# Patient Record
Sex: Female | Born: 1935 | Race: White | Hispanic: No | Smoking: Never smoker
Health system: Southern US, Community
[De-identification: ages and names within clinical notes are randomized; demographics above are authoritative.]

## PROBLEM LIST (undated history)

## (undated) DIAGNOSIS — F329 Major depressive disorder, single episode, unspecified: Secondary | ICD-10-CM

## (undated) DIAGNOSIS — M545 Low back pain, unspecified: Secondary | ICD-10-CM

## (undated) DIAGNOSIS — E785 Hyperlipidemia, unspecified: Secondary | ICD-10-CM

## (undated) DIAGNOSIS — K219 Gastro-esophageal reflux disease without esophagitis: Secondary | ICD-10-CM

## (undated) DIAGNOSIS — F32A Depression, unspecified: Secondary | ICD-10-CM

## (undated) DIAGNOSIS — I10 Essential (primary) hypertension: Secondary | ICD-10-CM

## (undated) DIAGNOSIS — R112 Nausea with vomiting, unspecified: Secondary | ICD-10-CM

## (undated) DIAGNOSIS — Z9889 Other specified postprocedural states: Secondary | ICD-10-CM

## (undated) DIAGNOSIS — G8929 Other chronic pain: Secondary | ICD-10-CM

## (undated) DIAGNOSIS — M1990 Unspecified osteoarthritis, unspecified site: Secondary | ICD-10-CM

## (undated) HISTORY — DX: Low back pain, unspecified: M54.50

## (undated) HISTORY — DX: Unspecified osteoarthritis, unspecified site: M19.90

## (undated) HISTORY — DX: Other chronic pain: G89.29

## (undated) HISTORY — DX: Major depressive disorder, single episode, unspecified: F32.9

## (undated) HISTORY — PX: EYE SURGERY: SHX253

## (undated) HISTORY — DX: Essential (primary) hypertension: I10

## (undated) HISTORY — DX: Depression, unspecified: F32.A

## (undated) HISTORY — PX: BACK SURGERY: SHX140

## (undated) HISTORY — DX: Low back pain: M54.5

## (undated) HISTORY — PX: KNEE SURGERY: SHX244

## (undated) HISTORY — DX: Hyperlipidemia, unspecified: E78.5

---

## 2005-07-25 ENCOUNTER — Ambulatory Visit: Payer: Self-pay | Admitting: Internal Medicine

## 2005-07-26 ENCOUNTER — Ambulatory Visit: Payer: Self-pay | Admitting: Internal Medicine

## 2005-07-26 LAB — CONVERTED CEMR LAB: TSH: 3.17 microintl units/mL

## 2005-08-08 ENCOUNTER — Ambulatory Visit: Payer: Self-pay | Admitting: Internal Medicine

## 2005-08-29 ENCOUNTER — Ambulatory Visit: Payer: Self-pay | Admitting: Internal Medicine

## 2005-09-07 ENCOUNTER — Ambulatory Visit: Payer: Self-pay | Admitting: Internal Medicine

## 2006-01-03 ENCOUNTER — Ambulatory Visit: Payer: Self-pay | Admitting: Internal Medicine

## 2006-01-21 ENCOUNTER — Ambulatory Visit: Payer: Self-pay | Admitting: Internal Medicine

## 2006-01-21 ENCOUNTER — Inpatient Hospital Stay (HOSPITAL_COMMUNITY): Admission: RE | Admit: 2006-01-21 | Discharge: 2006-01-24 | Payer: Self-pay | Admitting: Orthopedic Surgery

## 2006-01-31 ENCOUNTER — Ambulatory Visit: Payer: Self-pay | Admitting: Internal Medicine

## 2006-02-14 ENCOUNTER — Encounter: Admission: RE | Admit: 2006-02-14 | Discharge: 2006-03-13 | Payer: Self-pay | Admitting: Orthopedic Surgery

## 2006-02-28 ENCOUNTER — Ambulatory Visit: Payer: Self-pay | Admitting: Internal Medicine

## 2006-02-28 LAB — CONVERTED CEMR LAB
ALT: 19 units/L (ref 0–40)
BUN: 20 mg/dL (ref 6–23)
Calcium: 9.8 mg/dL (ref 8.4–10.5)
Chloride: 108 meq/L (ref 96–112)
Chol/HDL Ratio, serum: 5.1
Cholesterol: 216 mg/dL (ref 0–200)
GFR calc non Af Amer: 85 mL/min
Glomerular Filtration Rate, Af Am: 103 mL/min/{1.73_m2}
Glucose, Bld: 97 mg/dL (ref 70–99)
LDL DIRECT: 132.5 mg/dL
VLDL: 24 mg/dL (ref 0–40)

## 2006-03-05 ENCOUNTER — Ambulatory Visit: Payer: Self-pay | Admitting: Internal Medicine

## 2006-05-16 ENCOUNTER — Ambulatory Visit: Payer: Self-pay | Admitting: Internal Medicine

## 2006-05-16 LAB — CONVERTED CEMR LAB
Basophils Relative: 1 % (ref 0.0–1.0)
Eosinophils Absolute: 0.3 10*3/uL (ref 0.0–0.6)
Lymphocytes Relative: 32.9 % (ref 12.0–46.0)
Monocytes Relative: 14.6 % — ABNORMAL HIGH (ref 3.0–11.0)
Neutro Abs: 2.3 10*3/uL (ref 1.4–7.7)
Platelets: 258 10*3/uL (ref 150–400)
RBC: 4.3 M/uL (ref 3.87–5.11)

## 2006-05-22 ENCOUNTER — Ambulatory Visit: Payer: Self-pay | Admitting: Internal Medicine

## 2006-08-01 ENCOUNTER — Encounter: Payer: Self-pay | Admitting: Internal Medicine

## 2006-08-01 LAB — CONVERTED CEMR LAB: aPTT: 22.2 s

## 2006-08-16 ENCOUNTER — Ambulatory Visit: Payer: Self-pay | Admitting: Internal Medicine

## 2006-08-16 LAB — CONVERTED CEMR LAB
AST: 24 units/L (ref 0–37)
Bilirubin, Direct: 0.1 mg/dL (ref 0.0–0.3)
Chloride: 107 meq/L (ref 96–112)
Cholesterol: 191 mg/dL (ref 0–200)
Creatinine, Ser: 0.8 mg/dL (ref 0.4–1.2)
Glucose, Bld: 95 mg/dL (ref 70–99)
HDL: 46.2 mg/dL (ref >39.0)
Hgb A1c MFr Bld: 5.5 % (ref 4.6–6.0)
LDL Cholesterol: 120 mg/dL — ABNORMAL HIGH (ref 0–99)
Sodium: 139 meq/L (ref 135–145)
Total Bilirubin: 0.6 mg/dL (ref 0.3–1.2)
Total Protein: 7.2 g/dL (ref 6.0–8.3)
Triglycerides: 123 mg/dL (ref 0–149)

## 2006-08-21 ENCOUNTER — Ambulatory Visit (HOSPITAL_COMMUNITY): Admission: RE | Admit: 2006-08-21 | Discharge: 2006-08-21 | Payer: Self-pay | Admitting: Internal Medicine

## 2006-08-21 ENCOUNTER — Ambulatory Visit: Payer: Self-pay | Admitting: Internal Medicine

## 2006-09-12 ENCOUNTER — Ambulatory Visit: Payer: Self-pay | Admitting: Family Medicine

## 2006-10-03 ENCOUNTER — Ambulatory Visit: Payer: Self-pay | Admitting: Internal Medicine

## 2006-11-19 ENCOUNTER — Ambulatory Visit: Payer: Self-pay | Admitting: Internal Medicine

## 2007-01-17 ENCOUNTER — Encounter: Payer: Self-pay | Admitting: Internal Medicine

## 2007-01-17 DIAGNOSIS — M1990 Unspecified osteoarthritis, unspecified site: Secondary | ICD-10-CM | POA: Insufficient documentation

## 2007-01-17 DIAGNOSIS — I10 Essential (primary) hypertension: Secondary | ICD-10-CM | POA: Insufficient documentation

## 2007-01-17 DIAGNOSIS — F329 Major depressive disorder, single episode, unspecified: Secondary | ICD-10-CM | POA: Insufficient documentation

## 2007-01-17 DIAGNOSIS — E785 Hyperlipidemia, unspecified: Secondary | ICD-10-CM

## 2007-02-04 ENCOUNTER — Ambulatory Visit: Payer: Self-pay | Admitting: Internal Medicine

## 2007-03-12 ENCOUNTER — Telehealth: Payer: Self-pay | Admitting: Internal Medicine

## 2007-05-09 ENCOUNTER — Ambulatory Visit: Payer: Self-pay | Admitting: Internal Medicine

## 2007-05-09 DIAGNOSIS — J069 Acute upper respiratory infection, unspecified: Secondary | ICD-10-CM | POA: Insufficient documentation

## 2007-06-26 ENCOUNTER — Telehealth: Payer: Self-pay | Admitting: Internal Medicine

## 2007-10-03 ENCOUNTER — Ambulatory Visit: Payer: Self-pay | Admitting: Internal Medicine

## 2007-12-01 ENCOUNTER — Ambulatory Visit: Payer: Self-pay | Admitting: Internal Medicine

## 2007-12-01 DIAGNOSIS — R5383 Other fatigue: Secondary | ICD-10-CM

## 2007-12-01 DIAGNOSIS — R3915 Urgency of urination: Secondary | ICD-10-CM

## 2007-12-01 DIAGNOSIS — R5381 Other malaise: Secondary | ICD-10-CM | POA: Insufficient documentation

## 2007-12-01 LAB — CONVERTED CEMR LAB
Glucose, Urine, Semiquant: NEGATIVE
Ketones, urine, test strip: NEGATIVE
Urobilinogen, UA: 0.2
pH: 5

## 2007-12-30 ENCOUNTER — Ambulatory Visit: Payer: Self-pay | Admitting: Internal Medicine

## 2007-12-30 LAB — CONVERTED CEMR LAB
Basophils Absolute: 0.1 10*3/uL (ref 0.0–0.1)
Basophils Relative: 1.1 % (ref 0.0–3.0)
CO2: 26 meq/L (ref 19–32)
Chloride: 109 meq/L (ref 96–112)
Creatinine, Ser: 0.8 mg/dL (ref 0.4–1.2)
Lymphocytes Relative: 38.5 % (ref 12.0–46.0)
MCHC: 33.8 g/dL (ref 30.0–36.0)
Neutrophils Relative %: 38.7 % — ABNORMAL LOW (ref 43.0–77.0)
RBC: 4.14 M/uL (ref 3.87–5.11)
TSH: 4.13 microintl units/mL (ref 0.35–5.50)
Vitamin B-12: 256 pg/mL (ref 211–911)
WBC: 5.3 10*3/uL (ref 4.5–10.5)

## 2008-01-05 ENCOUNTER — Ambulatory Visit: Payer: Self-pay | Admitting: Internal Medicine

## 2008-01-05 DIAGNOSIS — M545 Low back pain: Secondary | ICD-10-CM

## 2008-01-06 ENCOUNTER — Ambulatory Visit: Payer: Self-pay | Admitting: Internal Medicine

## 2008-01-06 LAB — CONVERTED CEMR LAB
Thyroglobulin Ab: 31.6
Thyroperoxidase Ab SerPl-aCnc: <25

## 2008-01-09 ENCOUNTER — Telehealth: Payer: Self-pay | Admitting: Internal Medicine

## 2008-07-19 ENCOUNTER — Ambulatory Visit: Payer: Self-pay | Admitting: Internal Medicine

## 2008-07-19 LAB — CONVERTED CEMR LAB
Calcium: 9.5 mg/dL (ref 8.4–10.5)
Chloride: 107 meq/L (ref 96–112)
Creatinine, Ser: 0.7 mg/dL (ref 0.4–1.2)
GFR calc non Af Amer: 84.78 mL/min (ref >60)

## 2008-07-20 ENCOUNTER — Telehealth: Payer: Self-pay | Admitting: Internal Medicine

## 2008-08-20 ENCOUNTER — Telehealth: Payer: Self-pay | Admitting: Internal Medicine

## 2008-08-20 DIAGNOSIS — H9190 Unspecified hearing loss, unspecified ear: Secondary | ICD-10-CM | POA: Insufficient documentation

## 2008-09-01 ENCOUNTER — Encounter: Payer: Self-pay | Admitting: Internal Medicine

## 2008-09-13 ENCOUNTER — Ambulatory Visit: Payer: Self-pay | Admitting: Internal Medicine

## 2008-09-13 LAB — CONVERTED CEMR LAB
ALT: 17 units/L (ref 0–35)
AST: 26 units/L (ref 0–37)
Albumin: 3.8 g/dL (ref 3.5–5.2)
Alkaline Phosphatase: 77 units/L (ref 39–117)
Cholesterol: 150 mg/dL (ref 0–200)
Total Protein: 7.6 g/dL (ref 6.0–8.3)
Triglycerides: 145 mg/dL (ref 0.0–149.0)

## 2008-09-21 ENCOUNTER — Ambulatory Visit: Payer: Self-pay | Admitting: Internal Medicine

## 2008-09-21 LAB — HM COLONOSCOPY

## 2009-03-10 ENCOUNTER — Ambulatory Visit: Payer: Self-pay | Admitting: Internal Medicine

## 2009-03-10 DIAGNOSIS — H66009 Acute suppurative otitis media without spontaneous rupture of ear drum, unspecified ear: Secondary | ICD-10-CM | POA: Insufficient documentation

## 2009-03-11 ENCOUNTER — Encounter: Payer: Self-pay | Admitting: Internal Medicine

## 2009-03-14 ENCOUNTER — Ambulatory Visit: Payer: Self-pay | Admitting: Internal Medicine

## 2009-03-14 LAB — CONVERTED CEMR LAB
BUN: 23 mg/dL (ref 6–23)
Creatinine, Ser: 0.77 mg/dL (ref 0.40–1.20)
TSH: 3.37 microintl units/mL (ref 0.350–4.500)

## 2009-03-15 ENCOUNTER — Encounter: Payer: Self-pay | Admitting: Internal Medicine

## 2009-03-21 ENCOUNTER — Telehealth: Payer: Self-pay | Admitting: Internal Medicine

## 2009-11-14 ENCOUNTER — Telehealth: Payer: Self-pay | Admitting: Internal Medicine

## 2009-11-17 ENCOUNTER — Encounter: Payer: Self-pay | Admitting: Internal Medicine

## 2009-11-17 LAB — CONVERTED CEMR LAB
Calcium: 9.4 mg/dL (ref 8.4–10.5)
Creatinine, Ser: 0.76 mg/dL (ref 0.40–1.20)
Glucose, Bld: 97 mg/dL (ref 70–99)
Sodium: 141 meq/L (ref 135–145)
TSH: 3.745 microintl units/mL (ref 0.350–4.500)

## 2009-11-22 ENCOUNTER — Ambulatory Visit: Payer: Self-pay | Admitting: Internal Medicine

## 2009-11-22 LAB — HM MAMMOGRAPHY

## 2010-03-02 ENCOUNTER — Telehealth: Payer: Self-pay | Admitting: Internal Medicine

## 2010-04-07 ENCOUNTER — Telehealth: Payer: Self-pay | Admitting: Internal Medicine

## 2010-05-23 NOTE — Assessment & Plan Note (Signed)
Summary: 6 month follow up / tf,cma   Vital Signs:  Patient profile:   77 year old female Weight:      177.25 pounds BMI:     29.60 O2 Sat:      97 % on Room air Temp:     98.1 degrees F oral Pulse rate:   61 / minute Pulse rhythm:   regular Resp:     18 per minute BP sitting:   124 / 70  (right arm) Cuff size:   large  Vitals Entered By: Glendell Docker CMA (November 22, 2009 10:35 AM)  O2 Flow:  Room air  Contraindications/Deferment of Procedures/Staging:    Test/Procedure: Mammogram    Reason for deferment: patient declined     Test/Procedure: PAP Smear    Reason for deferment: patient declined  CC: Rm 2- 6 Month Follow up  Is Patient Diabetic? No Pain Assessment Patient in pain? no       Does patient need assistance? Functional Status Self care Ambulation Normal Comments no concerns     Last PAP Result Declined   Primary Care Provider:  DThomos Lemons DO  CC:  Rm 2- 6 Month Follow up .  History of Present Illness:  Hypertension Follow-Up      This is an 77 year old woman who presents for Hypertension follow-up.  The patient denies lightheadedness and headaches.  The patient denies the following associated symptoms: chest pain.  Compliance with medications (by patient report) has been near 100%.  The patient reports that dietary compliance has been fair.    hyperlipidemia - stable  Preventive Screening-Counseling & Management  Alcohol-Tobacco     Smoking Status: never  Allergies (verified): No Known Drug Allergies  Past History:  Past Medical History: Depression Hyperlipidemia Hypertension    Osteoarthritis  Chronic low back pain.     Past Surgical History: Back surgery Left Knee replacement       Family History: Brother deceaseds  -lung cancer Mother deceased at age 39 secondary to old age.  Father deceased at age 69, and he had a stroke.  The patient's son died approximately 1 year ago secondary to pulmonary fibrosis, and the patient has  1 brother who has also osteoarthritis.       Daughter in law diagnosed with "bone cancer"  Physical Exam  General:  alert, well-developed, and well-nourished.   Head:  normocephalic and atraumatic.   Eyes:  pupils equal, pupils round, and pupils reactive to light.   Ears:  R ear normal and L ear normal.   Lungs:  normal respiratory effort, normal breath sounds, no crackles, and no wheezes.   Heart:  normal rate, regular rhythm, and no gallop.   Extremities:  No lower extremity edema  Neurologic:  cranial nerves II-XII intact and gait normal.     Impression & Recommendations:  Problem # 1:  HYPERTENSION (ICD-401.9) Assessment Unchanged  Her updated medication list for this problem includes:    Ziac 2.5-6.25 Mg Tabs (Bisoprolol-hydrochlorothiazide) ..... One by mouth qd  BP today: 124/70 Prior BP: 90/50 (03/10/2009)  Labs Reviewed: K+: 4.3 (03/14/2009) Creat: : 0.77 (03/14/2009)   Chol: 150 (09/13/2008)   HDL: 39.10 (09/13/2008)   LDL: 82 (09/13/2008)   TG: 145.0 (09/13/2008)  Problem # 2:  HYPERLIPIDEMIA (ICD-272.4)  Her updated medication list for this problem includes:    Pravastatin Sodium 20 Mg Tabs (Pravastatin sodium) ..... One by mouth at bedtime  Labs Reviewed: SGOT: 26 (09/13/2008)   SGPT:  17 (09/13/2008)   HDL:39.10 (09/13/2008), 46.2 (08/16/2006)  LDL:82 (09/13/2008), 120 (04/54/0981)  Chol:150 (09/13/2008), 191 (08/16/2006)  Trig:145.0 (09/13/2008), 123 (08/16/2006)  Complete Medication List: 1)  Zoloft 25 Mg Tabs (Sertraline hcl) .... One by mouth once daily 2)  Pravastatin Sodium 20 Mg Tabs (Pravastatin sodium) .... One by mouth at bedtime 3)  Ziac 2.5-6.25 Mg Tabs (Bisoprolol-hydrochlorothiazide) .... One by mouth qd  Patient Instructions: 1)  Please schedule a follow-up appointment in 1 year. 2)  BMP prior to visit, ICD-9:  401.9 3)  Hepatic Panel prior to visit, ICD-9: 272.4 4)  Lipid Panel prior to visit, ICD-9: 272.4 5)  TSH prior to visit, ICD-9:  272.4 6)  Please return for lab work one (1) week before your next appointment.  Prescriptions: ZIAC 2.5-6.25 MG  TABS (BISOPROLOL-HYDROCHLOROTHIAZIDE) one by mouth qd  #90 x 3   Entered and Authorized by:   D. Thomos Lemons DO   Signed by:   D. Thomos Lemons DO on 11/22/2009   Method used:   [facility]        [facility] [phone][facility] (retail)       [address]       [place], [place]  [address]       Ph: [phone]       Fax: [phone]   RxID:   [id number] PRAVASTATIN SODIUM 20 MG  TABS (PRAVASTATIN SODIUM) one by mouth at bedtime  #90 x 3   Entered and Authorized by:   D. Thomos Lemons DO   Signed by:   D. Thomos Lemons DO on 11/22/2009   Method used:   [facility]ronically to        [facility] [phone]* (retail)       [address]       [place], [place]  [address]       Ph: [phone]       Fax: [phone]   RxID:   [id number] ZOLOFT 25 MG  TABS (SERTRALINE HCL) one by mouth once daily  #90 x 3   Entered and Authorized by:   D. Thomos Lemons DO   Signed by:   D. Thomos Lemons DO on 11/22/2009   Method used:   Electronically to        [facility] [phone]* (retail)       [address]       [place], [place]  [address]       Ph: [phone]       Fax: [phone]   RxID:   [id number]   Current Allergies (reviewed today): No known allergies    Preventive Care Screening  Mammogram:    Date:  11/22/2009    Results:  Declined  Pap Smear:    Date:  11/22/2009    Results:  Declined

## 2010-05-23 NOTE — Progress Notes (Signed)
Summary: Pt is requesting meds   Phone Note Call from Patient Call back at Home Phone [phone] Call back at [phone]   Caller: Patient Reason for Call: Talk to Nurse Summary of Call: Pt states her daughter only has a few days to live & she needs meds for her nerves, can something be sent to the pharmacy for her?  Initial call taken by: Lannette Donath,  March 02, 2010 2:58 PM  Follow-up for Phone Call        Spoke to pt and she states that she would like something to help calm her nerves. She states she has cried all day. Pt is still taking Zoloft 25mg  1 once daily. Please advise. Nicki Guadalajara Fergerson CMA Duncan Dull)  March 02, 2010 3:15 PM   Additional Follow-up for Phone Call Additional follow up Details #1::        increase sertraline to 50 mg. please call in alprazolam 0.25 mg see rx  needs appt within 1 week Additional Follow-up by: D. Thomos Lemons DO,  March 05, 2010 9:05 PM    Additional Follow-up for Phone Call Additional follow up Details #2::    call returned to patient at [phone], no answer. A detailed voice message was left informing patient per Dr Artist Pais instructions. She informed rx's have been phone into pharmacy,and advised to schedule a follow up in one week.  Message was left for patient to call if any questions and to schedule follow up Follow-up by: Glendell Docker CMA,  March 06, 2010 12:06 PM  New/Updated Medications: SERTRALINE HCL 50 MG TABS (SERTRALINE HCL) one by mouth once daily ALPRAZOLAM 0.25 MG TABS (ALPRAZOLAM) 1/2 to one tab by mouth two times a day as needed Prescriptions: ALPRAZOLAM 0.25 MG TABS (ALPRAZOLAM) 1/2 to one tab by mouth two times a day as needed  #30 x 0   Entered and Authorized by:   D. Thomos Lemons DO   Signed by:   D. Thomos Lemons DO on 03/05/2010   Method used:   Telephoned to ...       [facility] [phone]* (retail)       [address]       [place], [place]  [address]       Ph: [phone]  Fax: [phone]   RxID:   [id number] SERTRALINE HCL 50 MG TABS (SERTRALINE HCL) one by mouth once daily  #90 x 1   Entered and Authorized by:   D. Thomos Lemons DO   Signed by:   D. Thomos Lemons DO on 03/05/2010   Method used:   Electronically to        [facility] [phone]* (retail)       [address]       [place], [place]  [address]       Ph: [phone]       Fax: [phone]   RxID:   [id number]

## 2010-05-23 NOTE — Progress Notes (Signed)
Summary: refill--pravastatin, ziac, sertraline; needs app  Phone Note Refill Request Message from:  [facility] November 14, 2009 11:44 AM  Refills Requested: Medication #1:  PRAVASTATIN SODIUM 20 MG  TABS one by mouth at bedtime   Dosage confirmed as above?Dosage Confirmed   Supply Requested: 1 month   Last Refilled: 06/24/2009  Medication #2:  ZIAC 2.5-6.25 MG  TABS one by mouth qd   Dosage confirmed as above?Dosage Confirmed   Supply Requested: 1 month   Last Refilled: 06/24/2009  Medication #3:  ZOLOFT 25 MG  TABS one by mouth once daily   Dosage confirmed as above?Dosage Confirmed   Supply Requested: 1 month   Last Refilled: 06/24/2009 Pt last seen by Dr. Artist Pais in June 2010. Was supposed to have been seen in December for 6 month f/u.   Pt needs appt. now. Will give 30 day supply only until pt can be seen.  Left message on machine for pt to return my call.  Next Appointment Scheduled: none Initial call taken by: Mervin Kung CMA Duncan Dull),  November 14, 2009 11:45 AM  Follow-up for Phone Call        Pt advised of need for appt.  Follow up scheduled for 11/22/09 @ 10:15 with Dr. Artist Pais.  Pt will come fasting for lab work this week.  Order faxed to lab.  Nicki Guadalajara Fergerson CMA Duncan Dull)  November 15, 2009 8:53 AM     Prescriptions: PRAVASTATIN SODIUM 20 MG  TABS (PRAVASTATIN SODIUM) one by mouth at bedtime  #30 Tablet x 0   Entered by:   Mervin Kung CMA (AAMA)   Authorized by:   D. Thomos Lemons DO   Signed by:   Mervin Kung CMA (AAMA) on 11/14/2009   Method used:   Electronically to        [facility] [phone]* (retail)       [address]       [place], [place]  [address]       Ph: [phone]       Fax: [phone]   RxID:   [id number] ZIAC 2.5-6.25 MG  TABS (BISOPROLOL-HYDROCHLOROTHIAZIDE) one by mouth qd  #30 Tablet x 0   Entered by:   Mervin Kung CMA (AAMA)   Authorized by:   D. Thomos Lemons DO   Signed by:   Mervin Kung CMA  (AAMA) on 11/14/2009   Method used:   Electronically to        [facility] [phone]* (retail)       [address]       [place], [place]  [address]       Ph: [phone]       Fax: [phone]   RxID:   [id number] ZOLOFT 25 MG  TABS (SERTRALINE HCL) one by mouth once daily  #30 Tablet x 0   Entered by:   Mervin Kung CMA (AAMA)   Authorized by:   D. Thomos Lemons DO   Signed by:   Mervin Kung CMA (AAMA) on 11/14/2009   Method used:   Electronically to        [facility] [phone]* (retail)       [address]       [place], [place]  [address]       Ph: [phone]       Fax:  [phone]   RxID:   [id number]

## 2010-05-25 NOTE — Progress Notes (Signed)
Summary: Ziac Refill  Phone Note Refill Request Message from:  Fax from Pharmacy on April 07, 2010 10:27 AM  Refills Requested: Medication #1:  bisoprolol-hctz 2.5 6.25 mg tab   Dosage confirmed as above?Dosage Confirmed   Brand Name Necessary? No   Supply Requested: 1 month   Last Refilled: 06/24/2009 [facility] [address] [place] [address] fax [phone]   Method Requested: Electronic Next Appointment Scheduled: none Initial call taken by: Roselle Locus,  April 07, 2010 10:29 AM  Follow-up for Phone Call        Rx completed in Dr. Tiajuana Amass Follow-up by: Glendell Docker CMA,  April 07, 2010 12:03 PM    Prescriptions: ZIAC 2.5-6.25 MG  TABS (BISOPROLOL-HYDROCHLOROTHIAZIDE) one by mouth qd  #30 x 1   Entered by:   Glendell Docker CMA   Authorized by:   D. Thomos Lemons DO   Signed by:   Glendell Docker CMA on 04/07/2010   Method used:   Electronically to        [facility] [phone]* (retail)       [address]       [place], [place]  [address]       Ph: [phone]       Fax: [phone]   RxID:   [id number]

## 2010-05-29 ENCOUNTER — Telehealth: Payer: Self-pay | Admitting: Internal Medicine

## 2010-06-01 ENCOUNTER — Encounter: Payer: Self-pay | Admitting: Internal Medicine

## 2010-06-08 NOTE — Progress Notes (Signed)
Summary: Jury Duty Letter  Phone Note Call from Patient   Caller: pt nephew Call For: darlene Summary of Call: pt was called for jury duty and wants to get out of it due to hard of hearing. nephew,steve brown, called and asked it there was a letter that dr.Nikiesha Milford could fill out for her. steve brown cell# [phone]. please assist. Initial call taken by: Elba Barman,  May 29, 2010 10:30 AM  Follow-up for Phone Call        call placed to [phone], no answer. A detailed voice message was left for patient to return call regarding jury duty letter. He was asked to provide a copy of jury duty letter, or call back with Juror number, county of residence and date she is scheduled to appear. Follow-up by: Glendell Docker CMA,  May 31, 2010 2:45 PM  Additional Follow-up for Phone Call Additional follow up Details #1::        patients Nephew returned phone call and stated patients Juror number is [phone], it is for [facility],  and she is scheduled for March 7th at 8:15 a.m. Additional Follow-up by: Glendell Docker CMA,  May 31, 2010 4:21 PM    Additional Follow-up for Phone Call Additional follow up Details #2::    ok to generate letter excusing pt from jury duty due hearing loss Follow-up by: D. Thomos Lemons DO,  June 01, 2010 12:14 PM  Additional Follow-up for Phone Call Additional follow up Details #3:: Details for Additional Follow-up Action Taken: Letter printed and forwarded to Provider for signature. Nicki Guadalajara Fergerson CMA Duncan Dull)  June 01, 2010 1:42 PM   call placed to patients Serita Kyle [phone] , no answer, a voice message was left informing him letter mailed to address on file. Additional Follow-up by: Glendell Docker CMA,  June 02, 2010 10:08 AM

## 2010-06-08 NOTE — Letter (Signed)
Summary: Tourist information centre manager at [facility]  [address]   [place], [place] [address]   Phone: [phone]  Fax: [phone]     June 01, 2010  Chief [address] [place]  [address]  [place], [place] [address]  VH:QIONGEX TAKESHITA  Juror: #_[phone]_____   Dear Milford Cage:   Truett Main[place] of ____[place]______[place],__[place]_____, [facility] has just informed me that she has been chosen to serve on the jury beginning the __7th__day of __March__, __2012_.  Mrs. Laitinen is a patient under my care with the diagnosos of _hearing loss_. I do not feel that she should serve on the jury and am requesting that she be excused from jury duty permanently.   Your consideration of this matter is greatly appreciated.  Respectfully,     Thomos Lemons, DO

## 2010-08-12 ENCOUNTER — Encounter: Payer: Self-pay | Admitting: Internal Medicine

## 2010-08-14 ENCOUNTER — Ambulatory Visit (HOSPITAL_BASED_OUTPATIENT_CLINIC_OR_DEPARTMENT_OTHER)
Admission: RE | Admit: 2010-08-14 | Discharge: 2010-08-14 | Disposition: A | Discharge status: Home / Self Care | Payer: Medicare Other | Source: Ambulatory Visit | Attending: Internal Medicine | Admitting: Internal Medicine

## 2010-08-14 ENCOUNTER — Ambulatory Visit (INDEPENDENT_AMBULATORY_CARE_PROVIDER_SITE_OTHER): Payer: Medicare Other | Admitting: Internal Medicine

## 2010-08-14 ENCOUNTER — Encounter: Payer: Self-pay | Admitting: Internal Medicine

## 2010-08-14 ENCOUNTER — Inpatient Hospital Stay (HOSPITAL_BASED_OUTPATIENT_CLINIC_OR_DEPARTMENT_OTHER)
Admission: RE | Admit: 2010-08-14 | Discharge: 2010-08-14 | Disposition: A | Discharge status: Home / Self Care | Payer: Medicare Other | Source: Ambulatory Visit | Attending: Internal Medicine | Admitting: Internal Medicine

## 2010-08-14 DIAGNOSIS — M25512 Pain in left shoulder: Secondary | ICD-10-CM

## 2010-08-14 DIAGNOSIS — M2550 Pain in unspecified joint: Secondary | ICD-10-CM | POA: Insufficient documentation

## 2010-08-14 DIAGNOSIS — M25519 Pain in unspecified shoulder: Secondary | ICD-10-CM | POA: Insufficient documentation

## 2010-08-14 DIAGNOSIS — M19019 Primary osteoarthritis, unspecified shoulder: Secondary | ICD-10-CM | POA: Insufficient documentation

## 2010-08-14 DIAGNOSIS — M25549 Pain in joints of unspecified hand: Secondary | ICD-10-CM

## 2010-08-14 LAB — CBC WITH DIFFERENTIAL/PLATELET
Basophils Relative: 0 % (ref 0–1)
HCT: 40.7 % (ref 36.0–46.0)
Hemoglobin: 13.7 g/dL (ref 12.0–15.0)
Lymphs Abs: 2.5 10*3/uL (ref 0.7–4.0)
MCHC: 33.7 g/dL (ref 30.0–36.0)
Monocytes Absolute: 0.8 10*3/uL (ref 0.1–1.0)
Monocytes Relative: 11 % (ref 3–12)
Neutro Abs: 3.7 10*3/uL (ref 1.7–7.7)
RBC: 4.44 MIL/uL (ref 3.87–5.11)

## 2010-08-14 LAB — CK: Total CK: 72 U/L (ref 7–177)

## 2010-08-14 MED ORDER — MELOXICAM 7.5 MG PO TABS: 7.5000 mg | ORAL_TABLET | Freq: Every day | ORAL | Status: DC | PRN

## 2010-08-14 NOTE — Progress Notes (Signed)
  Subjective:    Patient ID: Katherine Rubio, female    DOB: 1922/10/10, 77 y.o.   MRN: [id number]  HPI  77 year old female complains of chronic shoulder and hand pain. Her symptoms are worse in her left shoulder. Patient notes decreased range of motion. She has difficulty raising left arm above 90. Pain also seems to be worse at night laying on her left side.  Both shoulders can be symptomatic.    Patient also complains of bilateral hand pain left greater than right. No report of swollen or red joints. She has a.m. stiffness that gradually improves. She admits to occasional numb sensation in her right hand.   Review of Systems No fever or chills    Past Medical History  Diagnosis Date  . Depression   . Hyperlipidemia   . Hypertension   . Osteoarthritis   . Chronic low back pain     History   Social History  . Marital Status: Widowed    Spouse Name: N/A    Number of Children: N/A  . Years of Education: N/A   Occupational History  . Not on file.   Social History Main Topics  . Smoking status: Never Smoker   . Smokeless tobacco: Not on file  . Alcohol Use: No  . Drug Use: Not on file  . Sexually Active: Not on file   Other Topics Concern  . Not on file   Social History Narrative   WidowedRetired as a Scientist, physiological.   Supportive daughterNever SmokedAlcohol use-no     Past Surgical History  Procedure Date  . Back surgery   . Knee surgery     left knee replacement    Family History  Problem Relation Age of Onset  . Lung cancer Brother     deceased  . Stroke Father     deceased age 73  . Pulmonary fibrosis Son     deceased  . Osteoarthritis Brother     No Known Allergies  Current Outpatient Prescriptions on File Prior to Visit  Medication Sig Dispense Refill  . ALPRAZolam (XANAX) 0.25 MG tablet Take 0.25 mg by mouth. 1/2 to 1 tablet by mouth two times a day as needed       . bisoprolol-hydrochlorothiazide (ZIAC) 2.5-6.25 MG per tablet Take 1 tablet  by mouth daily.        . pravastatin (PRAVACHOL) 20 MG tablet Take 20 mg by mouth at bedtime.        . sertraline (ZOLOFT) 50 MG tablet Take 50 mg by mouth daily.          BP 130/80  Pulse 63  Temp 97.6 F (36.4 C)  Resp 20  Ht 5\' 5"  (1.651 m)  Wt 183 lb (83.008 kg)  BMI 30.45 kg/m2  SpO2 97%    Objective:   Physical Exam  Constitutional: She appears well-developed and well-nourished.  Cardiovascular: Normal rate, regular rhythm and normal heart sounds.   Pulmonary/Chest: Breath sounds normal.  Musculoskeletal:       Decreased range of motion of left shoulder, no a.c. joint tenderness, negative impingement  Hand exam-no obvious deformity.  No Heberden's or Bouchard's nodes, atrophy of thenar eminence right greater than left          Assessment & Plan:

## 2010-08-14 NOTE — Assessment & Plan Note (Signed)
Probable DJD Question left shoulder bursitis X ray left shoulder Use meloxicam as directed Pt notes both shoulder can be symptomatic.  Rule out PMR.  Check sed rate. F/U with ortho to see if she is candidate for cortisone injection

## 2010-08-14 NOTE — Assessment & Plan Note (Signed)
77 year old female with bilateral hand pain. I doubt inflammatory arthritis.  . Question possible carpal tunnel syndrome. Obtain x-ray of left hand since it is more symptomatic. Check uric acid, and sed rate Refer to Dr. Teressa Senter for further evaluation and treatment Use meloxicam as directed

## 2010-08-15 ENCOUNTER — Encounter: Payer: Self-pay | Admitting: Internal Medicine

## 2010-09-08 NOTE — Op Note (Signed)
NAMEAASHNA, MATSON           ACCOUNT NO.:  [id number]   MEDICAL RECORD NO.:  [id number]          PATIENT TYPE:  INP   LOCATION:  5008                         FACILITY:  [facility]   PHYSICIAN:  [name]. Sherlean Foot, M.D. DATE OF BIRTH:  02/14/1923   DATE OF PROCEDURE:  01/21/2006  DATE OF DISCHARGE:                                 OPERATIVE REPORT   SURGEON:  [name]. Sherlean Foot, M.D.   ASSISTANT:  Rexene Edison, P.A.   ANESTHESIA:  General.   PREOPERATIVE DIAGNOSIS:  Left  knee osteoarthritis.   POSTOPERATIVE DIAGNOSIS:  Left knee osteoarthritis.   PROCEDURE:  Left medial compartmental arthroplasty of the knee.   INDICATIONS FOR PROCEDURE:  Patient is an 77 year old white female who  failed conservative measures for osteoarthritis of the knee and with pure  medial compartment osteoarthritis.  Informed consent was obtained.   DESCRIPTION OF PROCEDURE:  The patient was laid supine.  Administered spinal  anesthesia, then placed in supine position with the left leg tourniquet pre-  applied.  The left leg was prepped and draped in the usual sterile fashion.  A midline incision was made from the inferior pole of patella to the tibial  tubercle.  A fresh blade was used make an arthrotomy and remove the  retropatellar fat pad in the medial compartment.  I then elevated the deep  MCL off the medial crest of the tibia and removed the medial meniscus.  I  then used the extramedullary device on the tibia and extended open the  medial compartment with thumb screw and then pinned it into place.  I then  used the extramedullary rods to ensure that we were through the mechanical  axis.  I then pinned it and cut the distal femoral cut and the proximal  tibial cut through the slot.  I then placed the 10-mm spacer block in place,  took one further C-arm shot to ensure that we had good extension gap.  Then  went into flexion and used the size E femoral trial, drilling for the lugs,  cutting for the  chamfers and posterior condylar cut.  Then used the size 3  tibia, drilled and keeled flat.  Then trialed with different polyethylene  trials and chose a size 12.  This afforded excellent flexion-extension gap  balance.  I then removed the trial components, copiously irrigated, and then  cemented in the components, removing excess cement, cementing in the real  polyethylene and leaving it in extension for the cement to harden.  I then  copiously irrigated again.  I then let the tourniquet down.  This was at 48  minutes.  I then closed the arthrotomy with interrupted #1 Vicryl sutures.  Infiltrated the arthrotomy with 30 cc of 0.5 Marcaine/morphine mixture.  I  then closed the deep soft tissues with  0 Vicryl, subcuticular with running  2-0 Vicryl, and skin with staples.  Dressed with Xeroform dressing, sponges,  sterile Webril, and TED stocking.   COMPLICATIONS:  None.   DRAINS:  One Hemovac.           ______________________________  [name]. Sherlean Foot, M.D.  SDL/MEDQ  D:  01/21/2006  T:  01/22/2006  Job:  [id number]

## 2010-09-08 NOTE — Discharge Summary (Signed)
Katherine Rubio, Katherine Rubio           ACCOUNT NO.:  [id number]   MEDICAL RECORD NO.:  [id number]          PATIENT TYPE:  INP   LOCATION:  5008                         FACILITY:  [facility]   PHYSICIAN:  [name]. Sherlean Foot, M.D. DATE OF BIRTH:  11/18/1922   DATE OF ADMISSION:  01/21/2006  DATE OF DISCHARGE:  01/24/2006                                 DISCHARGE SUMMARY   ADMISSION DIAGNOSIS:  Osteoarthritis of the right knee.   DISCHARGE DIAGNOSES:  1. Osteoarthritis of the right knee.  2. Hypertension.  3. Hyperlipidemia.  4. Hypokalemia.  5. Hypotension.   PROCEDURE:  Left intracompartmental knee replacement.   HISTORY:  Ms. Stallman is an 77 year old white female with left knee pain  for two years.  She has no history of injury at all.  She has intermittent  pain in the left knee with ambulation and has described as a dull aching  pain of the medial compartment.  No mechanical symptoms.  No assistive  device is used.  She has failed with conservative treatment including  physical supplementation.  X-ray showed severe intercompartment  osteoarthritis of the left knee.  She is indicated at this time for left  intracompartmental knee replacement.   HOSPITAL COURSE:  An 77 year old female admitted 01/21/2006 after  appropriate laboratory studies were obtained as well as 1 gram Ancef IV on  call to the operating room.  She was taken to the operating room where she  underwent a left intracompartmental knee replacement.  She tolerated the  procedure well.  She was continued on Ancef 1 gram IV q8 h. x3 doses.  Morphine 2 mg IV q.3-4 h. was used postoperatively.  She was started on  Lovenox 30 mg subcutaneous q.12 h. starting on January 22, 2006 at 8 a.m. CMP  0 to 90 degrees was started.  Consultation  with PT, OT and care management  was made.  Weightbearing as tolerated.  Foley was placed intraoperatively.   She was placed on 400 mg of Celebrex postoperatively and 200 mg p.o. on a  daily  basis.  Robaxin 500 mg p.o. q.6-8 h. p.r.n. spasms was ordered.  She  did have some problems with hypotension, and all blood pressure medicines  were held for systolic less than 120 mmHg.  IV fluids were increased to 100  per hour for 1 liter and then back to 75 per hour.  Robaxin and Percocet  were stopped.  Vicodin 5/500 one p.o. q.4 h. p.r.n. for pain was started.  K-  Dur 20 mEq p.o. b.i.d. was started for hypokalemia.  Foley was left in.   There was a workup by Medicine which included a 12-lead EKG.  H&H, cardiac  enzymes,  was also ordered.  Her Ultram 50 mg 1 to 2 tablets q.6 h. was  used.  K-Dur was discontinued on October 3.  A pulmonary toilet was  requested.  The Foley was discontinued on October 3 also.  PT b.i.d. was  ordered on October 4, and she was discharged to home to follow back up with  [place] in two weeks for recheck evaluation.  EKG was read as  sinus bradycardia,  otherwise normal EKG on October 1.  October 2 revealed normal sinus rhythm,  normal EKG.   RADIOGRAPHIC STUDIES:  Left knee revealed a single intraoperative frontal of  the knee is submitted.  There is a radiopaque spacer at the medial tibial  plateau.   LABORATORY STUDIES:  Admitted with a hemoglobin of 13.7, hematocrit 39.9%,  white count 6800, platelets 273,000.  Discharge hemoglobin 10.5, hematocrit  30.9%, white count 8000, platelets 469,000.  Preoperative sodium 138,  potassium 4.2, chloride 103, CO2 25, glucose 99, BUN 19, creatinine 0.8,  calcium 9.9, total protein 4.3, albumin 3.7, AST 26, ALT 15, ALP 69, total  bilirubin 0.7.  Potassium was 3.7 on October 2.  Discharge sodium 137,  potassium 4.3, chloride 110, CO2 24, glucose 105, BUN 13, creatinine 0.8,  calcium was 8.4.  CPK 84, MB 1.1, troponin 0.01.  Urinalysis was benign for  blood in urine.  Blood type is A positive, antibody screen negative.   DISCHARGE INSTRUCTIONS:  There are no restrictions in her diet.  She will do  no driving or lifting  for six weeks.  Use a walker, ambulating weightbearing  as tolerated.  Keep her incision clean and dry.  Change of dressing daily.  Prescription for Norco 5/325 one to two tablets every 4 hours as needed for  pain.  Lovenox 40 mg to take daily to start at 8 a.m., Lovastatin 10 mg  daily at night, atenolol 50 mg as necessary, atenolol if BP top number is  greater than 120 and pulse at least 70 beats per minute.  She will follow  back up with Dr. Sherlean Foot on February 04, 2006, primary care in seven to ten  days.  Genevieve Norlander for her home healthcare.  She will follow the instruction  sheet.  Discharged in improved condition.      Oris Drone Petrarca, P.A.-C.    ______________________________  [name]. Sherlean Foot, M.D.    BDP/MEDQ  D:  02/22/2006  T:  02/23/2006  Job:  [id number]

## 2010-09-08 NOTE — H&P (Signed)
NAMEALYN, RIEDINGER           ACCOUNT NO.:  [id number]   MEDICAL RECORD NO.:  [id number]          PATIENT TYPE:  INP   LOCATION:  NA                           FACILITY:  [facility]   PHYSICIAN:  [name]. Sherlean Foot, M.D. DATE OF BIRTH:  [birth year]   DATE OF ADMISSION:  01/21/2006  DATE OF DISCHARGE:                                HISTORY & PHYSICAL   HISTORY OF PRESENT ILLNESS:  Katherine Rubio is an 77 year old white female  with left knee pain for two years.  No known injury.  She has intermittent  pain in the left knee with ambulation which she has described as a dull  aching pain, medial compartment.  No mechanical symptoms.  No assistive  devices.  She has failed conservative treatment which had included viscous  supplementation.  X-rays show severe medial compartment osteoarthritis of  the left knee.   ALLERGIES:  No known drug allergies. No food allergies. No metal allergies.   MEDICATIONS:  1. Zoloft 50 mg one daily.  2. Atenolol 50 mg one-half tablet daily in the A.M.  3. Pravastatin one 10 mg tablet daily to be held two to three days prior      to surgery and then resume three to five days after the surgery.  4. Tylenol two tablets twice daily.   PAST MEDICAL HISTORY:  Positive for hypertension, dyslipidemia and  osteoarthritis of the left knee.   PAST SURGICAL HISTORY:  In 1967 the patient had lumbar surgery for a  ruptured disc.  The patient denies any complications except for some  postoperative nausea with the above procedure.  No blood transfusions.   SOCIAL HISTORY:  The patient denies any tobacco or alcohol use.  She lives  alone in a one story home with one step to usual entrance.   FAMILY HISTORY:  The patient's mother deceased age 49, had a history of  hypertension.  Father deceased at age 72 due to stroke.  She has one brother  who is age 28, has hypertension and osteoarthritis of both knees, otherwise  no health issues.   REVIEW OF SYSTEMS:  She wears  glasses for reading. She has decreased  hearing in the left ear.  Hypertension.  Frequent constipation.  Pain in the  left knee.  Otherwise, the review of systems is negative or noncontributory.   PHYSICAL EXAMINATION:  GENERAL APPEARANCE:  The patient is a well-developed,  well-nourished 77 year old female who walks with a slow antalgic gait.  She  has difficulty getting on and off the examination table due to her left knee  pain.  The patient's mood and affect are appropriate.  She talks easily with  examiner.  VITAL SIGNS:  Height 5 feet 7 inches.  Weight 175 pounds.  CARDIAC:  Regular rate and rhythm, no murmurs, rubs, gallops noted.  CHEST:  Lungs are clear to auscultation bilaterally.  No wheezing, rhonchi,  rales noted.  ABDOMEN:  Soft, nontender.  Bowel sounds x4 quadrants.  HEENT:  Head is normocephalic, atraumatic without frontal or maxillary sinus  tenderness to palpation.  Pupils equal, round, reactive to light and  accommodation..  Extraocular  movements intact. Conjunctivae pink.  No  visible external ear deformities. Tympanic membranes pearly gray  bilaterally.  Nose, nasal septum midline.  Nasal mucosa is pink, moist  without polyps.  Buccal mucosa was pink and moist.  Patient has good  dentition. Pharynx without erythema or exudate.  Tongue, uvula midline.  NECK:  No lymphadenopathy.  Trachea is midline.  Carotids are 2+ bilaterally  without bruits.  No tenderness to palpation along the cervical spine.  Patient has full range of motion of the cervical spine without pain.  BACK:  No tenderness with palpation over the thoracic, lumbar vertebral  column.  BREAST, GENITOURINARY, URINARY, RECTAL:  Examination's all deferred at this  time.  NEUROLOGICAL:  Cranial nerves II-XII grossly intact.  Deep tendon reflexes  at the patella's and ankles bilaterally are equal and symmetric and are 2+.  Lower extremity strength testing reveals 5/5 strength throughout the lower   extremities.  The patient is alert and oriented x3.  MUSCULOSKELETAL:  Upper extremities are equal and symmetric in size and  shape.  She has full range of motion of the shoulders, elbows, hands,  fingers.  She has good sensation to light touch in the fingertips  throughout.  Radial pulses are 2+ bilaterally and are equal and symmetric.  Lower extremities have full range of motion of both hips without pain.  Right knee has 0-130 degrees of flexion.  Left knee negative to 130 degrees  of flexion.  Right knee is nontender to palpation.  Left knee is tender at  the medial joint line with palpation.  Valgus and varus stressing reveals no  laxity.  No effusion, no edema.  No effusions are noted in either knee.   IMPRESSION:  1. Left knee with medial compartment osteoarthritis.  2. Hypertension.  3. Dyslipidemia.  4. Decreased hearing left ear.   PLAN:  The patient is to undergo a left knee uni-arthroplasty by Dr. Sherlean Foot  on January 21, 2006.  Prior to surgery the patient is to undergo preoperative  labs and testing.  She did receive preoperative clearance from her primary  care physician, Dr. [name]. Artist Pais of [facility].      Richardean Canal, P.A.    ______________________________  [name]. Sherlean Foot, M.D.    GC/MEDQ  D:  01/18/2006  T:  01/18/2006  Job:  [id number]   cc:   [name]. Artist Pais, DO

## 2010-09-08 NOTE — Letter (Signed)
January 03, 2006     [name]. Sherlean Foot, M.D.  [address] [place], [place] [address]   RE:  BRENLYNN, FAKE  MRN:  [id number]  /  DOB:  06/24/1922   Dear Dr. Sherlean Foot:   This letter is in reference to surgical clearance for upcoming left knee  surgery for patient Katherine Rubio.  She is an 77 year old female who is  followed in our office with hypertension and dyslipidemia.  The patient  recently established with our office for primary care.  She has no history  of heart disease or any chronic lung disease.  She was recently started on  antihypertensives and has been well-controlled on Tenoretic.  In addition,  recent blood tests showed elevated cholesterol and she was started on low-  dose pravastatin, which she has tolerated without any difficulty.   She denies any history of recent chest pain, has no history of aortic  stenosis.  There is some concern of her using chronic nonsteroidal anti-  inflammatories, which she has tried to decrease and takes Extra Strength  Tylenol in its place.   Her last EKG in her chart was performed on July 25, 2005.  It showed normal  sinus rhythm at 87 beats per minute, no ST changes, no Q waves were noted.   CURRENT MEDICATIONS:  1. Zoloft 25 mg once a day.  2. Tenoretic 50/25 mg one-half tablet a day.  3. Pravastatin 10 mg q.h.s.  4. She takes Darvocet, Extra Strength Tylenol and Aleve p.r.n.   HABITS:  She does not drink or smoke.  She has a remote history of tobacco  use in the past.   In summary, the patient is an 77 year old white female treated for  hypertension and dyslipidemia.  The patient has a relatively low  cardiovascular risk for upcoming surgery.  I would not recommend any further  cardiovascular testing.  She was instructed to continue her Tenoretic on the  day of surgery.  She is to hold her pravastatin 1-2 days prior to surgery  and resume 3-5 days after her surgery.   Please contact me if you have any further  concerns.    Sincerely,      [name]. Artist Pais, DO   RDY/MedQ  DD:  01/03/2006  DT:  01/04/2006  Job #:  [id number]

## 2010-09-13 ENCOUNTER — Encounter: Payer: Self-pay | Admitting: Internal Medicine

## 2010-09-13 ENCOUNTER — Telehealth: Payer: Self-pay | Admitting: Internal Medicine

## 2010-09-13 ENCOUNTER — Ambulatory Visit (INDEPENDENT_AMBULATORY_CARE_PROVIDER_SITE_OTHER): Payer: Medicare Other | Admitting: Internal Medicine

## 2010-09-13 DIAGNOSIS — E785 Hyperlipidemia, unspecified: Secondary | ICD-10-CM

## 2010-09-13 DIAGNOSIS — M899 Disorder of bone, unspecified: Secondary | ICD-10-CM

## 2010-09-13 DIAGNOSIS — I10 Essential (primary) hypertension: Secondary | ICD-10-CM

## 2010-09-13 DIAGNOSIS — M8580 Other specified disorders of bone density and structure, unspecified site: Secondary | ICD-10-CM | POA: Insufficient documentation

## 2010-09-13 DIAGNOSIS — M1990 Unspecified osteoarthritis, unspecified site: Secondary | ICD-10-CM

## 2010-09-13 DIAGNOSIS — M2550 Pain in unspecified joint: Secondary | ICD-10-CM

## 2010-09-13 DIAGNOSIS — M25512 Pain in left shoulder: Secondary | ICD-10-CM

## 2010-09-13 DIAGNOSIS — M25519 Pain in unspecified shoulder: Secondary | ICD-10-CM

## 2010-09-13 MED ORDER — MELOXICAM 7.5 MG PO TABS: 7.5000 mg | ORAL_TABLET | Freq: Every day | ORAL | Status: DC | PRN

## 2010-09-13 NOTE — Assessment & Plan Note (Addendum)
Hand x ray showed: Chondrocalcinosis consistent with CPPD or osteoarthritis.  Degenerative changes are noted particularly involving the DIP  joints. Good response to meloxicam.  Use sparingly.  We discussed risk of NSAID gastritis and renal insuff.   Monitor Cr

## 2010-09-13 NOTE — Telephone Encounter (Signed)
Lab orders entered for November 2012

## 2010-09-13 NOTE — Assessment & Plan Note (Signed)
Uric acid level and sed rate was normal.

## 2010-09-13 NOTE — Assessment & Plan Note (Signed)
Hand x ray incidentally showed osteopenia.  Arrange DEXA scan

## 2010-09-13 NOTE — Telephone Encounter (Signed)
PLEASE FAX A LAB ORDER TO [facility] FOR THE WEEK OF 11 12 FOR BMET 401.9 FLP LFT 272.4

## 2010-09-13 NOTE — Patient Instructions (Signed)
Please complete the following lab tests before your next follow up appointment: BMET - 401.9 FLP, LFTs - 272.4 

## 2010-09-13 NOTE — Progress Notes (Signed)
  Subjective:    Patient ID: Katherine Rubio, female    DOB: 25-Dec-1922, 77 y.o.   MRN: [id number]  HPI  77 y/o female follow  Up.  Pt feeling much better since starting meloxicam.  She denies any GI side effects - no abd pain, no melena or hematochezia.  She is using meloxicam sparingly.    We reviewed hand and shoulder x rays.  Her uric acid level and sed rate were normal  Review of Systems     Past Medical History  Diagnosis Date  . Depression   . Hyperlipidemia   . Hypertension   . Osteoarthritis   . Chronic low back pain     History   Social History  . Marital Status: Widowed    Spouse Name: N/A    Number of Children: N/A  . Years of Education: N/A   Occupational History  . Not on file.   Social History Main Topics  . Smoking status: Never Smoker   . Smokeless tobacco: Not on file  . Alcohol Use: No  . Drug Use: Not on file  . Sexually Active: Not on file   Other Topics Concern  . Not on file   Social History Narrative   WidowedRetired as a Scientist, physiological.   Supportive daughterNever SmokedAlcohol use-no     Past Surgical History  Procedure Date  . Back surgery   . Knee surgery     left knee replacement    Family History  Problem Relation Age of Onset  . Lung cancer Brother     deceased  . Stroke Father     deceased age 75  . Pulmonary fibrosis Son     deceased  . Osteoarthritis Brother     No Known Allergies  Current Outpatient Prescriptions on File Prior to Visit  Medication Sig Dispense Refill  . ALPRAZolam (XANAX) 0.25 MG tablet Take 0.25 mg by mouth. 1/2 to 1 tablet by mouth two times a day as needed       . bisoprolol-hydrochlorothiazide (ZIAC) 2.5-6.25 MG per tablet Take 1 tablet by mouth daily.        . meloxicam (MOBIC) 7.5 MG tablet Take 1 tablet (7.5 mg total) by mouth daily as needed for pain.  30 tablet  0  . pravastatin (PRAVACHOL) 20 MG tablet Take 20 mg by mouth at bedtime.        . sertraline (ZOLOFT) 50 MG tablet Take 50  mg by mouth daily.          BP 124/74  Pulse 61  Temp(Src) 98 F (36.7 C) (Oral)  Resp 18  Wt 181 lb (82.101 kg)  SpO2 95%    Objective:   Physical Exam    Constitutional: Appears well-developed and well-nourished. No distress.  Cardiovascular: Normal rate, regular rhythm and normal heart sounds.  Exam reveals no gallop and no friction rub.   No murmur heard. Pulmonary/Chest: Effort normal and breath sounds normal.  No wheezes. No rales.  Neurological: Alert. No cranial nerve deficit.  Skin: Skin is warm and dry.  Psychiatric: Normal mood and affect. Behavior is normal.   Assessment & Plan:

## 2010-09-21 ENCOUNTER — Other Ambulatory Visit: Payer: Medicare Other

## 2010-09-25 ENCOUNTER — Ambulatory Visit (INDEPENDENT_AMBULATORY_CARE_PROVIDER_SITE_OTHER)
Admission: RE | Admit: 2010-09-25 | Discharge: 2010-09-25 | Disposition: A | Discharge status: Home / Self Care | Payer: Medicare Other | Source: Ambulatory Visit

## 2010-09-25 DIAGNOSIS — M8580 Other specified disorders of bone density and structure, unspecified site: Secondary | ICD-10-CM

## 2010-09-25 DIAGNOSIS — M899 Disorder of bone, unspecified: Secondary | ICD-10-CM

## 2010-09-25 LAB — HM DEXA SCAN

## 2010-10-04 ENCOUNTER — Encounter: Payer: Self-pay | Admitting: Internal Medicine

## 2010-11-08 ENCOUNTER — Telehealth: Payer: Self-pay | Admitting: Internal Medicine

## 2010-11-08 NOTE — Telephone Encounter (Signed)
Refill- sertraline hcl 50mg  tablet. Take one tablet by mouth every day. Qty 90. Last fill 4.7.12

## 2010-11-09 MED ORDER — SERTRALINE HCL 50 MG PO TABS: 50.0000 mg | ORAL_TABLET | Freq: Every day | ORAL | Status: DC

## 2010-11-09 NOTE — Telephone Encounter (Signed)
Last seen 09/13/10, follow up on 11/19.  RX sent.

## 2010-12-12 ENCOUNTER — Telehealth: Payer: Self-pay | Admitting: Internal Medicine

## 2010-12-12 MED ORDER — PRAVASTATIN SODIUM 20 MG PO TABS: 20.0000 mg | ORAL_TABLET | Freq: Every day | ORAL | Status: DC

## 2010-12-12 NOTE — Telephone Encounter (Signed)
Rx refill sent to pharmacy. 

## 2010-12-12 NOTE — Telephone Encounter (Signed)
Refill- pravastatin sodium 20mg  tab. Take one tablet by mouth at bedtime. Qty 90. Last fill 5.21.12

## 2010-12-18 ENCOUNTER — Encounter: Payer: Self-pay | Admitting: Internal Medicine

## 2010-12-18 ENCOUNTER — Ambulatory Visit (INDEPENDENT_AMBULATORY_CARE_PROVIDER_SITE_OTHER): Payer: Medicare Other | Admitting: Internal Medicine

## 2010-12-18 ENCOUNTER — Ambulatory Visit: Payer: Medicare Other | Admitting: Internal Medicine

## 2010-12-18 VITALS — BP 135/75 | HR 98 | Temp 97.8°F | Resp 16 | Wt 182.0 lb

## 2010-12-18 DIAGNOSIS — Z01818 Encounter for other preprocedural examination: Secondary | ICD-10-CM

## 2010-12-18 DIAGNOSIS — M25569 Pain in unspecified knee: Secondary | ICD-10-CM

## 2010-12-18 LAB — CBC
Hemoglobin: 13.5 g/dL (ref 12.0–15.0)
MCH: 30.6 pg (ref 26.0–34.0)
RBC: 4.41 MIL/uL (ref 3.87–5.11)

## 2010-12-18 LAB — BASIC METABOLIC PANEL
BUN: 25 mg/dL — ABNORMAL HIGH (ref 6–23)
CO2: 24 mEq/L (ref 19–32)
Chloride: 105 mEq/L (ref 96–112)
Creat: 1 mg/dL (ref 0.50–1.10)
Potassium: 4.4 mEq/L (ref 3.5–5.3)

## 2010-12-21 ENCOUNTER — Ambulatory Visit: Payer: Medicare Other | Admitting: Internal Medicine

## 2010-12-24 ENCOUNTER — Encounter: Payer: Self-pay | Admitting: Internal Medicine

## 2010-12-24 DIAGNOSIS — M25569 Pain in unspecified knee: Secondary | ICD-10-CM | POA: Insufficient documentation

## 2010-12-24 NOTE — Progress Notes (Signed)
  Subjective:    Patient ID: Katherine Rubio, female    DOB: Jul 21, 1922, 77 y.o.   MRN: [id number]  HPI Pt presents to clinic for evaluation of knee pain and preoperative evaluation.  H/o chronic knee pain that impedes daily activities due to pain. No exacerbating or alleviating factors. Seeing orthopedics who recommend surgical tx. No known h/o CAD, arrythmia or chronic lung disease. Denies chest pain, palpitations, shortness of breath. No known difficulty with anesthesia or coagulopathy. No other complaints.  Past Medical History  Diagnosis Date  . Depression   . Hyperlipidemia   . Hypertension   . Osteoarthritis   . Chronic low back pain    Past Surgical History  Procedure Date  . Back surgery   . Knee surgery     left knee replacement    reports that she has never smoked. She has never used smokeless tobacco. She reports that she does not drink alcohol. Her drug history not on file. family history includes Lung cancer in her brother; Osteoarthritis in her brother; Pulmonary fibrosis in her son; and Stroke in her father. No Known Allergies   Review of Systems see hpi    Objective:   Physical Exam  Physical Exam  Nursing note and vitals reviewed. Constitutional: Appears well-developed and well-nourished. No distress.  HENT:  Head: Normocephalic and atraumatic.  Right Ear: External ear normal.  Left Ear: External ear normal.  Eyes: Conjunctivae are normal. No scleral icterus.  Neck: Neck supple. Carotid bruit is not present.  Cardiovascular: Normal rate, regular rhythm and normal heart sounds.  Exam reveals no gallop and no friction rub.   No murmur heard. Pulmonary/Chest: Effort normal and breath sounds normal. No respiratory distress. He has no wheezes. no rales.  Lymphadenopathy:    He has no cervical adenopathy.  Neurological:Alert.  Skin: Skin is warm and dry. Not diaphoretic.  Psychiatric: Has a normal mood and affect.        Assessment & Plan:

## 2010-12-24 NOTE — Assessment & Plan Note (Signed)
EKG obtained demonstrates nsr 66 with nl intervals and axis. Obtain CBC, chem7. Pending lab results there is no obvious contraindication for proceeding with surgery.

## 2010-12-26 ENCOUNTER — Telehealth: Payer: Self-pay | Admitting: Internal Medicine

## 2010-12-26 MED ORDER — BISOPROLOL-HYDROCHLOROTHIAZIDE 2.5-6.25 MG PO TABS: 1.0000 | ORAL_TABLET | Freq: Every day | ORAL | Status: DC

## 2010-12-26 NOTE — Telephone Encounter (Signed)
Rx refill sent to pharmacy. 

## 2010-12-26 NOTE — Telephone Encounter (Signed)
Refill- bisoprolol-hctz 2.5-6.25mg  tab. Take one tablet by mouth every day. Qty 90. Last fill 5.26.12

## 2011-01-02 ENCOUNTER — Other Ambulatory Visit (HOSPITAL_COMMUNITY): Payer: Self-pay | Admitting: Orthopedic Surgery

## 2011-01-02 ENCOUNTER — Ambulatory Visit (HOSPITAL_COMMUNITY)
Admission: RE | Admit: 2011-01-02 | Discharge: 2011-01-02 | Disposition: A | Discharge status: Home / Self Care | Payer: Medicare Other | Source: Ambulatory Visit | Attending: Orthopedic Surgery | Admitting: Orthopedic Surgery

## 2011-01-02 ENCOUNTER — Other Ambulatory Visit (HOSPITAL_COMMUNITY): Payer: Medicare Other

## 2011-01-02 ENCOUNTER — Encounter (HOSPITAL_COMMUNITY)
Admission: RE | Admit: 2011-01-02 | Discharge: 2011-01-02 | Disposition: A | Discharge status: Home / Self Care | Payer: Medicare Other | Source: Ambulatory Visit | Attending: Orthopedic Surgery | Admitting: Orthopedic Surgery

## 2011-01-02 DIAGNOSIS — Z01818 Encounter for other preprocedural examination: Secondary | ICD-10-CM | POA: Insufficient documentation

## 2011-01-02 DIAGNOSIS — Z01811 Encounter for preprocedural respiratory examination: Secondary | ICD-10-CM

## 2011-01-02 DIAGNOSIS — M169 Osteoarthritis of hip, unspecified: Secondary | ICD-10-CM | POA: Insufficient documentation

## 2011-01-02 DIAGNOSIS — Z01812 Encounter for preprocedural laboratory examination: Secondary | ICD-10-CM | POA: Insufficient documentation

## 2011-01-02 DIAGNOSIS — M1990 Unspecified osteoarthritis, unspecified site: Secondary | ICD-10-CM

## 2011-01-02 DIAGNOSIS — M1610 Unilateral primary osteoarthritis, unspecified hip: Secondary | ICD-10-CM | POA: Insufficient documentation

## 2011-01-02 DIAGNOSIS — Z79899 Other long term (current) drug therapy: Secondary | ICD-10-CM | POA: Insufficient documentation

## 2011-01-02 LAB — DIFFERENTIAL
Basophils Absolute: 0 10*3/uL (ref 0.0–0.1)
Eosinophils Relative: 3 % (ref 0–5)
Lymphocytes Relative: 33 % (ref 12–46)
Monocytes Absolute: 0.9 10*3/uL (ref 0.1–1.0)
Monocytes Relative: 11 % (ref 3–12)
Neutro Abs: 4.2 10*3/uL (ref 1.7–7.7)

## 2011-01-02 LAB — APTT: aPTT: 26 seconds (ref 24–37)

## 2011-01-02 LAB — SURGICAL PCR SCREEN: Staphylococcus aureus: POSITIVE — AB

## 2011-01-02 LAB — COMPREHENSIVE METABOLIC PANEL
ALT: 18 U/L (ref 0–35)
AST: 25 U/L (ref 0–37)
Albumin: 3.7 g/dL (ref 3.5–5.2)
Alkaline Phosphatase: 75 U/L (ref 39–117)
Chloride: 107 mEq/L (ref 96–112)
Potassium: 4 mEq/L (ref 3.5–5.1)
Sodium: 144 mEq/L (ref 135–145)
Total Bilirubin: 0.3 mg/dL (ref 0.3–1.2)

## 2011-01-02 LAB — CBC
HCT: 41.2 % (ref 36.0–46.0)
Hemoglobin: 13.9 g/dL (ref 12.0–15.0)
MCH: 31.3 pg (ref 26.0–34.0)
MCHC: 33.7 g/dL (ref 30.0–36.0)
RDW: 13.5 % (ref 11.5–15.5)

## 2011-01-02 LAB — URINALYSIS, ROUTINE W REFLEX MICROSCOPIC
Glucose, UA: NEGATIVE mg/dL
Leukocytes, UA: NEGATIVE
Nitrite: NEGATIVE
Specific Gravity, Urine: 1.021 (ref 1.005–1.030)
pH: 6 (ref 5.0–8.0)

## 2011-01-03 LAB — URINE CULTURE
Colony Count: 30000
Culture  Setup Time: 201209112312

## 2011-01-08 ENCOUNTER — Inpatient Hospital Stay (HOSPITAL_COMMUNITY)
Admission: RE | Admit: 2011-01-08 | Discharge: 2011-01-11 | DRG: 470 | Disposition: A | Discharge status: Home Health | Payer: Medicare Other | Source: Ambulatory Visit | Attending: Orthopedic Surgery | Admitting: Orthopedic Surgery

## 2011-01-08 ENCOUNTER — Inpatient Hospital Stay (HOSPITAL_COMMUNITY): Payer: Medicare Other

## 2011-01-08 DIAGNOSIS — D62 Acute posthemorrhagic anemia: Secondary | ICD-10-CM | POA: Diagnosis not present

## 2011-01-08 DIAGNOSIS — M1710 Unilateral primary osteoarthritis, unspecified knee: Principal | ICD-10-CM | POA: Diagnosis present

## 2011-01-08 DIAGNOSIS — Z96659 Presence of unspecified artificial knee joint: Secondary | ICD-10-CM

## 2011-01-08 DIAGNOSIS — Z01812 Encounter for preprocedural laboratory examination: Secondary | ICD-10-CM

## 2011-01-08 DIAGNOSIS — K219 Gastro-esophageal reflux disease without esophagitis: Secondary | ICD-10-CM | POA: Diagnosis present

## 2011-01-08 DIAGNOSIS — Z79899 Other long term (current) drug therapy: Secondary | ICD-10-CM

## 2011-01-08 DIAGNOSIS — I10 Essential (primary) hypertension: Secondary | ICD-10-CM | POA: Diagnosis present

## 2011-01-08 DIAGNOSIS — E669 Obesity, unspecified: Secondary | ICD-10-CM | POA: Diagnosis present

## 2011-01-08 DIAGNOSIS — Z7982 Long term (current) use of aspirin: Secondary | ICD-10-CM

## 2011-01-09 LAB — CBC
MCH: 31.3 pg (ref 26.0–34.0)
MCHC: 33.7 g/dL (ref 30.0–36.0)
Platelets: 163 10*3/uL (ref 150–400)
RBC: 3.58 MIL/uL — ABNORMAL LOW (ref 3.87–5.11)

## 2011-01-09 LAB — BASIC METABOLIC PANEL
Calcium: 8.4 mg/dL (ref 8.4–10.5)
GFR calc Af Amer: >60 mL/min (ref >60)
GFR calc non Af Amer: >60 mL/min (ref >60)
Sodium: 135 mEq/L (ref 135–145)

## 2011-01-10 LAB — BASIC METABOLIC PANEL
CO2: 27 mEq/L (ref 19–32)
Chloride: 103 mEq/L (ref 96–112)
Sodium: 136 mEq/L (ref 135–145)

## 2011-01-10 LAB — URINALYSIS, ROUTINE W REFLEX MICROSCOPIC
Glucose, UA: NEGATIVE mg/dL
Leukocytes, UA: NEGATIVE
Nitrite: NEGATIVE
Protein, ur: NEGATIVE mg/dL
pH: 5.5 (ref 5.0–8.0)

## 2011-01-10 LAB — CBC
Platelets: 152 10*3/uL (ref 150–400)
RBC: 3.31 MIL/uL — ABNORMAL LOW (ref 3.87–5.11)
WBC: 8 10*3/uL (ref 4.0–10.5)

## 2011-01-11 LAB — CBC
HCT: 33.9 % — ABNORMAL LOW (ref 36.0–46.0)
Hemoglobin: 11.5 g/dL — ABNORMAL LOW (ref 12.0–15.0)
RDW: 13.2 % (ref 11.5–15.5)
WBC: 7 10*3/uL (ref 4.0–10.5)

## 2011-01-11 LAB — BASIC METABOLIC PANEL
BUN: 20 mg/dL (ref 6–23)
Chloride: 107 mEq/L (ref 96–112)
GFR calc Af Amer: >60 mL/min (ref >60)
Potassium: 3.7 mEq/L (ref 3.5–5.1)
Sodium: 139 mEq/L (ref 135–145)

## 2011-01-12 LAB — CROSSMATCH
ABO/RH(D): A POS
Unit division: 0

## 2011-01-26 ENCOUNTER — Ambulatory Visit (INDEPENDENT_AMBULATORY_CARE_PROVIDER_SITE_OTHER): Payer: Medicare Other | Admitting: Internal Medicine

## 2011-01-26 ENCOUNTER — Encounter: Payer: Self-pay | Admitting: Internal Medicine

## 2011-01-26 DIAGNOSIS — D539 Nutritional anemia, unspecified: Secondary | ICD-10-CM

## 2011-01-26 DIAGNOSIS — M8580 Other specified disorders of bone density and structure, unspecified site: Secondary | ICD-10-CM

## 2011-01-26 DIAGNOSIS — R5383 Other fatigue: Secondary | ICD-10-CM | POA: Insufficient documentation

## 2011-01-26 DIAGNOSIS — M899 Disorder of bone, unspecified: Secondary | ICD-10-CM

## 2011-01-26 DIAGNOSIS — R5381 Other malaise: Secondary | ICD-10-CM

## 2011-01-26 LAB — BASIC METABOLIC PANEL
BUN: 24 mg/dL — ABNORMAL HIGH (ref 6–23)
CO2: 22 mEq/L (ref 19–32)
Chloride: 102 mEq/L (ref 96–112)
Glucose, Bld: 111 mg/dL — ABNORMAL HIGH (ref 70–99)
Potassium: 4.1 mEq/L (ref 3.5–5.3)
Sodium: 139 mEq/L (ref 135–145)

## 2011-01-26 LAB — HEPATIC FUNCTION PANEL
ALT: 19 U/L (ref 0–35)
AST: 35 U/L (ref 0–37)
Albumin: 4.4 g/dL (ref 3.5–5.2)
Alkaline Phosphatase: 71 U/L (ref 39–117)

## 2011-01-26 LAB — CBC WITH DIFFERENTIAL/PLATELET
HCT: 42.1 % (ref 36.0–46.0)
Hemoglobin: 14.1 g/dL (ref 12.0–15.0)
Lymphocytes Relative: 26 % (ref 12–46)
Lymphs Abs: 1.9 10*3/uL (ref 0.7–4.0)
MCHC: 33.5 g/dL (ref 30.0–36.0)
Monocytes Absolute: 0.6 10*3/uL (ref 0.1–1.0)
Monocytes Relative: 9 % (ref 3–12)
Neutro Abs: 4.7 10*3/uL (ref 1.7–7.7)
RBC: 4.47 MIL/uL (ref 3.87–5.11)

## 2011-01-26 LAB — TSH: TSH: 2.486 u[IU]/mL (ref 0.350–4.500)

## 2011-01-26 NOTE — Assessment & Plan Note (Signed)
Obtain vit d level 

## 2011-01-26 NOTE — Assessment & Plan Note (Signed)
No associated sx's. Begin laboratory evaluation. If evaluation unrevealing also consider possible contribution from mild depression.

## 2011-01-26 NOTE — Assessment & Plan Note (Signed)
Obtain cbc, ferritin and b12. No gross active bleeding

## 2011-01-26 NOTE — Progress Notes (Signed)
  Subjective:    Patient ID: Katherine Rubio, female    DOB: [birth year], 77 y.o.   MRN: [id number]  HPI Pt presents to clinic for evaluation of fatigue. Notes ongoing chronic fatigue that predates her recent right TKR. Is recovering well and ambulates with a walker. During hospitalization was noted to be anemic and has no gross active bleeding. No more specific sx's such as cp, dyspnea, or abdominal pain. Does have intermittent mild reduction of mood. Denies anhedonia. No other alleviating or exacerbating factors. No other complaints.  Past Medical History  Diagnosis Date  . Depression   . Hyperlipidemia   . Hypertension   . Osteoarthritis   . Chronic low back pain    Past Surgical History  Procedure Date  . Back surgery   . Knee surgery     left knee replacement    reports that she has never smoked. She has never used smokeless tobacco. She reports that she does not drink alcohol. Her drug history not on file. family history includes Lung cancer in her brother; Osteoarthritis in her brother; Pulmonary fibrosis in her son; and Stroke in her father. No Known Allergies    Review of Systems see hpi     Objective:   Physical Exam  Nursing note and vitals reviewed. Constitutional: She appears well-developed and well-nourished. No distress.  HENT:  Head: Normocephalic and atraumatic.  Right Ear: Tympanic membrane, external ear and ear canal normal.  Left Ear: Tympanic membrane, external ear and ear canal normal.  Mouth/Throat: No oropharyngeal exudate.  Eyes: Conjunctivae are normal. Pupils are equal, round, and reactive to light. Right eye exhibits no discharge. Left eye exhibits no discharge. No scleral icterus.  Neck: Neck supple. No JVD present. Carotid bruit is not present.  Cardiovascular: Normal rate, regular rhythm and normal heart sounds.   Pulmonary/Chest: Effort normal and breath sounds normal. No respiratory distress. She has no wheezes. She has no rales.    Lymphadenopathy:    She has no cervical adenopathy.  Neurological: She is alert.  Skin: Skin is warm. She is not diaphoretic.  Psychiatric: She has a normal mood and affect. Her speech is normal and behavior is normal.          Assessment & Plan:

## 2011-01-28 NOTE — Op Note (Signed)
  Katherine Rubio, Katherine Rubio           ACCOUNT NO.:  [id number]  MEDICAL RECORD NO.:  [id number]  LOCATION:  2550                         FACILITY:  [facility]  PHYSICIAN:  [name]. Sherlean Foot, M.D. DATE OF BIRTH:  [birth year]  DATE OF PROCEDURE:  01/08/2011 DATE OF DISCHARGE:                              OPERATIVE REPORT   SURGEON:  [name]. Sherlean Foot, MD  ASSISTANT:  Altamese Cabal, PA-C  ANESTHESIA:  General.  PREOPERATIVE DIAGNOSIS:  Right knee osteoarthritis, medial compartment.  POSTOPERATIVE DIAGNOSIS:  Right knee osteoarthritis, medial compartment.  PROCEDURE:  Right unicompartmental arthroplasty.  INDICATIONS FOR PROCEDURE:  The patient is an 77 year old white female with failure of conservative measures for medial compartment osteoarthritis.  Informed consent was obtained.  DESCRIPTION OF PROCEDURE:  The patient was laid supine and administered general anesthesia.  The right leg was prepped and draped in usual sterile fashion.  The extremity was exsanguinated with Esmarch. Tourniquet was inflated to 350 mmHg.  I then made a midline incision from the inferior pole of the patella to the tibial keel.  I then used fresh blade to incise the medial to the patellar tendon, held to the mid of patella.  I then went into flexion, removed the anterior fat pad in medial meniscus.  I used the extramedullary system with the C-arm device to ensure I pinned the tibial guide through the mechanical axis from the hip to the ankle.  I then tensioned the distal intra-articular arm on the femur, put the MCL under tension, pinned the distal cutting block and placement of the distal femoral cut and proximal tibial cut.  I then removed the cutting block, placed the lollipop in place and checked under AP imaging to ensure I had cut in extension gap in mechanical axis alignment.  After that I went to flexion to size a size D, pinned it into place, cut the chamfer and posterior condylar cut in the 2  lug holes.  I then placed a D in place, it fitted very well.  I then sized the tibia to a 3, drilled through lug holes and then trialed for a 11 tibial poly.  I had good fit and good balance.  At this point, I then removed trial components, copiously irrigated and cemented the femur 3 tibia, snapped in the 11 polyethylene, allowed the cement to harden in extension.  I removed all excess cement, then let tourniquet down for 51 minutes, obtained hemostasis, copiously irrigating and closed the arthrotomy with figure-of-eight #1 Vicryl sutures, deep soft tissue, buried 0 Vicryl sutures, subcuticular stitches, and skin staples. Dressed with Xeroform, dressing sponges, sterile Webril, TED stocking.  COMPLICATIONS:  None.  DRAINS:  None.  ESTIMATED BLOOD LOSS:  Minimal.  TOURNIQUET TIME:  51 minutes.          ______________________________ [name] Sherlean Foot, M.D.     SDL/MEDQ  D:  01/08/2011  T:  01/08/2011  Job:  [id number]  Electronically Signed by Georgena Spurling M.D. on 01/28/2011 11:21:49 AM

## 2011-01-29 ENCOUNTER — Telehealth: Payer: Self-pay | Admitting: Internal Medicine

## 2011-01-29 MED ORDER — VITAMIN D 50 MCG (2000 UT) PO TABS: 2000.0000 [IU] | ORAL_TABLET | Freq: Every day | ORAL | Status: AC

## 2011-01-29 NOTE — Telephone Encounter (Signed)
Call returned to Alexis Frock at  [phone], he was advised of patients lab results per Dr Rodena Medin instructions, and stated that he will advise patient of test results.

## 2011-01-29 NOTE — Telephone Encounter (Signed)
Patient is calling agin to get lab results please call [phone]

## 2011-01-29 NOTE — Telephone Encounter (Signed)
Patient returned your call she is available all morning

## 2011-01-29 NOTE — Telephone Encounter (Signed)
Alexis Frock called and left a voice message stating he is healthcare power of attorney for Katherine Rubio, and he is requesting a return call at [phone] regarding patient lab results.

## 2011-01-31 ENCOUNTER — Ambulatory Visit: Payer: Medicare Other | Admitting: Physical Therapy

## 2011-01-31 ENCOUNTER — Ambulatory Visit: Payer: Medicare Other | Attending: Orthopedic Surgery | Admitting: Physical Therapy

## 2011-01-31 DIAGNOSIS — R262 Difficulty in walking, not elsewhere classified: Secondary | ICD-10-CM | POA: Insufficient documentation

## 2011-01-31 DIAGNOSIS — IMO0001 Reserved for inherently not codable concepts without codable children: Secondary | ICD-10-CM | POA: Insufficient documentation

## 2011-01-31 DIAGNOSIS — M25569 Pain in unspecified knee: Secondary | ICD-10-CM | POA: Insufficient documentation

## 2011-01-31 DIAGNOSIS — M25669 Stiffness of unspecified knee, not elsewhere classified: Secondary | ICD-10-CM | POA: Insufficient documentation

## 2011-02-02 ENCOUNTER — Ambulatory Visit: Payer: Medicare Other | Admitting: Physical Therapy

## 2011-02-06 ENCOUNTER — Ambulatory Visit: Payer: Medicare Other | Admitting: Physical Therapy

## 2011-02-08 ENCOUNTER — Ambulatory Visit: Payer: Medicare Other | Admitting: Physical Therapy

## 2011-02-13 ENCOUNTER — Ambulatory Visit: Payer: Medicare Other | Admitting: Physical Therapy

## 2011-02-16 ENCOUNTER — Ambulatory Visit: Payer: Medicare Other | Admitting: Physical Therapy

## 2011-02-19 ENCOUNTER — Encounter: Payer: Self-pay | Admitting: Internal Medicine

## 2011-02-19 ENCOUNTER — Ambulatory Visit (INDEPENDENT_AMBULATORY_CARE_PROVIDER_SITE_OTHER): Payer: Medicare Other | Admitting: Internal Medicine

## 2011-02-19 VITALS — BP 110/70 | HR 72 | Temp 97.7°F | Resp 18 | Wt 175.0 lb

## 2011-02-19 DIAGNOSIS — J40 Bronchitis, not specified as acute or chronic: Secondary | ICD-10-CM

## 2011-02-19 MED ORDER — DOXYCYCLINE HYCLATE 100 MG PO TABS: 100.0000 mg | ORAL_TABLET | Freq: Two times a day (BID) | ORAL | Status: AC

## 2011-02-25 DIAGNOSIS — J40 Bronchitis, not specified as acute or chronic: Secondary | ICD-10-CM | POA: Insufficient documentation

## 2011-02-25 NOTE — Progress Notes (Signed)
  Subjective:    Patient ID: Katherine Rubio, female    DOB: Dec 25, 1922, 77 y.o.   MRN: [id number]  HPI Pt presents to clinic for evaluation of cough. Notes one week h/o head congestion, green nasal drainage and cough productive for yellow sputum. No hemoptysis, f/c, wheezing or dyspnea. No alleviating or exacerbating factors. Taking no medication for the problem. No other complaints.  Past Medical History  Diagnosis Date  . Depression   . Hyperlipidemia   . Hypertension   . Osteoarthritis   . Chronic low back pain    Past Surgical History  Procedure Date  . Back surgery   . Knee surgery     left knee replacement    reports that she has never smoked. She has never used smokeless tobacco. She reports that she does not drink alcohol. Her drug history not on file. family history includes Lung cancer in her brother; Osteoarthritis in her brother; Pulmonary fibrosis in her son; and Stroke in her father. No Known Allergies     Review of Systems see hpi     Objective:   Physical Exam  Nursing note and vitals reviewed. Constitutional: She appears well-developed and well-nourished. No distress.  HENT:  Head: Normocephalic and atraumatic.  Right Ear: Tympanic membrane, external ear and ear canal normal.  Left Ear: Tympanic membrane, external ear and ear canal normal.  Nose: Nose normal.  Mouth/Throat: Oropharynx is clear and moist. No oropharyngeal exudate.  Eyes: Conjunctivae are normal. Right eye exhibits no discharge. Left eye exhibits no discharge.  Neck: Neck supple.  Cardiovascular: Normal rate, regular rhythm and normal heart sounds.  Exam reveals no gallop and no friction rub.   No murmur heard. Pulmonary/Chest: Effort normal and breath sounds normal. No respiratory distress. She has no wheezes. She has no rales.  Lymphadenopathy:    She has no cervical adenopathy.  Neurological: She is alert.  Skin: Skin is warm and dry. She is not diaphoretic.  Psychiatric: She has a  normal mood and affect.          Assessment & Plan:

## 2011-02-25 NOTE — Assessment & Plan Note (Signed)
Begin po abx course. Followup if no improvement or worsening.

## 2011-02-27 ENCOUNTER — Ambulatory Visit: Payer: Medicare Other | Attending: Orthopedic Surgery | Admitting: Physical Therapy

## 2011-02-27 DIAGNOSIS — M25569 Pain in unspecified knee: Secondary | ICD-10-CM | POA: Insufficient documentation

## 2011-02-27 DIAGNOSIS — IMO0001 Reserved for inherently not codable concepts without codable children: Secondary | ICD-10-CM | POA: Insufficient documentation

## 2011-02-27 DIAGNOSIS — R262 Difficulty in walking, not elsewhere classified: Secondary | ICD-10-CM | POA: Insufficient documentation

## 2011-02-27 DIAGNOSIS — M25669 Stiffness of unspecified knee, not elsewhere classified: Secondary | ICD-10-CM | POA: Insufficient documentation

## 2011-03-12 ENCOUNTER — Ambulatory Visit: Payer: Medicare Other | Admitting: Internal Medicine

## 2011-05-28 ENCOUNTER — Telehealth: Payer: Self-pay | Admitting: Internal Medicine

## 2011-05-28 NOTE — Telephone Encounter (Signed)
Ok [id number] 

## 2011-05-28 NOTE — Telephone Encounter (Signed)
Sertraline is not on pt's current medication list. Last refill on historical entry was in July 2012. Please advise re: refills.

## 2011-05-29 MED ORDER — SERTRALINE HCL 50 MG PO TABS: 50.0000 mg | ORAL_TABLET | Freq: Every day | ORAL | Status: DC

## 2011-05-29 NOTE — Telephone Encounter (Signed)
Refill sent to pharmacy.   

## 2011-06-26 ENCOUNTER — Encounter: Payer: Self-pay | Admitting: Internal Medicine

## 2011-06-26 ENCOUNTER — Ambulatory Visit (INDEPENDENT_AMBULATORY_CARE_PROVIDER_SITE_OTHER): Payer: Medicare Other | Admitting: Internal Medicine

## 2011-06-26 VITALS — BP 112/70 | HR 70 | Temp 97.7°F | Resp 16 | Wt 182.0 lb

## 2011-06-26 DIAGNOSIS — G5600 Carpal tunnel syndrome, unspecified upper limb: Secondary | ICD-10-CM

## 2011-06-26 DIAGNOSIS — M1990 Unspecified osteoarthritis, unspecified site: Secondary | ICD-10-CM

## 2011-06-26 MED ORDER — DICLOFENAC SODIUM 1 % TD GEL: TRANSDERMAL | Status: AC

## 2011-06-26 NOTE — Assessment & Plan Note (Signed)
Recommend begin bilateral wrist splints at night. Prescription provided. Followup if no improvement or worsening.

## 2011-06-26 NOTE — Progress Notes (Signed)
  Subjective:    Patient ID: Katherine Rubio, female    DOB: 04-Nov-1922, 78 y.o.   MRN: [id number]  HPI Pt presents to clinic for evaluation of hand pain. Notes chronic intermittent finger pain believed to be oa related. No associated inflammatory changes. Does however note pain and numbness of hands/finger at night. Right hand worse than left. No injury or trauma. Taking no medication for the problem. No other alleviating or exacerbating factors. No other complaints.  Past Medical History  Diagnosis Date  . Depression   . Hyperlipidemia   . Hypertension   . Osteoarthritis   . Chronic low back pain    Past Surgical History  Procedure Date  . Back surgery   . Knee surgery     left knee replacement    reports that she has never smoked. She has never used smokeless tobacco. She reports that she does not drink alcohol. Her drug history not on file. family history includes Lung cancer in her brother; Osteoarthritis in her brother; Pulmonary fibrosis in her son; and Stroke in her father. No Known Allergies   Review of Systems see hpi     Objective:   Physical Exam  Nursing note and vitals reviewed. Constitutional: She appears well-developed and well-nourished. No distress.  HENT:  Head: Normocephalic and atraumatic.  Musculoskeletal:       FROM bilateral hands/fingers. No inflammatory changes. +phalen's on the right. No thenar muscle wasting.  Neurological: She is alert.  Skin: Skin is warm and dry. She is not diaphoretic.  Psychiatric: She has a normal mood and affect.          Assessment & Plan:

## 2011-06-26 NOTE — Assessment & Plan Note (Signed)
Attempt voltaren gel prn for hands/fingers.

## 2011-08-10 ENCOUNTER — Telehealth: Payer: Self-pay | Admitting: Internal Medicine

## 2011-08-10 MED ORDER — BISOPROLOL-HYDROCHLOROTHIAZIDE 2.5-6.25 MG PO TABS: 1.0000 | ORAL_TABLET | Freq: Every day | ORAL | Status: DC

## 2011-08-10 NOTE — Telephone Encounter (Signed)
Patient states that she called pharmacy on Wednesday to get a refill on her bp med, bisoprolol. Patient states that she is out of her med. [facility] on [address].

## 2011-08-10 NOTE — Telephone Encounter (Signed)
Rx refill sent to pharmacy. 

## 2011-09-03 ENCOUNTER — Ambulatory Visit (INDEPENDENT_AMBULATORY_CARE_PROVIDER_SITE_OTHER): Payer: Medicare Other | Admitting: Internal Medicine

## 2011-09-03 ENCOUNTER — Telehealth: Payer: Self-pay | Admitting: Internal Medicine

## 2011-09-03 ENCOUNTER — Encounter: Payer: Self-pay | Admitting: Internal Medicine

## 2011-09-03 VITALS — BP 150/72 | HR 69 | Temp 97.9°F | Resp 20 | Ht 65.5 in | Wt 188.0 lb

## 2011-09-03 DIAGNOSIS — E785 Hyperlipidemia, unspecified: Secondary | ICD-10-CM

## 2011-09-03 DIAGNOSIS — R635 Abnormal weight gain: Secondary | ICD-10-CM

## 2011-09-03 DIAGNOSIS — Z79899 Other long term (current) drug therapy: Secondary | ICD-10-CM

## 2011-09-03 DIAGNOSIS — I10 Essential (primary) hypertension: Secondary | ICD-10-CM

## 2011-09-03 MED ORDER — LOSARTAN POTASSIUM 25 MG PO TABS: 25.0000 mg | ORAL_TABLET | Freq: Every day | ORAL | Status: DC

## 2011-09-03 MED ORDER — PRAVASTATIN SODIUM 20 MG PO TABS: 20.0000 mg | ORAL_TABLET | Freq: Every day | ORAL | Status: DC

## 2011-09-03 NOTE — Patient Instructions (Signed)
If you are looking for a weight medication that doesn't affect blood pressure there is Alli available without a prescription Please schedule fasting labs prior to your next appointment Chem7-v58.69, lipid/lft-272.4, vitamin d-vitamin d deficiency

## 2011-09-03 NOTE — Telephone Encounter (Signed)
90 day prescription request  Pravastatin sodium 20mg  tab.

## 2011-09-03 NOTE — Assessment & Plan Note (Signed)
Pt very concerned about wt gain. Do not advise prescription phentermine with elevated bp. States she will buy all the otc medications. Advised otc alli will not affect bp. Recommend exercise.

## 2011-09-03 NOTE — Telephone Encounter (Signed)
Rx refill sent to pharmacy. 

## 2011-09-03 NOTE — Assessment & Plan Note (Signed)
suboptimal control. Add losartan 25mg  po qd. Monitor bp and f/u 3wks or sooner if needed. Obtain cbc and chem7 prior to next visit

## 2011-09-03 NOTE — Telephone Encounter (Signed)
Lab orders entered for June 2013. 

## 2011-09-03 NOTE — Progress Notes (Signed)
  Subjective:    Patient ID: Katherine Rubio, female    DOB: [birth year], 78 y.o.   MRN: [id number]  HPI Pt presents to clinic for evaluation of elevated blood pressure. Notes two week h/o elevated bp without trigger. Had posterior ha's last week now resolved. Compliant with bp medication. Very worried about weight gain and requests prescription weight loss medication.   Past Medical History  Diagnosis Date  . Depression   . Hyperlipidemia   . Hypertension   . Osteoarthritis   . Chronic low back pain    Past Surgical History  Procedure Date  . Back surgery   . Knee surgery     left knee replacement    reports that she has never smoked. She has never used smokeless tobacco. She reports that she does not drink alcohol. Her drug history not on file. family history includes Lung cancer in her brother; Osteoarthritis in her brother; Pulmonary fibrosis in her son; and Stroke in her father. No Known Allergies   Review of Systems see hpi     Objective:   Physical Exam  Physical Exam  Nursing note and vitals reviewed. Constitutional: Appears well-developed and well-nourished. No distress.  HENT:  Head: Normocephalic and atraumatic.  Right Ear: External ear normal.  Left Ear: External ear normal.  Eyes: Conjunctivae are normal. No scleral icterus.  Neck: Neck supple. Carotid bruit is not present.  Cardiovascular: Normal rate, regular rhythm and normal heart sounds.  Exam reveals no gallop and no friction rub.   No murmur heard. Pulmonary/Chest: Effort normal and breath sounds normal. No respiratory distress. He has no wheezes. no rales.  Lymphadenopathy:    He has no cervical adenopathy.  Neurological:Alert.  Skin: Skin is warm and dry. Not diaphoretic.  Psychiatric: Has a normal mood and affect.        Assessment & Plan:

## 2011-09-19 LAB — LIPID PANEL
LDL Cholesterol: 84 mg/dL (ref 0–99)
VLDL: 28 mg/dL (ref 0–40)

## 2011-09-19 NOTE — Telephone Encounter (Signed)
Pt presented to the lab, future order released. 

## 2011-09-19 NOTE — Telephone Encounter (Signed)
Addended by: Mervin Kung A on: 09/19/2011 08:59 AM   Modules accepted: Orders

## 2011-09-20 LAB — HEPATIC FUNCTION PANEL
Alkaline Phosphatase: 70 U/L (ref 39–117)
Bilirubin, Direct: 0.2 mg/dL (ref 0.0–0.3)
Indirect Bilirubin: 0.5 mg/dL (ref 0.0–0.9)

## 2011-09-20 LAB — BASIC METABOLIC PANEL
BUN: 28 mg/dL — ABNORMAL HIGH (ref 6–23)
Chloride: 108 mEq/L (ref 96–112)
Creat: 0.72 mg/dL (ref 0.50–1.10)
Glucose, Bld: 86 mg/dL (ref 70–99)
Potassium: 4.3 mEq/L (ref 3.5–5.3)

## 2011-09-24 ENCOUNTER — Encounter: Payer: Self-pay | Admitting: Internal Medicine

## 2011-09-24 ENCOUNTER — Ambulatory Visit (INDEPENDENT_AMBULATORY_CARE_PROVIDER_SITE_OTHER): Payer: Medicare Other | Admitting: Internal Medicine

## 2011-09-24 VITALS — BP 120/68 | HR 70 | Resp 14 | Wt 188.2 lb

## 2011-09-24 DIAGNOSIS — I10 Essential (primary) hypertension: Secondary | ICD-10-CM

## 2011-09-29 NOTE — Progress Notes (Signed)
  Subjective:    Patient ID: Katherine Rubio, female    DOB: 11-25-1922, 78 y.o.   MRN: [id number]  HPI Pt presents to clinic for followup of multiple medical problems. Last visit bp suboptimal. Began losartan-tolerates without side effect. BP reviewed normotensive. No active complaint.  Past Medical History  Diagnosis Date  . Depression   . Hyperlipidemia   . Hypertension   . Osteoarthritis   . Chronic low back pain    Past Surgical History  Procedure Date  . Back surgery   . Knee surgery     left knee replacement    reports that she has never smoked. She has never used smokeless tobacco. She reports that she does not drink alcohol. Her drug history not on file. family history includes Lung cancer in her brother; Osteoarthritis in her brother; Pulmonary fibrosis in her son; and Stroke in her father. No Known Allergies    Review of Systems see hpi     Objective:   Physical Exam  Nursing note and vitals reviewed. Constitutional: She appears well-developed and well-nourished. No distress.  HENT:  Head: Normocephalic and atraumatic.  Neurological: She is alert.  Skin: She is not diaphoretic.  Psychiatric: She has a normal mood and affect.          Assessment & Plan:

## 2011-09-29 NOTE — Assessment & Plan Note (Signed)
Normotensive and stable. Continue current regimen. Monitor bp as outpt and followup in clinic as scheduled.  

## 2011-11-28 ENCOUNTER — Other Ambulatory Visit: Payer: Self-pay | Admitting: Internal Medicine

## 2011-11-29 NOTE — Telephone Encounter (Signed)
Zoloft request [last refill 02.05.13 #[id number]]/[facility] Please advise.

## 2011-12-31 ENCOUNTER — Ambulatory Visit (INDEPENDENT_AMBULATORY_CARE_PROVIDER_SITE_OTHER): Payer: Medicare Other | Admitting: Internal Medicine

## 2011-12-31 ENCOUNTER — Encounter: Payer: Self-pay | Admitting: Internal Medicine

## 2011-12-31 VITALS — BP 110/78 | HR 62 | Temp 97.9°F | Resp 14 | Wt 188.8 lb

## 2011-12-31 DIAGNOSIS — M899 Disorder of bone, unspecified: Secondary | ICD-10-CM

## 2011-12-31 DIAGNOSIS — M8580 Other specified disorders of bone density and structure, unspecified site: Secondary | ICD-10-CM

## 2011-12-31 DIAGNOSIS — E785 Hyperlipidemia, unspecified: Secondary | ICD-10-CM

## 2011-12-31 DIAGNOSIS — I10 Essential (primary) hypertension: Secondary | ICD-10-CM

## 2011-12-31 MED ORDER — LOSARTAN POTASSIUM 25 MG PO TABS: 25.0000 mg | ORAL_TABLET | Freq: Every day | ORAL | Status: DC

## 2011-12-31 NOTE — Patient Instructions (Signed)
Please schedule fasting labs prior to next visit Cbc, tsh-401.9, chem7-v58.69, lipid/lft-272.4 and vitamin d- vit d deficiency

## 2012-01-10 NOTE — Progress Notes (Signed)
  Subjective:    Patient ID: Katherine Rubio, female    DOB: 1923/01/16, 78 y.o.   MRN: [id number]  HPI Pt presents to clinic for followup of multiple medical problems. Blood pressure reviewed as normotensive. Tolerates medication without adverse effect. No myalgias or abnormal liver function tests. Weight stable. No active complaints.  Past Medical History  Diagnosis Date  . Depression   . Hyperlipidemia   . Hypertension   . Osteoarthritis   . Chronic low back pain    Past Surgical History  Procedure Date  . Back surgery   . Knee surgery     left knee replacement    reports that she has never smoked. She has never used smokeless tobacco. She reports that she does not drink alcohol. Her drug history not on file. family history includes Lung cancer in her brother; Osteoarthritis in her brother; Pulmonary fibrosis in her son; and Stroke in her father. No Known Allergies    Review of Systems see hpi     Objective:   Physical Exam  Physical Exam  Nursing note and vitals reviewed. Constitutional: Appears well-developed and well-nourished. No distress.  HENT:  Head: Normocephalic and atraumatic.  Right Ear: External ear normal.  Left Ear: External ear normal.  Eyes: Conjunctivae are normal. No scleral icterus.  Neck: Neck supple. Carotid bruit is not present.  Cardiovascular: Normal rate, regular rhythm and normal heart sounds.  Exam reveals no gallop and no friction rub.   No murmur heard. Pulmonary/Chest: Effort normal and breath sounds normal. No respiratory distress. He has no wheezes. no rales.  Lymphadenopathy:    He has no cervical adenopathy.  Neurological:Alert.  Skin: Skin is warm and dry. Not diaphoretic.  Psychiatric: Has a normal mood and affect.        Assessment & Plan:

## 2012-01-10 NOTE — Assessment & Plan Note (Signed)
Stable. Continue statin therapy. Obtain fasting lipid profile and liver function test prior to next visit.

## 2012-01-10 NOTE — Assessment & Plan Note (Signed)
Stable. Obtain vitamin D level prior to next visit 

## 2012-01-10 NOTE — Assessment & Plan Note (Signed)
Normotensive and stable. Continue current regimen. Monitor bp as outpt and followup in clinic as scheduled. Obtain CBC and Chem-7 prior to next visit 

## 2012-02-04 ENCOUNTER — Other Ambulatory Visit: Payer: Self-pay | Admitting: Internal Medicine

## 2012-03-28 ENCOUNTER — Other Ambulatory Visit: Payer: Self-pay | Admitting: Internal Medicine

## 2012-03-28 DIAGNOSIS — Z79899 Other long term (current) drug therapy: Secondary | ICD-10-CM

## 2012-03-28 DIAGNOSIS — E785 Hyperlipidemia, unspecified: Secondary | ICD-10-CM

## 2012-03-28 DIAGNOSIS — E559 Vitamin D deficiency, unspecified: Secondary | ICD-10-CM

## 2012-03-28 DIAGNOSIS — I10 Essential (primary) hypertension: Secondary | ICD-10-CM

## 2012-03-28 LAB — CBC WITH DIFFERENTIAL/PLATELET
Basophils Relative: 1 % (ref 0–1)
Eosinophils Absolute: 0.4 10*3/uL (ref 0.0–0.7)
MCH: 31.6 pg (ref 26.0–34.0)
MCHC: 34.2 g/dL (ref 30.0–36.0)
Monocytes Relative: 10 % (ref 3–12)
Neutrophils Relative %: 46 % (ref 43–77)
Platelets: 219 10*3/uL (ref 150–400)

## 2012-03-28 LAB — HEPATIC FUNCTION PANEL
AST: 21 U/L (ref 0–37)
Bilirubin, Direct: 0.1 mg/dL (ref 0.0–0.3)
Total Bilirubin: 0.4 mg/dL (ref 0.3–1.2)

## 2012-03-28 LAB — BASIC METABOLIC PANEL
BUN: 22 mg/dL (ref 6–23)
CO2: 24 mEq/L (ref 19–32)
Chloride: 107 mEq/L (ref 96–112)
Creat: 0.93 mg/dL (ref 0.50–1.10)

## 2012-03-28 LAB — TSH: TSH: 4.641 u[IU]/mL — ABNORMAL HIGH (ref 0.350–4.500)

## 2012-03-28 LAB — LIPID PANEL: Total CHOL/HDL Ratio: 4 Ratio

## 2012-03-28 NOTE — Progress Notes (Signed)
Pt presented to the lab. Orders entered per 08/2011 office note as below:  Please schedule fasting labs prior to next visit  Cbc, tsh-401.9, chem7-v58.69, lipid/lft-272.4 and vitamin d- vit d deficiency

## 2012-03-29 LAB — VITAMIN D 25 HYDROXY (VIT D DEFICIENCY, FRACTURES): Vit D, 25-Hydroxy: 27 ng/mL — ABNORMAL LOW (ref 30–89)

## 2012-04-02 ENCOUNTER — Ambulatory Visit (INDEPENDENT_AMBULATORY_CARE_PROVIDER_SITE_OTHER): Payer: Medicare Other | Admitting: Internal Medicine

## 2012-04-02 ENCOUNTER — Encounter: Payer: Self-pay | Admitting: Internal Medicine

## 2012-04-02 VITALS — BP 140/70 | HR 72 | Wt 191.0 lb

## 2012-04-02 DIAGNOSIS — E559 Vitamin D deficiency, unspecified: Secondary | ICD-10-CM

## 2012-04-02 DIAGNOSIS — Z23 Encounter for immunization: Secondary | ICD-10-CM

## 2012-04-02 DIAGNOSIS — R946 Abnormal results of thyroid function studies: Secondary | ICD-10-CM

## 2012-04-02 DIAGNOSIS — I10 Essential (primary) hypertension: Secondary | ICD-10-CM

## 2012-04-02 DIAGNOSIS — R7989 Other specified abnormal findings of blood chemistry: Secondary | ICD-10-CM | POA: Insufficient documentation

## 2012-04-02 DIAGNOSIS — E785 Hyperlipidemia, unspecified: Secondary | ICD-10-CM

## 2012-04-02 NOTE — Patient Instructions (Signed)
Please start taking over the counter vitamin D 2000 units once a day

## 2012-04-02 NOTE — Assessment & Plan Note (Signed)
Recheck tsh with free t4

## 2012-04-02 NOTE — Progress Notes (Signed)
  Subjective:    Patient ID: Katherine Rubio, female    DOB: 1922/07/14, 78 y.o.   MRN: [id number]  HPI Pt presents to clinic for followup of multiple medical problems. Notes unintended wt gain. TSH reviewed minimally high. No known h/o thyroid d/o. Vitamin d level depressed with h/o bone loss. No recent fracture.   Past Medical History  Diagnosis Date  . Depression   . Hyperlipidemia   . Hypertension   . Osteoarthritis   . Chronic low back pain    Past Surgical History  Procedure Date  . Back surgery   . Knee surgery     left knee replacement    reports that she has never smoked. She has never used smokeless tobacco. She reports that she does not drink alcohol. Her drug history not on file. family history includes Lung cancer in her brother; Osteoarthritis in her brother; Pulmonary fibrosis in her son; and Stroke in her father. No Known Allergies    Review of Systems see hpi     Objective:   Physical Exam  Physical Exam  Nursing note and vitals reviewed. Constitutional: Appears well-developed and well-nourished. No distress.  HENT:  Head: Normocephalic and atraumatic.  Right Ear: External ear normal.  Left Ear: External ear normal.  Eyes: Conjunctivae are normal. No scleral icterus.  Neck: Neck supple. Carotid bruit is not present.  Cardiovascular: Normal rate, regular rhythm and normal heart sounds.  Exam reveals no gallop and no friction rub.   No murmur heard. Pulmonary/Chest: Effort normal and breath sounds normal. No respiratory distress. He has no wheezes. no rales.  Lymphadenopathy:    He has no cervical adenopathy.  Neurological:Alert.  Skin: Skin is warm and dry. Not diaphoretic.  Psychiatric: Has a normal mood and affect.        Assessment & Plan:

## 2012-04-02 NOTE — Assessment & Plan Note (Signed)
Borderline elevation. Continue current regimen. If elevations persist then titrate medication further

## 2012-04-02 NOTE — Assessment & Plan Note (Signed)
Take otc vitamin d 2000 units qd.

## 2012-04-02 NOTE — Assessment & Plan Note (Signed)
Good control. Continue statin tx 

## 2012-04-03 LAB — TSH: TSH: 3.139 u[IU]/mL (ref 0.350–4.500)

## 2012-04-03 LAB — T4, FREE: Free T4: 0.9 ng/dL (ref 0.80–1.80)

## 2012-04-04 ENCOUNTER — Encounter (HOSPITAL_COMMUNITY): Payer: Self-pay | Admitting: Emergency Medicine

## 2012-04-04 ENCOUNTER — Emergency Department (HOSPITAL_COMMUNITY)
Admission: EM | Admit: 2012-04-04 | Discharge: 2012-04-05 | Disposition: A | Discharge status: Home / Self Care | Payer: Medicare Other | Attending: Emergency Medicine | Admitting: Emergency Medicine

## 2012-04-04 ENCOUNTER — Emergency Department (HOSPITAL_COMMUNITY): Payer: Medicare Other

## 2012-04-04 DIAGNOSIS — E785 Hyperlipidemia, unspecified: Secondary | ICD-10-CM | POA: Insufficient documentation

## 2012-04-04 DIAGNOSIS — Z79899 Other long term (current) drug therapy: Secondary | ICD-10-CM | POA: Insufficient documentation

## 2012-04-04 DIAGNOSIS — F329 Major depressive disorder, single episode, unspecified: Secondary | ICD-10-CM | POA: Insufficient documentation

## 2012-04-04 DIAGNOSIS — G8929 Other chronic pain: Secondary | ICD-10-CM | POA: Insufficient documentation

## 2012-04-04 DIAGNOSIS — F3289 Other specified depressive episodes: Secondary | ICD-10-CM | POA: Insufficient documentation

## 2012-04-04 DIAGNOSIS — M1990 Unspecified osteoarthritis, unspecified site: Secondary | ICD-10-CM | POA: Insufficient documentation

## 2012-04-04 DIAGNOSIS — I10 Essential (primary) hypertension: Secondary | ICD-10-CM | POA: Insufficient documentation

## 2012-04-04 DIAGNOSIS — M545 Low back pain, unspecified: Secondary | ICD-10-CM | POA: Insufficient documentation

## 2012-04-04 DIAGNOSIS — Z9889 Other specified postprocedural states: Secondary | ICD-10-CM | POA: Insufficient documentation

## 2012-04-04 DIAGNOSIS — M549 Dorsalgia, unspecified: Secondary | ICD-10-CM

## 2012-04-04 DIAGNOSIS — M546 Pain in thoracic spine: Secondary | ICD-10-CM | POA: Insufficient documentation

## 2012-04-04 LAB — BASIC METABOLIC PANEL
CO2: 18 mEq/L — ABNORMAL LOW (ref 19–32)
Chloride: 106 mEq/L (ref 96–112)
GFR calc Af Amer: 68 mL/min — ABNORMAL LOW (ref >90)
Potassium: 4.1 mEq/L (ref 3.5–5.1)
Sodium: 137 mEq/L (ref 135–145)

## 2012-04-04 LAB — PRO B NATRIURETIC PEPTIDE: Pro B Natriuretic peptide (BNP): 277 pg/mL (ref 0–450)

## 2012-04-04 LAB — POCT I-STAT TROPONIN I: Troponin i, poc: 0 ng/mL (ref 0.00–0.08)

## 2012-04-04 NOTE — ED Notes (Signed)
C/o pain across middle of back that started yesterday around noon while shopping.  States pain now radiates across upper abd since this afternoon.  States she is unable to take a deep breath and pain is worse when bending over.  Denies sob.  Reports nausea.

## 2012-04-05 ENCOUNTER — Emergency Department (HOSPITAL_COMMUNITY): Payer: Medicare Other

## 2012-04-05 LAB — CBC WITH DIFFERENTIAL/PLATELET
Basophils Absolute: 0 10*3/uL (ref 0.0–0.1)
Eosinophils Relative: 4 % (ref 0–5)
Lymphocytes Relative: 22 % (ref 12–46)
Lymphs Abs: 2 10*3/uL (ref 0.7–4.0)
MCV: 93.2 fL (ref 78.0–100.0)
Neutrophils Relative %: 61 % (ref 43–77)
Platelets: 181 10*3/uL (ref 150–400)
RBC: 3.81 MIL/uL — ABNORMAL LOW (ref 3.87–5.11)
RDW: 13.1 % (ref 11.5–15.5)
WBC: 8.8 10*3/uL (ref 4.0–10.5)

## 2012-04-05 MED ORDER — NAPROXEN 500 MG PO TABS: 500.0000 mg | ORAL_TABLET | Freq: Two times a day (BID) | ORAL | Status: DC

## 2012-04-05 MED ORDER — MORPHINE SULFATE 4 MG/ML IJ SOLN
2.0000 mg | Freq: Once | INTRAMUSCULAR | Status: AC
  Administered 2012-04-05: 2 mg via INTRAVENOUS
  Filled 2012-04-05: qty 1

## 2012-04-05 MED ORDER — METHOCARBAMOL 500 MG PO TABS: 500.0000 mg | ORAL_TABLET | Freq: Two times a day (BID) | ORAL | Status: DC

## 2012-04-05 MED ORDER — IOHEXOL 350 MG/ML SOLN
100.0000 mL | Freq: Once | INTRAVENOUS | Status: AC | PRN
  Administered 2012-04-05: 100 mL via INTRAVENOUS

## 2012-04-05 MED ORDER — METHOCARBAMOL 500 MG PO TABS
500.0000 mg | ORAL_TABLET | Freq: Once | ORAL | Status: AC
  Administered 2012-04-05: 500 mg via ORAL
  Filled 2012-04-05: qty 1

## 2012-04-05 NOTE — ED Notes (Signed)
Phlebotomist at bedside to collect blood samples.

## 2012-04-05 NOTE — ED Notes (Signed)
Patient given copy of discharge paperwork; went over discharge instructions with patient.  Patient instructed to take prescriptions as directed, to not drive while taking prescriptions, to follow up with referral, and to return to the ED for new, worsening, or concerning symptoms.

## 2012-04-05 NOTE — ED Notes (Signed)
DG chest x-ray completed; results back at 2329.

## 2012-04-05 NOTE — ED Notes (Signed)
Dr. Hyacinth Meeker currently at bedside.

## 2012-04-05 NOTE — ED Provider Notes (Signed)
History     CSN: [id number]  Arrival date & time 04/04/12  2129   First MD Initiated Contact with Patient 04/05/12 0021      Chief Complaint  Patient presents with  . Chest Pain    (Consider location/radiation/quality/duration/timing/severity/associated sxs/prior treatment) HPI Comments: 78 year old female with a history of hypertension and chronic low back pain presents with 24 hours of upper back pain located between her shoulder blades. This was acute in onset, occurred yesterday while she [place], has been persistent, gets worse when she bends over and is associated with intermittent shortness of breath. She denies chest pain, leg swelling, numbness weakness or tingling. She does not have any fevers, cough, nausea or vomiting. She states that the pain has been constant and has not left her since it started. She does not have any history of myocardial infarction, pulmonary embolism, venous thromboembolism or any cardiac or pulmonary problems.  Patient is a 78 y.o. female presenting with chest pain. The history is provided by the patient and a relative.  Chest Pain     Past Medical History  Diagnosis Date  . Depression   . Hyperlipidemia   . Hypertension   . Osteoarthritis   . Chronic low back pain     Past Surgical History  Procedure Date  . Back surgery   . Knee surgery     left knee replacement    Family History  Problem Relation Age of Onset  . Lung cancer Brother     deceased  . Stroke Father     deceased age 41  . Pulmonary fibrosis Son     deceased  . Osteoarthritis Brother     History  Substance Use Topics  . Smoking status: Never Smoker   . Smokeless tobacco: Never Used  . Alcohol Use: No    OB History    Grav Para Term Preterm Abortions TAB SAB Ect Mult Living                  Review of Systems  Cardiovascular: Positive for chest pain.  All other systems reviewed and are negative.    Allergies  Review of  patient's allergies indicates no known allergies.  Home Medications   Current Outpatient Rx  Name  Route  Sig  Dispense  Refill  . ALPRAZOLAM 0.25 MG PO TABS   Oral   Take 0.25 mg by mouth 2 (two) times daily as needed. 1/2 to 1 tablet by mouth two times a day as needed for anxiety         . BISOPROLOL-HYDROCHLOROTHIAZIDE 2.5-6.25 MG PO TABS      TAKE 1 TABLET BY MOUTH DAILY   90 tablet   1   . CHOLECALCIFEROL PO   Oral   Take 1 tablet by mouth daily.         Marland Kitchen LOSARTAN POTASSIUM 25 MG PO TABS   Oral   Take 1 tablet (25 mg total) by mouth daily.   30 tablet   6   . ADULT MULTIVITAMIN W/MINERALS CH   Oral   Take 1 tablet by mouth daily.         Marland Kitchen PRAVASTATIN SODIUM 20 MG PO TABS   Oral   Take 1 tablet (20 mg total) by mouth at bedtime.   90 tablet   1   . SERTRALINE HCL 50 MG PO TABS      TAKE 1 TABLET (50 MG TOTAL) BY MOUTH DAILY.  90 tablet   1   . VITAMIN C 500 MG PO TABS   Oral   Take 500 mg by mouth daily.         Marland Kitchen METHOCARBAMOL 500 MG PO TABS   Oral   Take 1 tablet (500 mg total) by mouth 2 (two) times daily.   20 tablet   0   . NAPROXEN 500 MG PO TABS   Oral   Take 1 tablet (500 mg total) by mouth 2 (two) times daily with a meal.   30 tablet   0     BP 132/110  Pulse 66  Temp 98.7 F (37.1 C) (Oral)  Resp 20  SpO2 96%  Physical Exam  Nursing note and vitals reviewed. Constitutional: She appears well-developed and well-nourished. No distress.  HENT:  Head: Normocephalic and atraumatic.  Mouth/Throat: Oropharynx is clear and moist. No oropharyngeal exudate.  Eyes: Conjunctivae normal and EOM are normal. Pupils are equal, round, and reactive to light. Right eye exhibits no discharge. Left eye exhibits no discharge. No scleral icterus.  Neck: Normal range of motion. Neck supple. No JVD present. No thyromegaly present.  Cardiovascular: Normal rate, regular rhythm and intact distal pulses.  Exam reveals no gallop and no friction  rub.   Murmur (soft systolic murmur) heard. Pulmonary/Chest: Effort normal and breath sounds normal. No respiratory distress. She has no wheezes. She has no rales.  Abdominal: Soft. Bowel sounds are normal. She exhibits no distension and no mass. There is no tenderness.  Musculoskeletal: Normal range of motion. She exhibits edema (scant edema at the ankles bilaterally, no asymmetry). She exhibits no tenderness.  Lymphadenopathy:    She has no cervical adenopathy.  Neurological: She is alert. Coordination normal.  Skin: Skin is warm and dry. No rash noted. No erythema.  Psychiatric: She has a normal mood and affect. Her behavior is normal.    ED Course  Procedures (including critical care time)  Labs Reviewed  BASIC METABOLIC PANEL - Abnormal; Notable for the following:    CO2 18 (*)     BUN 30 (*)     GFR calc non Af Amer 59 (*)     GFR calc Af Amer 68 (*)     All other components within normal limits  CBC WITH DIFFERENTIAL - Abnormal; Notable for the following:    RBC 3.81 (*)     HCT 35.5 (*)     Monocytes Relative 13 (*)     Monocytes Absolute 1.1 (*)     All other components within normal limits  PRO B NATRIURETIC PEPTIDE  POCT I-STAT TROPONIN I   Dg Chest 2 View  04/04/2012  *RADIOLOGY REPORT*  Clinical Data: Pain across the middle of the back.  Now pain radiating across the upper abdomen.  Worse on inspiration.  CHEST - 2 VIEW  Comparison: 01/02/2011.  Findings: The heart size and pulmonary vascularity are normal. The lungs appear clear and expanded without focal air space disease or consolidation. No blunting of the costophrenic angles.  Calcified and tortuous aorta.  No pneumothorax.  Mediastinal contours appear intact.  Degenerative changes in the spine and shoulders.  Stable wedge deformity of lower thoracic vertebra.  IMPRESSION: No evidence of active pulmonary disease.   Original Report Authenticated By: Burman Nieves, M.D.    Ct Angio Chest Pe W/cm &/or Wo  Cm  04/05/2012  *RADIOLOGY REPORT*  Clinical Data: Chest pain, back pain near shoulder blades, question pulmonary embolism versus dissection, history hypertension,  hyperlipidemia  CT ANGIOGRAPHY CHEST  Technique:  Multidetector CT imaging of the chest using the standard protocol during bolus administration of intravenous contrast. Multiplanar reconstructed images including MIPs were obtained and reviewed to evaluate the vascular anatomy.  Contrast: OMNIPAQUE IOHEXOL 350 MG/ML SOLN  Comparison: None  Findings: Scattered atherosclerotic calcifications aorta and coronary arteries. Aorta normal caliber without aneurysm or gross evidence of dissection, though the aorta is suboptimally opacified. Pulmonary arteries well opacified and patent. No evidence of pulmonary embolism. Few scattered normal-sized mediastinal lymph nodes without adenopathy. Visualized portion of upper abdomen demonstrates mild intrahepatic biliary dilatation.  Atelectasis at lung bases. Scattered respiratory motion artifacts. Minimal nonspecific ground-glass infiltrate identified in left upper lobe. Remaining lungs clear. No pleural effusion or pneumothorax. Osseous demineralization.  IMPRESSION: No evidence of pulmonary embolism. Scattered atherosclerotic disease without gross evidence of aortic dissection. Bibasilar atelectasis with minimal nonspecific infiltrate in the left upper lobe. Only a small portion of the liver is imaged and demonstrates central intrahepatic biliary dilatation; recommend correlation with patient history and LFTs.   Original Report Authenticated By: Ulyses Southward, M.D.      1. Back pain       MDM  The patient is well-appearing, she does not have any signs of myocardial infarction on her EKG which is unchanged since last year, her troponin is normal as is her metabolic panel, her BNP is 277 and her chest x-ray shows no signs of active pulmonary disease. The patient had this acute onset of pain between her  shoulder blades which is constant, she must be ruled out for an aortic dissection. At this time she does not have a tachycardia at her oxygen saturation on my exam is 98% on room air. She does not have any respiratory distress.   ED ECG REPORT  I personally interpreted this EKG   Date: 04/05/2012   Rate: 82  Rhythm: normal sinus rhythm  QRS Axis: normal  Intervals: normal  ST/T Wave abnormalities: normal  Conduction Disutrbances:none  Narrative Interpretation:   Old EKG Reviewed: Compared to 12/18/2010, no significant change  On repeat exam the patient is doing well, she has a persistent pain between her shoulder blades. She does not have coughing, shortness of breath or fevers. Her CT scan does not show any signs of dissection or pulmonary embolism and has a very minimal nonspecific infiltrate which at this time I do not believe is related to any infectious process given the patient's lack of symptoms, acute onset of her pain, lack of leukocytosis or fever. She will be given a slight amount of morphine, Robaxin and discharged with pain medications to followup with her family Dr. I discussed these findings with the patient, her family members and they have expressed her understanding for the indications for return.    Vida Roller, MD 04/05/12 [phone]

## 2012-04-05 NOTE — ED Notes (Signed)
Dr Miller at bedside. 

## 2012-04-05 NOTE — ED Notes (Addendum)
Patient back from CT; currently resting quietly in bed.  No respiratory or acute distress noted.  Patient updated on plan of care; informed patient that we are currently waiting EDP to talk about CT results.  Patient denies any needs at this time; will continue to monitor.

## 2012-04-18 ENCOUNTER — Telehealth: Payer: Self-pay | Admitting: Internal Medicine

## 2012-04-18 ENCOUNTER — Ambulatory Visit (HOSPITAL_BASED_OUTPATIENT_CLINIC_OR_DEPARTMENT_OTHER)
Admission: RE | Admit: 2012-04-18 | Discharge: 2012-04-18 | Disposition: A | Discharge status: Home / Self Care | Payer: Medicare Other | Source: Ambulatory Visit | Attending: Family | Admitting: Family

## 2012-04-18 ENCOUNTER — Ambulatory Visit (INDEPENDENT_AMBULATORY_CARE_PROVIDER_SITE_OTHER): Payer: Medicare Other | Admitting: Family

## 2012-04-18 ENCOUNTER — Encounter: Payer: Self-pay | Admitting: Family

## 2012-04-18 VITALS — BP 120/68 | HR 68 | Temp 98.0°F | Resp 18 | Wt 191.5 lb

## 2012-04-18 DIAGNOSIS — IMO0002 Reserved for concepts with insufficient information to code with codable children: Secondary | ICD-10-CM

## 2012-04-18 DIAGNOSIS — K839 Disease of biliary tract, unspecified: Secondary | ICD-10-CM

## 2012-04-18 DIAGNOSIS — M8448XA Pathological fracture, other site, initial encounter for fracture: Secondary | ICD-10-CM | POA: Insufficient documentation

## 2012-04-18 DIAGNOSIS — M549 Dorsalgia, unspecified: Secondary | ICD-10-CM

## 2012-04-18 DIAGNOSIS — R9389 Abnormal findings on diagnostic imaging of other specified body structures: Secondary | ICD-10-CM

## 2012-04-18 DIAGNOSIS — K8020 Calculus of gallbladder without cholecystitis without obstruction: Secondary | ICD-10-CM | POA: Insufficient documentation

## 2012-04-18 LAB — CBC WITH DIFFERENTIAL/PLATELET
Basophils Absolute: 0.1 10*3/uL (ref 0.0–0.1)
Eosinophils Relative: 7 % — ABNORMAL HIGH (ref 0–5)
HCT: 36 % (ref 36.0–46.0)
Lymphocytes Relative: 26 % (ref 12–46)
Lymphs Abs: 2.2 10*3/uL (ref 0.7–4.0)
MCV: 90.7 fL (ref 78.0–100.0)
Monocytes Absolute: 0.7 10*3/uL (ref 0.1–1.0)
Neutro Abs: 4.7 10*3/uL (ref 1.7–7.7)
Platelets: 263 10*3/uL (ref 150–400)
RBC: 3.97 MIL/uL (ref 3.87–5.11)
RDW: 13.5 % (ref 11.5–15.5)
WBC: 8.3 10*3/uL (ref 4.0–10.5)

## 2012-04-18 LAB — HEPATIC FUNCTION PANEL
Alkaline Phosphatase: 79 U/L (ref 39–117)
Bilirubin, Direct: 0.1 mg/dL (ref 0.0–0.3)
Indirect Bilirubin: 0.3 mg/dL (ref 0.0–0.9)

## 2012-04-18 MED ORDER — HYDROCODONE-ACETAMINOPHEN 5-500 MG PO TABS: 1.0000 | ORAL_TABLET | Freq: Three times a day (TID) | ORAL | Status: DC | PRN

## 2012-04-18 NOTE — Progress Notes (Signed)
Subjective:    Patient ID: Katherine Rubio, female    DOB: 1923/01/18, 78 y.o.   MRN: [id number]  HPI  [name] is an 79 yr old female who presents today for ED follow up.  She was initially seen in the ED on 12/13 due to back pain between her shoulder blades.  Tryponin was negative and BNP was mildly elevated in the high 200's.  A CT of the chest was performed and was negative for PE or Aortic dissection.  CT did note bibasilar atelectasis with minimal nonspecific infiltrate in the left upper lobe. Only a small portion of the liver is imaged and demonstrates central intrahepatic biliary dilatation.  LFT's were not performed.  She reports not relief from the robaxin and naproxen which were prescribed in the ED.    She reports that her pain is between her shoulder blades. Reports this is different from her chronic low back pain.  Pain is worse with movement.  "takes her breath away."  She denies associated nausea, abdominal pain, fever or cough.  She reports generalizes weakness in her legs. She denies known injury.   Review of Systems See HPI  Past Medical History  Diagnosis Date  . Depression   . Hyperlipidemia   . Hypertension   . Osteoarthritis   . Chronic low back pain     History   Social History  . Marital Status: Widowed    Spouse Name: N/A    Number of Children: N/A  . Years of Education: N/A   Occupational History  . Not on file.   Social History Main Topics  . Smoking status: Never Smoker   . Smokeless tobacco: Never Used  . Alcohol Use: No  . Drug Use: No  . Sexually Active: Not on file   Other Topics Concern  . Not on file   Social History Narrative   WidowedRetired as a Scientist, physiological.   Supportive daughterNever SmokedAlcohol use-no     Past Surgical History  Procedure Date  . Back surgery   . Knee surgery     left knee replacement    Family History  Problem Relation Age of Onset  . Lung cancer Brother     deceased  . Stroke Father    deceased age 16  . Pulmonary fibrosis Son     deceased  . Osteoarthritis Brother     No Known Allergies  Current Outpatient Prescriptions on File Prior to Visit  Medication Sig Dispense Refill  . ALPRAZolam (XANAX) 0.25 MG tablet Take 0.25 mg by mouth 2 (two) times daily as needed. 1/2 to 1 tablet by mouth two times a day as needed for anxiety      . bisoprolol-hydrochlorothiazide (ZIAC) 2.5-6.25 MG per tablet TAKE 1 TABLET BY MOUTH DAILY  90 tablet  1  . CHOLECALCIFEROL PO Take 1 tablet by mouth daily.      Marland Kitchen losartan (COZAAR) 25 MG tablet Take 1 tablet (25 mg total) by mouth daily.  30 tablet  6  . methocarbamol (ROBAXIN) 500 MG tablet Take 1 tablet (500 mg total) by mouth 2 (two) times daily.  20 tablet  0  . Multiple Vitamin (MULTIVITAMIN WITH MINERALS) TABS Take 1 tablet by mouth daily.      . naproxen (NAPROSYN) 500 MG tablet Take 1 tablet (500 mg total) by mouth 2 (two) times daily with a meal.  30 tablet  0  . pravastatin (PRAVACHOL) 20 MG tablet Take 1 tablet (20 mg total) by  mouth at bedtime.  90 tablet  1  . sertraline (ZOLOFT) 50 MG tablet TAKE 1 TABLET (50 MG TOTAL) BY MOUTH DAILY.  90 tablet  1  . vitamin C (ASCORBIC ACID) 500 MG tablet Take 500 mg by mouth daily.        BP 120/68  Pulse 68  Temp 98 F (36.7 C) (Oral)  Resp 18  Wt 191 lb 8 oz (86.864 kg)  SpO2 99%       Objective:   Physical Exam  Constitutional: She is oriented to person, place, and time. She appears well-developed and well-nourished. No distress.  HENT:  Head: Normocephalic and atraumatic.  Right Ear: Tympanic membrane and ear canal normal.  Left Ear: Tympanic membrane and ear canal normal.  Mouth/Throat: No posterior oropharyngeal edema or posterior oropharyngeal erythema.  Cardiovascular: Normal rate and regular rhythm.   No murmur heard. Pulmonary/Chest: Effort normal and breath sounds normal. No respiratory distress. She has no wheezes. She has no rales. She exhibits no tenderness.    Abdominal: Soft. Bowel sounds are normal. She exhibits no distension. There is no tenderness. There is no rebound and no guarding.  Lymphadenopathy:    She has no cervical adenopathy.  Neurological: She is alert and oriented to person, place, and time.  Skin: Skin is warm and dry.  Psychiatric: She has a normal mood and affect. Her behavior is normal. Thought content normal.          Assessment & Plan:

## 2012-04-18 NOTE — Patient Instructions (Signed)
Please complete your lab work prior to leaving. Complete imaging studies on the first floor. Follow up with Dr. Rodena Medin in 2 weeks.

## 2012-04-18 NOTE — Telephone Encounter (Signed)
Spoke with pt's son and reviewed finding of new compression fracture- T6 and plans to refer to Neurosurgery.

## 2012-04-18 NOTE — Telephone Encounter (Signed)
When you get the ultrasound results and the lab results please call the son not Katherine Rubio.

## 2012-04-18 NOTE — Assessment & Plan Note (Signed)
Differential includes:  Musculoskeletal back pain vs. Compression fracture, referred pain from GI source, pulmonary cause such as underlying pneumonia.  Will obtain thoracic x ray to exclude compression fracture.  CXR to re-evaluated LUL infiltrate noted on CT chest, abdominal US to further evaluate gallbladder and hepatic duct dilatation.  Will also obtain lipase and LFT's.  Rx provided for vicodin today. She and son are advised that this may cause drowsiness and that she should not drive after taking.

## 2012-04-24 ENCOUNTER — Telehealth: Payer: Self-pay | Admitting: *Deleted

## 2012-04-24 MED ORDER — HYDROCODONE-ACETAMINOPHEN 5-500 MG PO TABS: 1.5000 | ORAL_TABLET | Freq: Three times a day (TID) | ORAL | Status: DC | PRN

## 2012-04-24 NOTE — Telephone Encounter (Signed)
Refill called to pharmacy voicemail. Notified Alexis Frock of completion.

## 2012-04-24 NOTE — Telephone Encounter (Signed)
Ok for those directions #60

## 2012-04-24 NOTE — Telephone Encounter (Signed)
Received call from pt's nephew, Katherine Rubio stating pt has enough Vicodin to last through tomorrow. Reports that he has been giving pt 1 1/2 tabs every 6-8 hours around the clock and it has been controlling her pain. Pt has appt with Dr Lovell Sheehan (neurosurgeon) on 05/06/12 and he doesn't want her to be without pain medication during that time. Please advise re: refill?

## 2012-04-25 ENCOUNTER — Encounter: Payer: Self-pay | Admitting: Family

## 2012-05-02 ENCOUNTER — Ambulatory Visit (INDEPENDENT_AMBULATORY_CARE_PROVIDER_SITE_OTHER): Payer: Medicare Other | Admitting: Internal Medicine

## 2012-05-02 VITALS — BP 124/72 | HR 68 | Temp 98.3°F | Resp 14 | Wt 187.8 lb

## 2012-05-02 DIAGNOSIS — H01009 Unspecified blepharitis unspecified eye, unspecified eyelid: Secondary | ICD-10-CM

## 2012-05-02 DIAGNOSIS — T148XXA Other injury of unspecified body region, initial encounter: Secondary | ICD-10-CM

## 2012-05-02 DIAGNOSIS — K5900 Constipation, unspecified: Secondary | ICD-10-CM

## 2012-05-02 DIAGNOSIS — IMO0002 Reserved for concepts with insufficient information to code with codable children: Secondary | ICD-10-CM

## 2012-05-02 DIAGNOSIS — H01001 Unspecified blepharitis right upper eyelid: Secondary | ICD-10-CM

## 2012-05-02 DIAGNOSIS — J40 Bronchitis, not specified as acute or chronic: Secondary | ICD-10-CM

## 2012-05-02 MED ORDER — SULFACETAMIDE SODIUM 10 % OP OINT: TOPICAL_OINTMENT | OPHTHALMIC | Status: DC

## 2012-05-04 DIAGNOSIS — H01001 Unspecified blepharitis right upper eyelid: Secondary | ICD-10-CM | POA: Insufficient documentation

## 2012-05-04 DIAGNOSIS — IMO0002 Reserved for concepts with insufficient information to code with codable children: Secondary | ICD-10-CM | POA: Insufficient documentation

## 2012-05-04 DIAGNOSIS — K5900 Constipation, unspecified: Secondary | ICD-10-CM | POA: Insufficient documentation

## 2012-05-04 NOTE — Assessment & Plan Note (Signed)
Attempt opth abx ointment. Warm compresses prn. Followup if no improvement or worsening.

## 2012-05-04 NOTE — Assessment & Plan Note (Signed)
Continue hydrocodone prn. Has surgery appt in near future. At follow up discuss possible f/u bone density

## 2012-05-04 NOTE — Progress Notes (Signed)
  Subjective:    Patient ID: Katherine Rubio, female    DOB: May 04, 1922, 79 y.o.   MRN: [id number]  HPI Pt presents to clinic for followup of multiple medical problems. Recent dx of T6 compression fx with initial signifcant pain. Pain now improved mildly taking hydrocodone. Does note constipation without distention, nausea or vomitting. Notes 2 day h/o right eye lid redness and swelling. No affect on vision.   Past Medical History  Diagnosis Date  . Depression   . Hyperlipidemia   . Hypertension   . Osteoarthritis   . Chronic low back pain    Past Surgical History  Procedure Date  . Back surgery   . Knee surgery     left knee replacement    reports that she has never smoked. She has never used smokeless tobacco. She reports that she does not drink alcohol or use illicit drugs. family history includes Lung cancer in her brother; Osteoarthritis in her brother; Pulmonary fibrosis in her son; and Stroke in her father. No Known Allergies    Review of Systems see hpi     Objective:   Physical Exam  Nursing note and vitals reviewed. Constitutional: She appears well-developed and well-nourished. No distress.  HENT:  Head: Normocephalic and atraumatic.  Right Ear: External ear normal.  Left Ear: External ear normal.  Eyes: Conjunctivae normal and EOM are normal. Pupils are equal, round, and reactive to light. Right eye exhibits discharge. Left eye exhibits no discharge. No scleral icterus.       Right upper eyelid mildy red and swollen  Abdominal: Soft. Bowel sounds are normal. She exhibits no distension. There is no tenderness.  Neurological: She is alert.  Skin: She is not diaphoretic.  Psychiatric: She has a normal mood and affect.          Assessment & Plan:

## 2012-05-04 NOTE — Assessment & Plan Note (Signed)
Begin colace bid and miralax prn

## 2012-05-06 ENCOUNTER — Other Ambulatory Visit: Payer: Self-pay | Admitting: Neurosurgery

## 2012-05-06 DIAGNOSIS — S22000A Wedge compression fracture of unspecified thoracic vertebra, initial encounter for closed fracture: Secondary | ICD-10-CM

## 2012-05-07 ENCOUNTER — Telehealth: Payer: Self-pay | Admitting: *Deleted

## 2012-05-07 MED ORDER — HYDROCODONE-ACETAMINOPHEN 5-325 MG PO TABS: 1.5000 | ORAL_TABLET | Freq: Three times a day (TID) | ORAL | Status: DC | PRN

## 2012-05-07 NOTE — Telephone Encounter (Signed)
Received notification via mail RE: pt's Hydrocodone-APAP 5-500 mg, which is no longer available at this strength, pharmacy cannot make notice in their system & Rx is not due for refill until 02.01.14; made change in EMR to 5-325 mg for next renewal per [facility]

## 2012-05-08 ENCOUNTER — Encounter (HOSPITAL_COMMUNITY): Payer: Self-pay

## 2012-05-08 ENCOUNTER — Encounter (HOSPITAL_COMMUNITY)
Admission: RE | Admit: 2012-05-08 | Discharge: 2012-05-08 | Disposition: A | Discharge status: Home / Self Care | Payer: Medicare Other | Source: Ambulatory Visit | Attending: Neurosurgery | Admitting: Neurosurgery

## 2012-05-08 HISTORY — DX: Gastro-esophageal reflux disease without esophagitis: K21.9

## 2012-05-08 HISTORY — DX: Other specified postprocedural states: Z98.890

## 2012-05-08 HISTORY — DX: Other specified postprocedural states: R11.2

## 2012-05-08 LAB — CBC
HCT: 36.8 % (ref 36.0–46.0)
MCH: 32.2 pg (ref 26.0–34.0)
MCV: 94.1 fL (ref 78.0–100.0)
Platelets: 258 10*3/uL (ref 150–400)
RDW: 12.8 % (ref 11.5–15.5)
WBC: 7.3 10*3/uL (ref 4.0–10.5)

## 2012-05-08 LAB — BASIC METABOLIC PANEL
BUN: 27 mg/dL — ABNORMAL HIGH (ref 6–23)
CO2: 24 mEq/L (ref 19–32)
Calcium: 10.1 mg/dL (ref 8.4–10.5)
Chloride: 105 mEq/L (ref 96–112)
Creatinine, Ser: 0.77 mg/dL (ref 0.50–1.10)
GFR calc Af Amer: 85 mL/min — ABNORMAL LOW (ref >90)

## 2012-05-08 NOTE — Pre-Procedure Instructions (Signed)
Katherine Rubio  05/08/2012   Your procedure is scheduled on: Thursday 05/15/12   Report to [facility] at 1015 AM.  Call this number if you have problems the morning of surgery: [phone]   Remember:   Do not eat food or drink liquids after midnight.   Take these medicines the morning of surgery with A SIP OF WATER: XANAX, BISOPROLOL(ZIAC), HYDROCODONE, SERTRALINE(ZOLOFT) (STOP ASPIRIN, COUMADIN, PLAVIX, EFFIENT, HERBAL MEDICINES)   Do not wear jewelry, make-up or nail polish.  Do not wear lotions, powders, or perfumes. You may wear deodorant.  Do not shave 48 hours prior to surgery. Men may shave face and neck.  Do not bring valuables to the hospital.  Contacts, dentures or bridgework may not be worn into surgery.  Leave suitcase in the car. After surgery it may be brought to your room.  For patients admitted to the hospital, checkout time is 11:00 AM the day of  discharge.   Patients discharged the day of surgery will not be allowed to drive  home.  Name and phone number of your driver:  Special Instructions: Shower using CHG 2 nights before surgery and the night before surgery.  If you shower the day of surgery use CHG.  Use special wash - you have one bottle of CHG for all showers.  You should use approximately 1/3 of the bottle for each shower.   Please read over the following fact sheets that you were given: Pain Booklet, Coughing and Deep Breathing, MRSA Information and Surgical Site Infection Prevention

## 2012-05-12 ENCOUNTER — Ambulatory Visit
Admission: RE | Admit: 2012-05-12 | Discharge: 2012-05-12 | Disposition: A | Discharge status: Home / Self Care | Payer: Medicare Other | Source: Ambulatory Visit | Attending: Neurosurgery | Admitting: Neurosurgery

## 2012-05-12 DIAGNOSIS — S22000A Wedge compression fracture of unspecified thoracic vertebra, initial encounter for closed fracture: Secondary | ICD-10-CM

## 2012-05-13 ENCOUNTER — Other Ambulatory Visit: Payer: Self-pay | Admitting: Neurosurgery

## 2012-05-13 ENCOUNTER — Ambulatory Visit
Admission: RE | Admit: 2012-05-13 | Discharge: 2012-05-13 | Disposition: A | Discharge status: Home / Self Care | Payer: Medicare Other | Source: Ambulatory Visit | Attending: Neurosurgery | Admitting: Neurosurgery

## 2012-05-13 DIAGNOSIS — S22000A Wedge compression fracture of unspecified thoracic vertebra, initial encounter for closed fracture: Secondary | ICD-10-CM

## 2012-05-14 ENCOUNTER — Other Ambulatory Visit: Payer: Medicare Other

## 2012-05-14 MED ORDER — CEFAZOLIN SODIUM-DEXTROSE 2-3 GM-% IV SOLR
2.0000 g | INTRAVENOUS | Status: AC
  Administered 2012-05-15: 2 g via INTRAVENOUS
  Filled 2012-05-14: qty 50

## 2012-05-15 ENCOUNTER — Encounter (HOSPITAL_COMMUNITY)
Admission: RE | Disposition: A | Discharge status: Home / Self Care | Payer: Self-pay | Source: Ambulatory Visit | Attending: Neurosurgery

## 2012-05-15 ENCOUNTER — Encounter (HOSPITAL_COMMUNITY): Payer: Self-pay | Admitting: Certified Registered Nurse Anesthetist

## 2012-05-15 ENCOUNTER — Encounter (HOSPITAL_COMMUNITY): Payer: Self-pay | Admitting: Anesthesiology

## 2012-05-15 ENCOUNTER — Inpatient Hospital Stay (HOSPITAL_COMMUNITY): Payer: Medicare Other | Admitting: Anesthesiology

## 2012-05-15 ENCOUNTER — Encounter (HOSPITAL_COMMUNITY): Payer: Self-pay | Admitting: *Deleted

## 2012-05-15 ENCOUNTER — Observation Stay (HOSPITAL_COMMUNITY): Payer: Medicare Other

## 2012-05-15 ENCOUNTER — Ambulatory Visit (HOSPITAL_COMMUNITY)
Admission: RE | Admit: 2012-05-15 | Discharge: 2012-05-16 | Disposition: A | Discharge status: Home / Self Care | Payer: Medicare Other | Source: Ambulatory Visit | Attending: Neurosurgery | Admitting: Neurosurgery

## 2012-05-15 DIAGNOSIS — Z01811 Encounter for preprocedural respiratory examination: Secondary | ICD-10-CM | POA: Insufficient documentation

## 2012-05-15 DIAGNOSIS — I10 Essential (primary) hypertension: Secondary | ICD-10-CM | POA: Insufficient documentation

## 2012-05-15 DIAGNOSIS — IMO0002 Reserved for concepts with insufficient information to code with codable children: Secondary | ICD-10-CM

## 2012-05-15 DIAGNOSIS — Z01812 Encounter for preprocedural laboratory examination: Secondary | ICD-10-CM | POA: Insufficient documentation

## 2012-05-15 DIAGNOSIS — M8448XA Pathological fracture, other site, initial encounter for fracture: Principal | ICD-10-CM | POA: Insufficient documentation

## 2012-05-15 HISTORY — PX: KYPHOPLASTY: SHX5884

## 2012-05-15 SURGERY — KYPHOPLASTY
Anesthesia: General | Site: Back | Wound class: Clean

## 2012-05-15 MED ORDER — PANTOPRAZOLE SODIUM 40 MG IV SOLR
40.0000 mg | Freq: Every day | INTRAVENOUS | Status: DC
  Administered 2012-05-15: 40 mg via INTRAVENOUS
  Filled 2012-05-15 (×2): qty 40

## 2012-05-15 MED ORDER — HYDROCODONE-ACETAMINOPHEN 5-325 MG PO TABS: 1.0000 | ORAL_TABLET | ORAL | Status: DC | PRN

## 2012-05-15 MED ORDER — HYDROMORPHONE HCL PF 1 MG/ML IJ SOLN: 0.2500 mg | INTRAMUSCULAR | Status: DC | PRN

## 2012-05-15 MED ORDER — ONDANSETRON HCL 4 MG/2ML IJ SOLN: 4.0000 mg | INTRAMUSCULAR | Status: DC | PRN

## 2012-05-15 MED ORDER — LIDOCAINE HCL (CARDIAC) 20 MG/ML IV SOLN
INTRAVENOUS | Status: DC | PRN
  Administered 2012-05-15: 100 mg via INTRAVENOUS

## 2012-05-15 MED ORDER — BUPIVACAINE-EPINEPHRINE 0.5% -1:200000 IJ SOLN
INTRAMUSCULAR | Status: DC | PRN
  Administered 2012-05-15: 3 mL
  Administered 2012-05-15: 4 mL

## 2012-05-15 MED ORDER — EPHEDRINE SULFATE 50 MG/ML IJ SOLN
INTRAMUSCULAR | Status: DC | PRN
  Administered 2012-05-15 (×3): 15 mg via INTRAVENOUS

## 2012-05-15 MED ORDER — DEXAMETHASONE SODIUM PHOSPHATE 4 MG/ML IJ SOLN
INTRAMUSCULAR | Status: DC | PRN
  Administered 2012-05-15: 4 mg via INTRAVENOUS

## 2012-05-15 MED ORDER — OXYCODONE HCL 5 MG/5ML PO SOLN: 5.0000 mg | Freq: Once | ORAL | Status: DC | PRN

## 2012-05-15 MED ORDER — BISOPROLOL-HYDROCHLOROTHIAZIDE 2.5-6.25 MG PO TABS: 1.0000 | ORAL_TABLET | Freq: Every day | ORAL | Status: DC

## 2012-05-15 MED ORDER — 0.9 % SODIUM CHLORIDE (POUR BTL) OPTIME
TOPICAL | Status: DC | PRN
  Administered 2012-05-15: 1000 mL

## 2012-05-15 MED ORDER — ONDANSETRON HCL 4 MG/2ML IJ SOLN: 4.0000 mg | Freq: Once | INTRAMUSCULAR | Status: DC | PRN

## 2012-05-15 MED ORDER — PHENOL 1.4 % MT LIQD: 1.0000 | OROMUCOSAL | Status: DC | PRN

## 2012-05-15 MED ORDER — MENTHOL 3 MG MT LOZG
1.0000 | LOZENGE | OROMUCOSAL | Status: DC | PRN
  Administered 2012-05-15: 3 mg via ORAL
  Filled 2012-05-15: qty 9

## 2012-05-15 MED ORDER — IOHEXOL 300 MG/ML  SOLN
INTRAMUSCULAR | Status: DC | PRN
  Administered 2012-05-15: 50 mL

## 2012-05-15 MED ORDER — SIMVASTATIN 10 MG PO TABS
10.0000 mg | ORAL_TABLET | Freq: Every day | ORAL | Status: DC
  Filled 2012-05-15 (×2): qty 1

## 2012-05-15 MED ORDER — NEOSTIGMINE METHYLSULFATE 1 MG/ML IJ SOLN
INTRAMUSCULAR | Status: DC | PRN
  Administered 2012-05-15: 3 mg via INTRAVENOUS

## 2012-05-15 MED ORDER — SERTRALINE HCL 50 MG PO TABS
50.0000 mg | ORAL_TABLET | Freq: Every morning | ORAL | Status: DC
  Filled 2012-05-15: qty 1

## 2012-05-15 MED ORDER — LACTATED RINGERS IV SOLN
INTRAVENOUS | Status: DC | PRN
  Administered 2012-05-15: 15:00:00 via INTRAVENOUS

## 2012-05-15 MED ORDER — ONDANSETRON HCL 4 MG/2ML IJ SOLN
INTRAMUSCULAR | Status: DC | PRN
  Administered 2012-05-15: 4 mg via INTRAVENOUS

## 2012-05-15 MED ORDER — MIDAZOLAM HCL 5 MG/5ML IJ SOLN
INTRAMUSCULAR | Status: DC | PRN
  Administered 2012-05-15: 2 mg via INTRAVENOUS

## 2012-05-15 MED ORDER — ARTIFICIAL TEARS OP OINT
TOPICAL_OINTMENT | OPHTHALMIC | Status: DC | PRN
  Administered 2012-05-15: 1 via OPHTHALMIC

## 2012-05-15 MED ORDER — PROPOFOL 10 MG/ML IV BOLUS
INTRAVENOUS | Status: DC | PRN
  Administered 2012-05-15: 400 mg via INTRAVENOUS

## 2012-05-15 MED ORDER — MORPHINE SULFATE 2 MG/ML IJ SOLN: 1.0000 mg | INTRAMUSCULAR | Status: DC | PRN

## 2012-05-15 MED ORDER — OXYCODONE HCL 5 MG PO TABS: 5.0000 mg | ORAL_TABLET | Freq: Once | ORAL | Status: DC | PRN

## 2012-05-15 MED ORDER — FENTANYL CITRATE 0.05 MG/ML IJ SOLN
INTRAMUSCULAR | Status: DC | PRN
  Administered 2012-05-15: 25 ug via INTRAVENOUS
  Administered 2012-05-15: 125 ug via INTRAVENOUS

## 2012-05-15 MED ORDER — CEFAZOLIN SODIUM-DEXTROSE 2-3 GM-% IV SOLR
2.0000 g | Freq: Three times a day (TID) | INTRAVENOUS | Status: AC
  Administered 2012-05-15 – 2012-05-16 (×2): 2 g via INTRAVENOUS
  Filled 2012-05-15 (×2): qty 50

## 2012-05-15 MED ORDER — SULFACETAMIDE SODIUM 10 % OP OINT: 1.0000 "application " | TOPICAL_OINTMENT | Freq: Three times a day (TID) | OPHTHALMIC | Status: DC

## 2012-05-15 MED ORDER — MEPERIDINE HCL 25 MG/ML IJ SOLN: 6.2500 mg | INTRAMUSCULAR | Status: DC | PRN

## 2012-05-15 MED ORDER — OXYCODONE-ACETAMINOPHEN 5-325 MG PO TABS: 1.0000 | ORAL_TABLET | ORAL | Status: DC | PRN

## 2012-05-15 MED ORDER — DIAZEPAM 2 MG PO TABS: 2.0000 mg | ORAL_TABLET | Freq: Four times a day (QID) | ORAL | Status: DC | PRN

## 2012-05-15 MED ORDER — DOCUSATE SODIUM 100 MG PO CAPS
100.0000 mg | ORAL_CAPSULE | Freq: Two times a day (BID) | ORAL | Status: DC
  Administered 2012-05-15: 100 mg via ORAL
  Filled 2012-05-15: qty 1

## 2012-05-15 MED ORDER — ROCURONIUM BROMIDE 100 MG/10ML IV SOLN
INTRAVENOUS | Status: DC | PRN
  Administered 2012-05-15: 35 mg via INTRAVENOUS

## 2012-05-15 MED ORDER — ACETAMINOPHEN 325 MG PO TABS: 650.0000 mg | ORAL_TABLET | ORAL | Status: DC | PRN

## 2012-05-15 MED ORDER — ACETAMINOPHEN 650 MG RE SUPP: 650.0000 mg | RECTAL | Status: DC | PRN

## 2012-05-15 MED ORDER — LACTATED RINGERS IV SOLN: INTRAVENOUS | Status: DC

## 2012-05-15 MED ORDER — GLYCOPYRROLATE 0.2 MG/ML IJ SOLN
INTRAMUSCULAR | Status: DC | PRN
  Administered 2012-05-15: 0.4 mg via INTRAVENOUS

## 2012-05-15 SURGICAL SUPPLY — 43 items
APL SKNCLS STERI-STRIP NONHPOA (GAUZE/BANDAGES/DRESSINGS) ×1
BENZOIN TINCTURE PRP APPL 2/3 (GAUZE/BANDAGES/DRESSINGS) ×2 IMPLANT
BLADE SURG ROTATE 9660 (MISCELLANEOUS) IMPLANT
CEMENT BONE KYPHX HV R (Orthopedic Implant) ×1 IMPLANT
CLOTH BEACON ORANGE TIMEOUT ST (SAFETY) ×2 IMPLANT
CONT SPEC 4OZ CLIKSEAL STRL BL (MISCELLANEOUS) ×3 IMPLANT
DEVICE BIOPSY BONE KYPHX (INSTRUMENTS) ×2 IMPLANT
DRAPE C-ARM 42X72 X-RAY (DRAPES) ×2 IMPLANT
DRAPE INCISE IOBAN 66X45 STRL (DRAPES) ×2 IMPLANT
DRAPE LAPAROTOMY 100X72X124 (DRAPES) ×2 IMPLANT
DRAPE PROXIMA HALF (DRAPES) ×2 IMPLANT
DRAPE SURG 17X23 STRL (DRAPES) ×8 IMPLANT
DRESSING TELFA 8X3 (GAUZE/BANDAGES/DRESSINGS) ×1 IMPLANT
GLOVE BIO SURGEON STRL SZ 6.5 (GLOVE) ×1 IMPLANT
GLOVE BIO SURGEON STRL SZ8.5 (GLOVE) ×2 IMPLANT
GLOVE BIOGEL PI IND STRL 7.0 (GLOVE) ×1 IMPLANT
GLOVE BIOGEL PI IND STRL 7.5 (GLOVE) IMPLANT
GLOVE BIOGEL PI INDICATOR 7.0 (GLOVE) ×1
GLOVE BIOGEL PI INDICATOR 7.5 (GLOVE) ×1
GLOVE ECLIPSE 7.5 STRL STRAW (GLOVE) ×1 IMPLANT
GLOVE EXAM NITRILE LRG STRL (GLOVE) IMPLANT
GLOVE EXAM NITRILE MD LF STRL (GLOVE) ×1 IMPLANT
GLOVE EXAM NITRILE XL STR (GLOVE) IMPLANT
GLOVE EXAM NITRILE XS STR PU (GLOVE) IMPLANT
GLOVE SS BIOGEL STRL SZ 8 (GLOVE) ×1 IMPLANT
GLOVE SUPERSENSE BIOGEL SZ 8 (GLOVE) ×1
GOWN BRE IMP SLV AUR LG STRL (GOWN DISPOSABLE) ×2 IMPLANT
GOWN BRE IMP SLV AUR XL STRL (GOWN DISPOSABLE) IMPLANT
KIT BASIN OR (CUSTOM PROCEDURE TRAY) ×2 IMPLANT
KIT ROOM TURNOVER OR (KITS) ×2 IMPLANT
NS IRRIG 1000ML POUR BTL (IV SOLUTION) ×2 IMPLANT
PACK EENT II TURBAN DRAPE (CUSTOM PROCEDURE TRAY) ×2 IMPLANT
PAD ARMBOARD 7.5X6 YLW CONV (MISCELLANEOUS) ×6 IMPLANT
SPECIMEN JAR SMALL (MISCELLANEOUS) IMPLANT
SPONGE GAUZE 4X4 12PLY (GAUZE/BANDAGES/DRESSINGS) ×2 IMPLANT
STRIP CLOSURE SKIN 1/2X4 (GAUZE/BANDAGES/DRESSINGS) ×2 IMPLANT
SUT VIC AB 3-0 SH 8-18 (SUTURE) ×2 IMPLANT
SYR CONTROL 10ML LL (SYRINGE) ×2 IMPLANT
SYSTEM BONE CEMENT MIXING (KITS) ×1 IMPLANT
TAPE CLOTH SURG 4X10 WHT LF (GAUZE/BANDAGES/DRESSINGS) ×1 IMPLANT
TOWEL OR 17X24 6PK STRL BLUE (TOWEL DISPOSABLE) ×2 IMPLANT
TOWEL OR 17X26 10 PK STRL BLUE (TOWEL DISPOSABLE) ×2 IMPLANT
TRAY KYPHOPAK 15/3 ONESTEP 1ST (MISCELLANEOUS) ×2 IMPLANT

## 2012-05-15 NOTE — Progress Notes (Signed)
Subjective:  The patient is alert and pleasant. She looks well. She is in no apparent distress.  Objective: Vital signs in last 24 hours: Temp:  [97.7 F (36.5 C)-98 F (36.7 C)] 98 F (36.7 C) (01/23 1632) Pulse Rate:  [71] 71  (01/23 0952) Resp:  [18] 18  (01/23 0952) BP: (122)/(77) 122/77 mmHg (01/23 0952) SpO2:  [96 %] 96 % (01/23 0952)  Intake/Output from previous day:   Intake/Output this shift: Total I/O In: 700 [I.V.:700] Out: -   Physical exam the patient is alert and pleasant. She is moving her lower extremities well.  Lab Results: No results found for this basename: WBC:2,HGB:2,HCT:2,PLT:2 in the last 72 hours BMET No results found for this basename: NA:2,K:2,CL:2,CO2:2,GLUCOSE:2,BUN:2,CREATININE:2,CALCIUM:2 in the last 72 hours  Studies/Results: No results found.  Assessment/Plan: The patient is doing well.  LOS: 0 days     Rishaan Gunner D 05/15/2012, 4:35 PM

## 2012-05-15 NOTE — Preoperative (Signed)
Beta Blockers   Reason not to administer Beta Blockers:Not Applicable 

## 2012-05-15 NOTE — Transfer of Care (Signed)
Immediate Anesthesia Transfer of Care Note  Patient: Katherine Rubio  Procedure(s) Performed: Procedure(s) (LRB) with comments: KYPHOPLASTY (N/A) - Thoracic Six Kyphoplasty  Patient Location: PACU  Anesthesia Type:General  Level of Consciousness: awake, alert  and oriented  Airway & Oxygen Therapy: Patient Spontanous Breathing and Patient connected to nasal cannula oxygen  Post-op Assessment: Report given to PACU RN, Post -op Vital signs reviewed and stable and Patient moving all extremities  Post vital signs: Reviewed and stable  Complications: No apparent anesthesia complications

## 2012-05-15 NOTE — H&P (Signed)
Subjective: The patient is to 79 year old white female who has suffered from back pain. She has failed medical management and was worked up with a thoracic MRI and x-rays. This demonstrated an acute T6 compression fracture with some spinal stenosis. I discussed the situation with the patient and her family. We have discussed the various treatment options including at T6 kyphoplasty. The patient has weighed the risks, benefits, and alternatives surgery and decided proceed with at T6 kyphoplasty.   Past Medical History  Diagnosis Date  . Depression   . Hyperlipidemia   . Hypertension   . Chronic low back pain   . PONV (postoperative nausea and vomiting)   . GERD (gastroesophageal reflux disease)   . Osteoarthritis     Past Surgical History  Procedure Date  . Back surgery   . Knee surgery     left knee replacement  . Eye surgery     BIL CATARACT    No Known Allergies  History  Substance Use Topics  . Smoking status: Never Smoker   . Smokeless tobacco: Never Used  . Alcohol Use: No    Family History  Problem Relation Age of Onset  . Lung cancer Brother     deceased  . Stroke Father     deceased age 43  . Pulmonary fibrosis Son     deceased  . Osteoarthritis Brother    Prior to Admission medications   Medication Sig Start Date End Date Taking? Authorizing Provider  bisoprolol-hydrochlorothiazide (ZIAC) 2.5-6.25 MG per tablet Take 1 tablet by mouth daily.   Yes Historical Provider, MD  HYDROcodone-acetaminophen (NORCO/VICODIN) 5-325 MG per tablet Take 1-1.5 tablets by mouth every 8 (eight) hours as needed. For pain. 05/07/12  Yes Edwyna Perfect, MD  pravastatin (PRAVACHOL) 20 MG tablet Take 1 tablet (20 mg total) by mouth at bedtime. 09/03/11  Yes Edwyna Perfect, MD  Ranitidine HCl (ZANTAC PO) Take 1 tablet by mouth daily as needed. For heartburn.   Yes Historical Provider, MD  sertraline (ZOLOFT) 50 MG tablet Take 50 mg by mouth every morning.   Yes Historical Provider, MD    sulfacetamide (BLEPH-10) 10 % ophthalmic ointment Place 1 application into the right eye 3 (three) times daily. 1/2 inch ribbon to right eye lids three times a day 05/02/12  Yes Edwyna Perfect, MD     Review of Systems  Positive ROS: As above  All other systems have been reviewed and were otherwise negative with the exception of those mentioned in the HPI and as above.  Objective: Vital signs in last 24 hours: Temp:  [97.7 F (36.5 C)] 97.7 F (36.5 C) (01/23 0952) Pulse Rate:  [71] 71  (01/23 0952) Resp:  [18] 18  (01/23 0952) BP: (122)/(77) 122/77 mmHg (01/23 0952) SpO2:  [96 %] 96 % (01/23 0952)  General Appearance: Alert, cooperative, no distress, appears stated age Head: Normocephalic, without obvious abnormality, atraumatic Eyes: PERRL, conjunctiva/corneas clear, EOM's intact, fundi benign, both eyes      Ears: Normal TM's and external ear canals, both ears Throat: Lips, mucosa, and tongue normal; teeth and gums normal Neck: Supple, symmetrical, trachea midline, no adenopathy; thyroid: No enlargement/tenderness/nodules; no carotid bruit or JVD Back: Symmetric, no curvature, ROM normal, no CVA tenderness. The patient has tenderness in the midthoracic region at at approximately T6 Lungs: Clear to auscultation bilaterally, respirations unlabored Heart: Regular rate and rhythm, S1 and S2 normal, no murmur, rub or gallop Abdomen: Soft, non-tender, bowel sounds active all four quadrants,  no masses, no organomegaly Extremities: Extremities normal, atraumatic, no cyanosis or edema Pulses: 2+ and symmetric all extremities Skin: Skin color, texture, turgor normal, no rashes or lesions  NEUROLOGIC:   Mental status: alert and oriented, no aphasia, good attention span, Fund of knowledge/ memory ok Motor Exam - grossly normal Sensory Exam - grossly normal Reflexes:  Coordination - grossly normal Gait - grossly normal Balance - grossly normal Cranial Nerves: I: smell Not tested   II: visual acuity  OS: Normal    OD: Normal   II: visual fields Full to confrontation  II: pupils Equal, round, reactive to light  III,VII: ptosis None  III,IV,VI: extraocular muscles  Full ROM  V: mastication Normal  V: facial light touch sensation  Normal  V,VII: corneal reflex  Present  VII: facial muscle function - upper  Normal  VII: facial muscle function - lower Normal  VIII: hearing Not tested  IX: soft palate elevation  Normal  IX,X: gag reflex Present  XI: trapezius strength  5/5  XI: sternocleidomastoid strength 5/5  XI: neck flexion strength  5/5  XII: tongue strength  Normal    Data Review Lab Results  Component Value Date   WBC 7.3 05/08/2012   HGB 12.6 05/08/2012   HCT 36.8 05/08/2012   MCV 94.1 05/08/2012   PLT 258 05/08/2012   Lab Results  Component Value Date   NA 140 05/08/2012   K 3.9 05/08/2012   CL 105 05/08/2012   CO2 24 05/08/2012   BUN 27* 05/08/2012   CREATININE 0.77 05/08/2012   GLUCOSE 90 05/08/2012   Lab Results  Component Value Date   INR 1.00 01/02/2011    Assessment/Plan: T6 compression fracture, thoracic pain: I have discussed the situation with the patient. I reviewed her x-ray and CAT scan with her. I've pointed out the abnormalities. We have discussed the various treatment option including a T6 kyphoplasty. I have described the surgery to her. I've shown her surgical models. We have discussed the risks, benefits, alternatives, and likelihood of achieving our goals with surgery. I have answered all the patient's questions. She has decided to proceed with surgery.   Sherlene Rickel D 05/15/2012 2:50 PM

## 2012-05-15 NOTE — Op Note (Signed)
Brief history: The patient is 79 year old white female who has complained of acute midthoracic pain. She has failed medical management and was worked up with a thoracic x-ray and a thoracic MRI which demonstrated a T6 compression fracture. I discussed the various treatment options with the patient and her family including surgery. The patient has weighed the risks, benefits, and alternatives surgery and decided proceed with a T6 kyphoplasty.  Preop diagnosis: T6 compression fracture, thoracic pain  Postop diagnosis: The same  Procedure: T6 kyphoplasty but the vertebral body biopsy  Surgeon: Dr. Delma Officer  Assistant: None  Anesthesia: Gen. endotracheal  Estimated blood loss: Minimal  Specimens: T6 vertebral body biopsy  Drains: None  Complications: None  Description of procedure: The patient was brought to the operating room by the anesthesia team. General endotracheal anesthesia was induced. The patient was turned to the prone position on the chest rolls. Her thoracic region was then prepared with DuraPrep. Sterile drapes were applied. I then injected the area to be incised with Marcaine with epinephrine solution. I made small incisions over the bilateral T6 pedicles. Under fluoroscopic guidance I cannulated the bilateral T6 pedicles with the needle. I removed the cannula and then obtained a vertebral by biopsy at T6 on the left. I used a drill, then removed the drill and inserted the balloon was reinflated under fluoroscopic guidance. I then deflated the balloon. I then inserted the bone cement the at the cannulas under biplanar fluoroscopic guidance. I did not note any untoward effects. After were satisfied with the placement of the cement. I removed the cannulas. I then reapproximated patient's subcutaneous tissue with interrupted 3-0 Vicryl suture. We reapproximated the skin with Steri-Strips and benzoin. The wounds were then coated with bacitracin ointment. A sterile dressing was  applied. The drapes were removed. The patient was subsequently returned to the supine position. She was then extubated by the anesthesia team. She was transported to the post anesthesia care unit in stable condition. By report all sponge instrument and needle counts were correct at the end of this case.

## 2012-05-15 NOTE — Anesthesia Postprocedure Evaluation (Signed)
  Anesthesia Post-op Note  Patient: Katherine Rubio  Procedure(s) Performed: Procedure(s) (LRB) with comments: KYPHOPLASTY (N/A) - Thoracic Six Kyphoplasty  Patient Location: PACU  Anesthesia Type:General  Level of Consciousness: awake, alert , oriented and patient cooperative  Airway and Oxygen Therapy: Patient Spontanous Breathing  Post-op Pain: none  Post-op Assessment: Post-op Vital signs reviewed, Patient's Cardiovascular Status Stable, Respiratory Function Stable, Patent Airway, No signs of Nausea or vomiting and Pain level controlled  Post-op Vital Signs: Reviewed and stable  Complications: No apparent anesthesia complications

## 2012-05-15 NOTE — Anesthesia Preprocedure Evaluation (Signed)
Anesthesia Evaluation  Patient identified by MRN, date of birth, ID band Patient awake    Reviewed: Allergy & Precautions, H&P , NPO status , Patient's Chart, lab work & pertinent test results  History of Anesthesia Complications (+) PONV  Airway Mallampati: I TM Distance: >3 FB Neck ROM: Full    Dental   Pulmonary          Cardiovascular hypertension, Pt. on medications     Neuro/Psych    GI/Hepatic   Endo/Other    Renal/GU      Musculoskeletal   Abdominal   Peds  Hematology   Anesthesia Other Findings   Reproductive/Obstetrics                           Anesthesia Physical Anesthesia Plan  ASA: II  Anesthesia Plan: General   Post-op Pain Management:    Induction: Intravenous  Airway Management Planned: Oral ETT  Additional Equipment:   Intra-op Plan:   Post-operative Plan: Extubation in OR  Informed Consent: I have reviewed the patients History and Physical, chart, labs and discussed the procedure including the risks, benefits and alternatives for the proposed anesthesia with the patient or authorized representative who has indicated his/her understanding and acceptance.     Plan Discussed with: CRNA and Surgeon  Anesthesia Plan Comments:         Anesthesia Quick Evaluation

## 2012-05-16 ENCOUNTER — Encounter (HOSPITAL_COMMUNITY): Payer: Self-pay | Admitting: Neurosurgery

## 2012-05-16 MED ORDER — OXYCODONE-ACETAMINOPHEN 5-325 MG PO TABS: 1.0000 | ORAL_TABLET | ORAL | Status: DC | PRN

## 2012-05-16 NOTE — Progress Notes (Signed)
Pt and caregiver given D/C instructions with Rx., both verbalized understanding. Pt D/C'd home via wheelchair @ 0840 per MD order. Rema Fendt, RN

## 2012-05-16 NOTE — Discharge Summary (Signed)
  Physician Discharge Summary  Patient ID: Katherine Rubio MRN: [id number] DOB/AGE: [id number] 77 y.o.  Admit date: 05/15/2012 Discharge date: 05/16/2012  Admission Diagnoses: Acute T6 compression fracture  Discharge Diagnoses: The same Active Problems:  * No active hospital problems. *    Discharged Condition: good  Hospital Course: I admitted the patient to [facility] on 05/15/2012. On that day I performed a T6 kyphoplasty. The surgery went well.  The patient's postoperative course was unremarkable and on postop day #1 she requested discharge to home. The patient was given oral and written discharge instructions. All her questions were answered.  Consults: None Significant Diagnostic Studies: None Treatments: T6 kyphoplasty Discharge Exam: Blood pressure 104/59, pulse 90, temperature 99.1 F (37.3 C), temperature source Oral, resp. rate 18, SpO2 95.00%. The patient is alert and oriented. Her lower extremity strength is grossly normal. Her dressing is clean and dry.  Disposition: Home  Discharge Orders    Future Appointments: Provider: Department: Dept Phone: Center:   08/04/2012 11:00 AM Edwyna Perfect, MD [facility] at  [facility] [phone] [facility]     Future Orders Please Complete By Expires   Diet - low sodium heart healthy      Increase activity slowly      Discharge instructions      Comments:   Call [phone] for followup appointment.   Remove dressing in 48 hours      Call MD for:  temperature >100.4      Call MD for:  persistant nausea and vomiting      Call MD for:  severe uncontrolled pain      Call MD for:  redness, tenderness, or signs of infection (pain, swelling, redness, odor or green/yellow discharge around incision site)      Call MD for:  difficulty breathing, headache or visual disturbances      Call MD for:  hives      Call MD for:  persistant dizziness or light-headedness      Call MD for:  extreme fatigue          Medication List     As of 05/16/2012  7:52 AM    STOP taking these medications         HYDROcodone-acetaminophen 5-325 MG per tablet   Commonly known as: NORCO/VICODIN      TAKE these medications         bisoprolol-hydrochlorothiazide 2.5-6.25 MG per tablet   Commonly known as: ZIAC   Take 1 tablet by mouth daily.      oxyCODONE-acetaminophen 5-325 MG per tablet   Commonly known as: PERCOCET/ROXICET   Take 1-2 tablets by mouth every 4 (four) hours as needed.      pravastatin 20 MG tablet   Commonly known as: PRAVACHOL   Take 1 tablet (20 mg total) by mouth at bedtime.      sertraline 50 MG tablet   Commonly known as: ZOLOFT   Take 50 mg by mouth every morning.      sulfacetamide 10 % ophthalmic ointment   Commonly known as: BLEPH-10   Place 1 application into the right eye 3 (three) times daily. 1/2 inch ribbon to right eye lids three times a day      ZANTAC PO   Take 1 tablet by mouth daily as needed. For heartburn.         SignedCristi Loron 05/16/2012, 7:52 AM

## 2012-05-27 ENCOUNTER — Other Ambulatory Visit: Payer: Self-pay | Admitting: Internal Medicine

## 2012-06-20 ENCOUNTER — Encounter: Payer: Self-pay | Admitting: Family

## 2012-06-20 ENCOUNTER — Telehealth: Payer: Self-pay | Admitting: Internal Medicine

## 2012-06-20 ENCOUNTER — Ambulatory Visit (INDEPENDENT_AMBULATORY_CARE_PROVIDER_SITE_OTHER): Payer: Medicare Other | Admitting: Family

## 2012-06-20 VITALS — BP 120/74 | HR 76 | Temp 97.9°F | Resp 18 | Wt 184.1 lb

## 2012-06-20 DIAGNOSIS — E538 Deficiency of other specified B group vitamins: Secondary | ICD-10-CM

## 2012-06-20 DIAGNOSIS — N390 Urinary tract infection, site not specified: Secondary | ICD-10-CM | POA: Insufficient documentation

## 2012-06-20 DIAGNOSIS — M25569 Pain in unspecified knee: Secondary | ICD-10-CM

## 2012-06-20 DIAGNOSIS — R4182 Altered mental status, unspecified: Secondary | ICD-10-CM

## 2012-06-20 DIAGNOSIS — R413 Other amnesia: Secondary | ICD-10-CM

## 2012-06-20 LAB — POCT URINALYSIS DIPSTICK
Glucose, UA: NEGATIVE
Ketones, UA: NEGATIVE
Nitrite, UA: NEGATIVE

## 2012-06-20 LAB — RPR

## 2012-06-20 LAB — TSH: TSH: 3.738 u[IU]/mL (ref 0.350–4.500)

## 2012-06-20 MED ORDER — CIPROFLOXACIN HCL 250 MG PO TABS: 250.0000 mg | ORAL_TABLET | Freq: Two times a day (BID) | ORAL | Status: DC

## 2012-06-20 NOTE — Telephone Encounter (Addendum)
Noted  

## 2012-06-20 NOTE — Patient Instructions (Addendum)
Please complete your  Lab work prior to leaving. Follow up in 3 months.

## 2012-06-20 NOTE — Assessment & Plan Note (Signed)
Recommended tylenol rather than bufferin prn.  Pt verbalizes understanding.

## 2012-06-20 NOTE — Progress Notes (Signed)
Subjective:    Patient ID: Katherine Rubio, female    DOB: [birth year], 79 y.o.   MRN: [id number]  HPI  Katherine Rubio is an 79 yr old female who presents today for follow up.  She reports that she is not sure why she is here.  Nephew, who is not currently in the room with her, reports that pt has had increased confusion recently. (see phone note)  Apparently has also not been sleeping well at night and has been seeing insects nightly x 1 week. "nobody else sees them."  She reports that the exterminator has been out to her house and did not see any insects.    Reports + arthritis pain.  Worsened by cold weather.  Reports that she has been using bufferin prn.     Review of Systems Denies dysuria.   Denies anxiety/nervousness Denies problems with insomnia.  Reports that she is sleeping well.  She lives alone.     Past Medical History  Diagnosis Date  . Depression   . Hyperlipidemia   . Hypertension   . Chronic low back pain   . PONV (postoperative nausea and vomiting)   . GERD (gastroesophageal reflux disease)   . Osteoarthritis     History   Social History  . Marital Status: Widowed    Spouse Name: N/A    Number of Children: N/A  . Years of Education: N/A   Occupational History  . Not on file.   Social History Main Topics  . Smoking status: Never Smoker   . Smokeless tobacco: Never Used  . Alcohol Use: No  . Drug Use: No  . Sexually Active: Not on file   Other Topics Concern  . Not on file   Social History Narrative   Widowed   Retired as a Scientist, physiological.      Supportive daughter   Never Smoked   Alcohol use-no     Past Surgical History  Procedure Laterality Date  . Back surgery    . Knee surgery      left knee replacement  . Eye surgery      BIL CATARACT  . Kyphoplasty  05/15/2012    Procedure: KYPHOPLASTY;  Surgeon: Cristi Loron, MD;  Location: [facility];  Service: Neurosurgery;  Laterality: N/A;  Thoracic Six Kyphoplasty    Family History   Problem Relation Age of Onset  . Lung cancer Brother     deceased  . Stroke Father     deceased age 75  . Pulmonary fibrosis Son     deceased  . Osteoarthritis Brother     No Known Allergies  Current Outpatient Prescriptions on File Prior to Visit  Medication Sig Dispense Refill  . bisoprolol-hydrochlorothiazide (ZIAC) 2.5-6.25 MG per tablet Take 1 tablet by mouth daily.      Marland Kitchen oxyCODONE-acetaminophen (PERCOCET/ROXICET) 5-325 MG per tablet Take 1-2 tablets by mouth every 4 (four) hours as needed.  100 tablet  0  . pravastatin (PRAVACHOL) 20 MG tablet Take 1 tablet (20 mg total) by mouth at bedtime.  90 tablet  1  . Ranitidine HCl (ZANTAC PO) Take 1 tablet by mouth daily as needed. For heartburn.      . sertraline (ZOLOFT) 50 MG tablet TAKE 1 TABLET (50 MG TOTAL) BY MOUTH DAILY.  90 tablet  1  . sulfacetamide (BLEPH-10) 10 % ophthalmic ointment Place 1 application into the right eye 3 (three) times daily. 1/2 inch ribbon to right eye lids three times a day  No current facility-administered medications on file prior to visit.    BP 120/74  Pulse 76  Temp(Src) 97.9 F (36.6 C) (Oral)  Resp 18  Wt 184 lb 1.9 oz (83.516 kg)  BMI 33.67 kg/m2  SpO2 98%       Objective:   Physical Exam  Constitutional: She is oriented to person, place, and time. She appears well-developed and well-nourished. No distress.  Cardiovascular: Normal rate and regular rhythm.   No murmur heard. Pulmonary/Chest: Effort normal and breath sounds normal. No respiratory distress. She has no wheezes. She has no rales. She exhibits no tenderness.  Musculoskeletal: She exhibits no edema.  Neurological: She is alert and oriented to person, place, and time.  Psychiatric: She has a normal mood and affect. Her behavior is normal. Judgment and thought content normal.          Assessment & Plan:

## 2012-06-20 NOTE — Telephone Encounter (Signed)
Nephew who has her poa is concerned as she is not sleeping because she wakes up in the middle of the night with bugs flying around and she is afraid they will get on her.  He has had the exterminator there and they see no evidence of the bugs.  She is acting more forgetful than normal.  He is afraid she may have a UTI or she needs something to help her sleep

## 2012-06-20 NOTE — Assessment & Plan Note (Signed)
UA is suggestive of UTI. Will rx with cipro.  Send urine for culture.  May be contributing to her confusion?  A MMSE was performed in the office today and she scored in normal range 28/30.  Check TSH, RPR.  I wanted to order b12 and folate, but medicare would not cover with diagnosis of confusion.  Note is made of some low normal b12 levels in the past.

## 2012-06-21 LAB — URINE CULTURE: Colony Count: 15000

## 2012-06-23 ENCOUNTER — Telehealth: Payer: Self-pay | Admitting: *Deleted

## 2012-06-23 NOTE — Telephone Encounter (Signed)
Message copied by Kathi Simpers on Mon Jun 23, 2012  2:44 PM ------      Message from: O'SULLIVAN, MELISSA      Created: Sun Jun 22, 2012  9:38 PM       Pls call pt and let her know that urine culture is neg. She can stop cipro after 3 days.  Thyroid function normal, syphilis screen neg. ------

## 2012-06-23 NOTE — Telephone Encounter (Signed)
Left message to return my call on home #.  Notes Recorded by Sandford Craze, NP on 06/22/2012 at 9:38 PM Pls call pt and let her know that urine culture is neg. She can stop cipro after 3 days. Thyroid function normal, syphilis screen neg.

## 2012-06-24 NOTE — Telephone Encounter (Signed)
Patients nephew Alexis Frock returned phone call. Best # [phone]. Anytime after 3pm.

## 2012-06-24 NOTE — Telephone Encounter (Signed)
Left detailed message on home# and to call if any questions. 

## 2012-07-17 ENCOUNTER — Telehealth: Payer: Self-pay | Admitting: Family

## 2012-07-17 MED ORDER — ALENDRONATE SODIUM 70 MG PO TABS: 70.0000 mg | ORAL_TABLET | ORAL | Status: DC

## 2012-07-17 NOTE — Telephone Encounter (Signed)
Left detailed message on home# and to call if any questions. 

## 2012-07-17 NOTE — Telephone Encounter (Signed)
Pls call pt's nephew steve and let him know that I have reviewed the notes from Dr. Lovell Sheehan.  Due to her compression fracture and osteopenia, I would like for her to add fosamax once weekly and caltrated 600mg  + D bid.   She should sit upright x 90 minutes after taking fosamax.

## 2012-07-25 ENCOUNTER — Telehealth: Payer: Self-pay | Admitting: Family

## 2012-07-25 DIAGNOSIS — H9193 Unspecified hearing loss, bilateral: Secondary | ICD-10-CM

## 2012-07-25 NOTE — Telephone Encounter (Signed)
Pls ask audiology to send me their report and I will review and provide prescription.

## 2012-07-25 NOTE — Telephone Encounter (Signed)
Patients nephew states that patient has an appointment with [facility]. [facility] told Katherine Rubio that they would like an order for hearing aids from [place] so medicare would pay for the hearing aids

## 2012-07-28 NOTE — Telephone Encounter (Signed)
Spoke with [facility]; information given from nephew was incorrect, [facility] is needing referral for Hearing Test [last 2010]. Information forwarded to provider; Ok to enter referral/SLS

## 2012-08-04 ENCOUNTER — Ambulatory Visit: Payer: Medicare Other | Admitting: Internal Medicine

## 2012-08-07 ENCOUNTER — Other Ambulatory Visit: Payer: Self-pay | Admitting: Internal Medicine

## 2012-08-07 NOTE — Telephone Encounter (Signed)
Rx request to pharmacy/SLS  

## 2012-09-17 ENCOUNTER — Ambulatory Visit (INDEPENDENT_AMBULATORY_CARE_PROVIDER_SITE_OTHER): Payer: Medicare Other | Admitting: Family

## 2012-09-17 ENCOUNTER — Encounter: Payer: Self-pay | Admitting: Family

## 2012-09-17 VITALS — BP 100/70 | HR 68 | Temp 97.8°F | Resp 20 | Wt 185.1 lb

## 2012-09-17 DIAGNOSIS — I10 Essential (primary) hypertension: Secondary | ICD-10-CM

## 2012-09-17 DIAGNOSIS — E785 Hyperlipidemia, unspecified: Secondary | ICD-10-CM

## 2012-09-17 DIAGNOSIS — M545 Low back pain: Secondary | ICD-10-CM

## 2012-09-17 DIAGNOSIS — F329 Major depressive disorder, single episode, unspecified: Secondary | ICD-10-CM

## 2012-09-17 NOTE — Assessment & Plan Note (Signed)
Stable on zoloft continue same.  

## 2012-09-17 NOTE — Assessment & Plan Note (Addendum)
BP Readings from Last 3 Encounters:  09/17/12 100/70  06/20/12 120/74  05/16/12 104/59   Appears overtreated. Will cut losartan 25mg  in half, continue ziac.  Follow up in 1 month for BP check and bmet.

## 2012-09-17 NOTE — Assessment & Plan Note (Signed)
LDL at goal on pravastatin, continue same. Plan lft next visit.

## 2012-09-17 NOTE — Assessment & Plan Note (Signed)
This continues to be an issue for her.  She is requesting something for pain.  I recommended that she continue tylenol prn.

## 2012-09-17 NOTE — Progress Notes (Signed)
Subjective:    Patient ID: Katherine Rubio, female    DOB: Oct 24, 1922, 79 y.o.   MRN: [id number]  HPI  Katherine Rubio is an 79 yr old female who presents today for follow up.   1) HTN- She is currently maintained on losartan and bisoprolol- hctz.  Denies CP, SOB or swelling.   2) Hyperlipidemia- Last LDL was 94 (12/14) she is currently maintained on pravastatin.    3) Depression-  She is currently maintained on zoloft. Reports memory has been good. Reports mood has been "so so."  Denis sadness.    4) OA-  Reports that her arthritis bothers her most in the back.      Review of Systems See HPI  Past Medical History  Diagnosis Date  . Depression   . Hyperlipidemia   . Hypertension   . Chronic low back pain   . PONV (postoperative nausea and vomiting)   . GERD (gastroesophageal reflux disease)   . Osteoarthritis     History   Social History  . Marital Status: Widowed    Spouse Name: N/A    Number of Children: N/A  . Years of Education: N/A   Occupational History  . Not on file.   Social History Main Topics  . Smoking status: Never Smoker   . Smokeless tobacco: Never Used  . Alcohol Use: No  . Drug Use: No  . Sexually Active: Not on file   Other Topics Concern  . Not on file   Social History Narrative   Widowed   Retired as a Scientist, physiological.      Supportive daughter   Never Smoked   Alcohol use-no     Past Surgical History  Procedure Laterality Date  . Back surgery    . Knee surgery      left knee replacement  . Eye surgery      BIL CATARACT  . Kyphoplasty  05/15/2012    Procedure: KYPHOPLASTY;  Surgeon: Cristi Loron, MD;  Location: [facility];  Service: Neurosurgery;  Laterality: N/A;  Thoracic Six Kyphoplasty    Family History  Problem Relation Age of Onset  . Lung cancer Brother     deceased  . Stroke Father     deceased age 29  . Pulmonary fibrosis Son     deceased  . Osteoarthritis Brother     No Known Allergies  Current  Outpatient Prescriptions on File Prior to Visit  Medication Sig Dispense Refill  . alendronate (FOSAMAX) 70 MG tablet Take 1 tablet (70 mg total) by mouth every 7 (seven) days. Take with a full glass of water on an empty stomach.  4 tablet  11  . bisoprolol-hydrochlorothiazide (ZIAC) 2.5-6.25 MG per tablet Take 1 tablet by mouth daily.      . bisoprolol-hydrochlorothiazide (ZIAC) 2.5-6.25 MG per tablet TAKE 1 TABLET BY MOUTH DAILY  90 tablet  1  . Calcium Carbonate-Vitamin D (CALTRATE 600+D) 600-400 MG-UNIT per tablet Take 1 tablet by mouth 2 (two) times daily.      Marland Kitchen losartan (COZAAR) 25 MG tablet Take 25 mg by mouth daily.      . pravastatin (PRAVACHOL) 20 MG tablet Take 1 tablet (20 mg total) by mouth at bedtime.  90 tablet  1  . Ranitidine HCl (ZANTAC PO) Take 1 tablet by mouth daily as needed. For heartburn.      . sertraline (ZOLOFT) 50 MG tablet TAKE 1 TABLET (50 MG TOTAL) BY MOUTH DAILY.  90 tablet  1  No current facility-administered medications on file prior to visit.    BP 100/70  Pulse 68  Temp(Src) 97.8 F (36.6 C) (Oral)  Resp 20  Wt 185 lb 1.9 oz (83.97 kg)  BMI 33.85 kg/m2  SpO2 97%       Objective:   Physical Exam  Constitutional: She is oriented to person, place, and time. She appears well-developed and well-nourished. No distress.  HENT:  Head: Normocephalic and atraumatic.  Cardiovascular: Normal rate and regular rhythm.   No murmur heard. Pulmonary/Chest: Effort normal and breath sounds normal. No respiratory distress. She has no wheezes. She has no rales. She exhibits no tenderness.  Neurological: She is alert and oriented to person, place, and time.  Psychiatric: She has a normal mood and affect. Her behavior is normal. Judgment and thought content normal.          Assessment & Plan:

## 2012-09-17 NOTE — Patient Instructions (Addendum)
Cut losartan 25mg  in half and take 1/2 tab (12.5mg ) once daily.  Follow up in 1 month.

## 2012-09-18 ENCOUNTER — Telehealth: Payer: Self-pay | Admitting: Family

## 2012-09-18 NOTE — Telephone Encounter (Signed)
Patients nephew Alexis Frock called wanting to know the final outcome of patients visit yesterday. He says that she has an upset stomach today.

## 2012-09-18 NOTE — Telephone Encounter (Signed)
Blood pressure appeared overtreated and we cut back her losartan pill in half.  Otherwise, she continues to have arthritis pain in her back and I recommended tylenol as needed.

## 2012-09-18 NOTE — Telephone Encounter (Signed)
Please advise 

## 2012-09-19 NOTE — Telephone Encounter (Addendum)
Left message on voice mail for Brett Canales to return call.

## 2012-09-23 NOTE — Telephone Encounter (Signed)
Notified Katherine Rubio and he voices understanding. He reports that pt has recently been questioning him about the possibility of moving into an Assisted Living Facility. Advised him that pt may check into surrounding facilities in the area for further consideration and to let us know if pt needs anything from [place]. He voices understanding.

## 2012-10-14 ENCOUNTER — Telehealth: Payer: Self-pay | Admitting: Family

## 2012-10-14 NOTE — Telephone Encounter (Signed)
Opened in error

## 2012-10-15 ENCOUNTER — Encounter: Payer: Self-pay | Admitting: Family

## 2012-10-15 ENCOUNTER — Ambulatory Visit (INDEPENDENT_AMBULATORY_CARE_PROVIDER_SITE_OTHER): Payer: Medicare Other | Admitting: Family

## 2012-10-15 VITALS — BP 138/70 | HR 64 | Temp 98.2°F | Resp 16 | Wt 186.1 lb

## 2012-10-15 DIAGNOSIS — I10 Essential (primary) hypertension: Secondary | ICD-10-CM

## 2012-10-15 DIAGNOSIS — E785 Hyperlipidemia, unspecified: Secondary | ICD-10-CM

## 2012-10-15 DIAGNOSIS — M545 Low back pain: Secondary | ICD-10-CM

## 2012-10-15 LAB — BASIC METABOLIC PANEL
BUN: 25 mg/dL — ABNORMAL HIGH (ref 6–23)
CO2: 25 mEq/L (ref 19–32)
Chloride: 103 mEq/L (ref 96–112)
Potassium: 4.6 mEq/L (ref 3.5–5.3)

## 2012-10-15 NOTE — Patient Instructions (Addendum)
Please follow up in 3 months.  Come fasting to your appointment.

## 2012-10-15 NOTE — Progress Notes (Signed)
Subjective:    Patient ID: Katherine Rubio, female    DOB: [birth year], 79 y.o.   MRN: [id number]  HPI  Katherine Rubio is an 79 yr old female who presents today for follow up.  1) HTN-Last visit losartan was cut in half.  Reports this AM bp 154/70.   2) Hyperlipidemia- She had breakfast today- non fasting.  Continues pravastatin.   3) back pain- reports that she continues to have arthritis and back pain.      Review of Systems    see HPI  Past Medical History  Diagnosis Date  . Depression   . Hyperlipidemia   . Hypertension   . Chronic low back pain   . PONV (postoperative nausea and vomiting)   . GERD (gastroesophageal reflux disease)   . Osteoarthritis     History   Social History  . Marital Status: Widowed    Spouse Name: N/A    Number of Children: N/A  . Years of Education: N/A   Occupational History  . Not on file.   Social History Main Topics  . Smoking status: Never Smoker   . Smokeless tobacco: Never Used  . Alcohol Use: No  . Drug Use: No  . Sexually Active: Not on file   Other Topics Concern  . Not on file   Social History Narrative   Widowed   Retired as a Scientist, physiological.      Supportive daughter   Never Smoked   Alcohol use-no     Past Surgical History  Procedure Laterality Date  . Back surgery    . Knee surgery      left knee replacement  . Eye surgery      BIL CATARACT  . Kyphoplasty  05/15/2012    Procedure: KYPHOPLASTY;  Surgeon: Cristi Loron, MD;  Location: [facility];  Service: Neurosurgery;  Laterality: N/A;  Thoracic Six Kyphoplasty    Family History  Problem Relation Age of Onset  . Lung cancer Brother     deceased  . Stroke Father     deceased age 12  . Pulmonary fibrosis Son     deceased  . Osteoarthritis Brother     No Known Allergies  Current Outpatient Prescriptions on File Prior to Visit  Medication Sig Dispense Refill  . alendronate (FOSAMAX) 70 MG tablet Take 1 tablet (70 mg total) by mouth every  7 (seven) days. Take with a full glass of water on an empty stomach.  4 tablet  11  . bisoprolol-hydrochlorothiazide (ZIAC) 2.5-6.25 MG per tablet Take 1 tablet by mouth daily.      . bisoprolol-hydrochlorothiazide (ZIAC) 2.5-6.25 MG per tablet TAKE 1 TABLET BY MOUTH DAILY  90 tablet  1  . Calcium Carbonate-Vitamin D (CALTRATE 600+D) 600-400 MG-UNIT per tablet Take 1 tablet by mouth 2 (two) times daily.      Marland Kitchen losartan (COZAAR) 25 MG tablet Take 12.5 mg by mouth daily.       . pravastatin (PRAVACHOL) 20 MG tablet Take 1 tablet (20 mg total) by mouth at bedtime.  90 tablet  1  . Ranitidine HCl (ZANTAC PO) Take 1 tablet by mouth daily as needed. For heartburn.      . sertraline (ZOLOFT) 50 MG tablet TAKE 1 TABLET (50 MG TOTAL) BY MOUTH DAILY.  90 tablet  1   No current facility-administered medications on file prior to visit.    BP 138/70  Pulse 64  Temp(Src) 98.2 F (36.8 C) (Oral)  Resp 16  Wt 186 lb 1.3 oz (84.405 kg)  BMI 34.03 kg/m2  SpO2 97%    Objective:   Physical Exam  Constitutional: She is oriented to person, place, and time. She appears well-developed and well-nourished. No distress.  HENT:  Head: Normocephalic.  Cardiovascular: Normal rate and regular rhythm.   No murmur heard. Pulmonary/Chest: Effort normal and breath sounds normal. No respiratory distress. She has no wheezes. She has no rales. She exhibits no tenderness.  Musculoskeletal: She exhibits no edema.  Neurological: She is alert and oriented to person, place, and time.  Psychiatric: She has a normal mood and affect. Her behavior is normal. Judgment and thought content normal.          Assessment & Plan:

## 2012-10-16 ENCOUNTER — Encounter: Payer: Self-pay | Admitting: Family

## 2012-10-16 NOTE — Assessment & Plan Note (Signed)
BP Readings from Last 3 Encounters:  10/15/12 138/70  09/17/12 100/70  06/20/12 120/74   BP good here today in office. Cont current dose of losartan. Obtain bmet.

## 2012-10-16 NOTE — Assessment & Plan Note (Signed)
Tolerating statin, cont same. Plan flp next visit.

## 2012-10-16 NOTE — Assessment & Plan Note (Signed)
Unchanged. Recommended prn tylenol.

## 2012-12-06 ENCOUNTER — Other Ambulatory Visit: Payer: Self-pay | Admitting: Internal Medicine

## 2012-12-08 NOTE — Telephone Encounter (Signed)
Losartan request received for 25mg  daily. Sent direction change of 1/2 tablet daily, #15 x 2 refills.

## 2013-01-12 ENCOUNTER — Telehealth: Payer: Self-pay | Admitting: Family

## 2013-01-12 ENCOUNTER — Other Ambulatory Visit: Payer: Self-pay | Admitting: Family

## 2013-01-12 ENCOUNTER — Ambulatory Visit (INDEPENDENT_AMBULATORY_CARE_PROVIDER_SITE_OTHER): Payer: Medicare Other | Admitting: Family

## 2013-01-12 DIAGNOSIS — R7989 Other specified abnormal findings of blood chemistry: Secondary | ICD-10-CM

## 2013-01-12 DIAGNOSIS — R946 Abnormal results of thyroid function studies: Secondary | ICD-10-CM

## 2013-01-12 DIAGNOSIS — E785 Hyperlipidemia, unspecified: Secondary | ICD-10-CM

## 2013-01-12 DIAGNOSIS — I10 Essential (primary) hypertension: Secondary | ICD-10-CM

## 2013-01-12 LAB — BASIC METABOLIC PANEL
BUN: 25 mg/dL — ABNORMAL HIGH (ref 6–23)
Chloride: 107 mEq/L (ref 96–112)
Creat: 0.95 mg/dL (ref 0.50–1.10)
Glucose, Bld: 87 mg/dL (ref 70–99)
Potassium: 4.1 mEq/L (ref 3.5–5.3)

## 2013-01-12 LAB — HEPATIC FUNCTION PANEL
Bilirubin, Direct: 0.1 mg/dL (ref 0.0–0.3)
Indirect Bilirubin: 0.4 mg/dL (ref 0.0–0.9)

## 2013-01-12 LAB — LIPID PANEL
LDL Cholesterol: 141 mg/dL — ABNORMAL HIGH (ref 0–99)
VLDL: 38 mg/dL (ref 0–40)

## 2013-01-12 MED ORDER — SERTRALINE HCL 50 MG PO TABS: 50.0000 mg | ORAL_TABLET | Freq: Every day | ORAL | Status: DC

## 2013-01-12 NOTE — Telephone Encounter (Signed)
Patients nephew states that he was not able to come in with patient at todays visit but would like a call back as to how her appointment went today.

## 2013-01-12 NOTE — Assessment & Plan Note (Signed)
Hx of mild elevation of TSH, will repeat today.

## 2013-01-12 NOTE — Assessment & Plan Note (Signed)
Check FLP/LFT continue statin.

## 2013-01-12 NOTE — Assessment & Plan Note (Signed)
BP is stable on ziac and losartan, continue same.

## 2013-01-12 NOTE — Progress Notes (Signed)
Subjective:    Patient ID: Katherine Rubio, female    DOB: [birth year], 79 y.o.   MRN: [id number]  HPI  Ms. Takach is an 79 yr old female who presents today for follow up of multiple medical problems.    HTN-  She is maintained on ziac, losartan.  Denies CP or sob.  Hyperlipidemia- she is maintained on pravastatin- denies myalgia.  She requests a refill on her zoloft.     Review of Systems See HPI  Past Medical History  Diagnosis Date  . Depression   . Hyperlipidemia   . Hypertension   . Chronic low back pain   . PONV (postoperative nausea and vomiting)   . GERD (gastroesophageal reflux disease)   . Osteoarthritis     History   Social History  . Marital Status: Widowed    Spouse Name: N/A    Number of Children: N/A  . Years of Education: N/A   Occupational History  . Not on file.   Social History Main Topics  . Smoking status: Never Smoker   . Smokeless tobacco: Never Used  . Alcohol Use: No  . Drug Use: No  . Sexual Activity: Not on file   Other Topics Concern  . Not on file   Social History Narrative   Widowed   Retired as a Scientist, physiological.      Supportive daughter   Never Smoked   Alcohol use-no     Past Surgical History  Procedure Laterality Date  . Back surgery    . Knee surgery      left knee replacement  . Eye surgery      BIL CATARACT  . Kyphoplasty  05/15/2012    Procedure: KYPHOPLASTY;  Surgeon: Cristi Loron, MD;  Location: [facility];  Service: Neurosurgery;  Laterality: N/A;  Thoracic Six Kyphoplasty    Family History  Problem Relation Age of Onset  . Lung cancer Brother     deceased  . Stroke Father     deceased age 68  . Pulmonary fibrosis Son     deceased  . Osteoarthritis Brother     No Known Allergies  Current Outpatient Prescriptions on File Prior to Visit  Medication Sig Dispense Refill  . alendronate (FOSAMAX) 70 MG tablet Take 1 tablet (70 mg total) by mouth every 7 (seven) days. Take with a full glass  of water on an empty stomach.  4 tablet  11  . bisoprolol-hydrochlorothiazide (ZIAC) 2.5-6.25 MG per tablet Take 1 tablet by mouth daily.      . bisoprolol-hydrochlorothiazide (ZIAC) 2.5-6.25 MG per tablet TAKE 1 TABLET BY MOUTH DAILY  90 tablet  1  . Calcium Carbonate-Vitamin D (CALTRATE 600+D) 600-400 MG-UNIT per tablet Take 1 tablet by mouth 2 (two) times daily.      Marland Kitchen losartan (COZAAR) 25 MG tablet Take 0.5 tablets (12.5 mg total) by mouth daily.  15 tablet  2  . pravastatin (PRAVACHOL) 20 MG tablet Take 1 tablet (20 mg total) by mouth at bedtime.  90 tablet  1  . Ranitidine HCl (ZANTAC PO) Take 1 tablet by mouth daily as needed. For heartburn.      . sertraline (ZOLOFT) 50 MG tablet TAKE 1 TABLET (50 MG TOTAL) BY MOUTH DAILY.  90 tablet  1   No current facility-administered medications on file prior to visit.    There were no vitals taken for this visit.       Objective:   Physical Exam  Constitutional: She  is oriented to person, place, and time. She appears well-developed and well-nourished. No distress.  HENT:  Head: Normocephalic and atraumatic.  Cardiovascular: Normal rate and regular rhythm.   No murmur heard. Pulmonary/Chest: Effort normal and breath sounds normal. No respiratory distress. She has no wheezes. She has no rales. She exhibits no tenderness.  Musculoskeletal: She exhibits no edema.  Lymphadenopathy:    She has no cervical adenopathy.  Neurological: She is alert and oriented to person, place, and time.  Psychiatric: She has a normal mood and affect. Her behavior is normal. Judgment and thought content normal.          Assessment & Plan:

## 2013-01-12 NOTE — Telephone Encounter (Signed)
Spoke with pt's nephew. Advised him that pt will need a flu shot. He will arrange appointment for flu shot.

## 2013-01-15 ENCOUNTER — Telehealth: Payer: Self-pay | Admitting: Family

## 2013-01-15 LAB — T4, FREE: Free T4: 0.99 ng/dL (ref 0.80–1.80)

## 2013-01-15 LAB — T3, FREE: T3, Free: 3 pg/mL (ref 2.3–4.2)

## 2013-01-15 NOTE — Telephone Encounter (Addendum)
Opened in error

## 2013-01-17 ENCOUNTER — Other Ambulatory Visit: Payer: Self-pay | Admitting: Family

## 2013-01-19 NOTE — Telephone Encounter (Signed)
eScribe request for refill on Sertraline Last filled - 09.22.14, #30x5 [Done as No Print in Error] Last AEX - 09.22.14 Refill sent per [facility] refill protocol/SLS

## 2013-01-20 ENCOUNTER — Telehealth: Payer: Self-pay | Admitting: *Deleted

## 2013-01-20 DIAGNOSIS — E8809 Other disorders of plasma-protein metabolism, not elsewhere classified: Secondary | ICD-10-CM

## 2013-01-20 DIAGNOSIS — E785 Hyperlipidemia, unspecified: Secondary | ICD-10-CM

## 2013-01-20 NOTE — Telephone Encounter (Signed)
Notified pt's nephew, Alexis Frock and he voices understanding. He will follow up with pt re: medication compliance and let us know if higher dose needs to be sent in. Lab order entered.

## 2013-01-20 NOTE — Telephone Encounter (Signed)
Message copied by Kathi Simpers on Tue Jan 20, 2013  1:48 PM ------      Message from: O'SULLIVAN, MELISSA      Created: Thu Jan 15, 2013  9:00 PM       Please call pt and let her know that her thyroid function is mildly underactive.  If she is feeling well, it is reasonable for [place] to watch this and not start medication at this time. Kidney function is stable. Cholesterol is up. Please make sure she is taking pravastatin.  If so, she should increase pravastatin from 20mg  to 40mg . Repeat FLP/LFT in 1 month. Either way I would like her to repeat lft in 1 month because the Total protein was elevated. ------

## 2013-02-04 ENCOUNTER — Other Ambulatory Visit: Payer: Self-pay | Admitting: Family

## 2013-02-04 NOTE — Telephone Encounter (Signed)
Rx request to pharmacy/SLS  

## 2013-03-26 ENCOUNTER — Telehealth: Payer: Self-pay | Admitting: Family

## 2013-03-26 NOTE — Telephone Encounter (Signed)
Patient nephew Brett Canales called in stating that patient has a sore throat and chest congestion. I offered appointment but he says that patient does not want to come in to be seen. Brett Canales did say that patient is eating and that he just wanted to let Melissa know what was going on.

## 2013-03-26 NOTE — Telephone Encounter (Signed)
If fever, weakness, or if not improved in 2-3 days needs to be seen in office.

## 2013-03-26 NOTE — Telephone Encounter (Signed)
Caller informed, understood & agreed/SLS  

## 2013-03-27 ENCOUNTER — Ambulatory Visit (INDEPENDENT_AMBULATORY_CARE_PROVIDER_SITE_OTHER): Payer: Medicare Other | Admitting: Physician Assistant

## 2013-03-27 ENCOUNTER — Encounter: Payer: Self-pay | Admitting: Physician Assistant

## 2013-03-27 ENCOUNTER — Ambulatory Visit: Payer: Medicare Other | Admitting: Family

## 2013-03-27 VITALS — BP 118/78 | HR 70 | Temp 98.3°F | Resp 16 | Ht 62.0 in | Wt 183.0 lb

## 2013-03-27 DIAGNOSIS — Z9109 Other allergy status, other than to drugs and biological substances: Secondary | ICD-10-CM

## 2013-03-27 DIAGNOSIS — J069 Acute upper respiratory infection, unspecified: Secondary | ICD-10-CM

## 2013-03-27 MED ORDER — FLUTICASONE PROPIONATE 50 MCG/ACT NA SUSP: 2.0000 | Freq: Every day | NASAL | Status: DC

## 2013-03-27 MED ORDER — PRAVASTATIN SODIUM 20 MG PO TABS: 20.0000 mg | ORAL_TABLET | Freq: Every day | ORAL | Status: DC

## 2013-03-27 NOTE — Patient Instructions (Signed)
Increase fluid intake.  Rest.  Saline nasal spray.  Flonase daily.  Humidifier in the bedroom.  Tylenol and chloraseptic spray for throat.  Return to clinic if symptoms do not continue to improve.

## 2013-03-27 NOTE — Progress Notes (Signed)
Pre visit review using our clinic review tool, if applicable. No additional management support is needed unless otherwise documented below in the visit note/SLS  

## 2013-03-28 DIAGNOSIS — J069 Acute upper respiratory infection, unspecified: Secondary | ICD-10-CM | POA: Insufficient documentation

## 2013-03-28 NOTE — Assessment & Plan Note (Signed)
Giving short duration and improvement of symptoms, most likely viral in nature.  Increase fluids. Rest. Tylenol, Chloraseptic spray and salt water gargles for sore throat.  Humidifier in bedroom. Probiotic. Saline nasal spray. Rx Flonase.

## 2013-03-28 NOTE — Progress Notes (Signed)
Patient ID: Katherine Rubio, female   DOB: 19-Jun-1922, 79 y.o.   MRN: [id number]  Patient is 79 year old Caucasian female who presents to clinic today complaining of 2 days of nasal congestion, postnasal drip and sore throat.  Patient denies fevers, chills, aches, shortness of breath or wheezing.  Patient denies history of significant allergy or asthma.  Denies recent travel or sick contact.  States that she started feeling better late yesterday, but throat is still irritated today.   Past Medical History  Diagnosis Date  . Depression   . Hyperlipidemia   . Hypertension   . Chronic low back pain   . PONV (postoperative nausea and vomiting)   . GERD (gastroesophageal reflux disease)   . Osteoarthritis     Current Outpatient Prescriptions on File Prior to Visit  Medication Sig Dispense Refill  . alendronate (FOSAMAX) 70 MG tablet Take 1 tablet (70 mg total) by mouth every 7 (seven) days. Take with a full glass of water on an empty stomach.  4 tablet  11  . bisoprolol-hydrochlorothiazide (ZIAC) 2.5-6.25 MG per tablet TAKE 1 TABLET BY MOUTH DAILY  90 tablet  1  . Calcium Carbonate-Vitamin D (CALTRATE 600+D) 600-400 MG-UNIT per tablet Take 1 tablet by mouth 2 (two) times daily.      Marland Kitchen losartan (COZAAR) 25 MG tablet Take 0.5 tablets (12.5 mg total) by mouth daily.  15 tablet  2  . Ranitidine HCl (ZANTAC PO) Take 1 tablet by mouth daily as needed. For heartburn.      . sertraline (ZOLOFT) 50 MG tablet Take 1 tablet (50 mg total) by mouth daily.  30 tablet  5   No current facility-administered medications on file prior to visit.    No Known Allergies  Family History  Problem Relation Age of Onset  . Lung cancer Brother     deceased  . Stroke Father     deceased age 4  . Pulmonary fibrosis Son     deceased  . Osteoarthritis Brother     History   Social History  . Marital Status: Widowed    Spouse Name: N/A    Number of Children: N/A  . Years of Education: N/A   Social  History Main Topics  . Smoking status: Never Smoker   . Smokeless tobacco: Never Used  . Alcohol Use: No  . Drug Use: No  . Sexual Activity: None   Other Topics Concern  . None   Social History Narrative   Widowed   Retired as a Scientist, physiological.      Supportive daughter   Never Smoked   Alcohol use-no     Review of Systems - see history of present illness. All other review of systems are negative.   Filed Vitals:   03/27/13 1142  BP: 118/78  Pulse: 70  Temp: 98.3 F (36.8 C)  Resp: 16   Physical Exam  Constitutional: She is oriented to person, place, and time and well-developed, well-nourished, and in no distress.  HENT:  Head: Normocephalic and atraumatic.  Right Ear: External ear normal.  Left Ear: External ear normal.  Nose: Nose normal.  Mouth/Throat: Oropharynx is clear and moist.  Tympanic membranes within normal limits bilaterally.  Eyes: Conjunctivae are normal.  Neck: Neck supple.  Cardiovascular: Normal rate, regular rhythm, normal heart sounds and intact distal pulses.   Pulmonary/Chest: Effort normal and breath sounds normal. No respiratory distress. She has no wheezes. She has no rales. She exhibits no tenderness.  Lymphadenopathy:  She has no cervical adenopathy.  Neurological: She is alert and oriented to person, place, and time.  Skin: Skin is warm and dry. No rash noted.  Psychiatric: Affect normal.     Recent Results (from the past 2160 hour(s))  BASIC METABOLIC PANEL     Status: Abnormal   Collection Time    01/12/13 11:07 AM      Result Value Range   Sodium 137  135 - 145 mEq/L   Potassium 4.1  3.5 - 5.3 mEq/L   Chloride 107  96 - 112 mEq/L   CO2 24  19 - 32 mEq/L   Glucose, Bld 87  70 - 99 mg/dL   BUN 25 (*) 6 - 23 mg/dL   Creat 1.61  0.96 - 0.45 mg/dL   Calcium 9.7  8.4 - 40.9 mg/dL  LIPID PANEL     Status: Abnormal   Collection Time    01/12/13 11:07 AM      Result Value Range   Cholesterol 221 (*) 0 - 200 mg/dL   Comment: ATP  III Classification:           < 200        mg/dL        Desirable          200 - 239     mg/dL        Borderline High          >= 240        mg/dL        High         Triglycerides 188 (*) <150 mg/dL   HDL 42  >81 mg/dL   Total CHOL/HDL Ratio 5.3     VLDL 38  0 - 40 mg/dL   LDL Cholesterol 191 (*) 0 - 99 mg/dL   Comment:       Total Cholesterol/HDL Ratio:CHD Risk                            Coronary Heart Disease Risk Table                                            Men       Women              1/2 Average Risk              3.4        3.3                  Average Risk              5.0        4.4               2X Average Risk              9.6        7.1               3X Average Risk             23.4       11.0     Use the calculated Patient Ratio above and the CHD Risk table      to determine the patient's CHD Risk.     ATP III Classification (LDL):           <  100        mg/dL         Optimal          100 - 129     mg/dL         Near or Above Optimal          130 - 159     mg/dL         Borderline High          160 - 189     mg/dL         High           > 190        mg/dL         Very High        HEPATIC FUNCTION PANEL     Status: Abnormal   Collection Time    01/12/13 11:07 AM      Result Value Range   Total Bilirubin 0.5  0.3 - 1.2 mg/dL   Bilirubin, Direct 0.1  0.0 - 0.3 mg/dL   Indirect Bilirubin 0.4  0.0 - 0.9 mg/dL   Alkaline Phosphatase 61  39 - 117 U/L   AST 21  0 - 37 U/L   ALT 14  0 - 35 U/L   Total Protein 8.4 (*) 6.0 - 8.3 g/dL   Albumin 4.1  3.5 - 5.2 g/dL  TSH     Status: Abnormal   Collection Time    01/12/13 11:07 AM      Result Value Range   TSH 5.645 (*) 0.350 - 4.500 uIU/mL  T4, FREE     Status: None   Collection Time    01/12/13 11:07 AM      Result Value Range   Free T4 0.99  0.80 - 1.80 ng/dL  T3, FREE     Status: None   Collection Time    01/12/13 11:07 AM      Result Value Range   T3, Free 3.0  2.3 - 4.2 pg/mL    Assessment/Plan: Viral  URI Giving short duration and improvement of symptoms, most likely viral in nature.  Increase fluids. Rest. Tylenol, Chloraseptic spray and salt water gargles for sore throat.  Humidifier in bedroom. Probiotic. Saline nasal spray. Rx Flonase.

## 2013-04-14 ENCOUNTER — Telehealth: Payer: Self-pay | Admitting: Family

## 2013-04-14 MED ORDER — LOSARTAN POTASSIUM 25 MG PO TABS: 12.5000 mg | ORAL_TABLET | Freq: Every day | ORAL | Status: DC

## 2013-04-14 NOTE — Telephone Encounter (Signed)
Refill sent.

## 2013-04-14 NOTE — Telephone Encounter (Signed)
refill-losartan potassium 25mg  tab. Take 1/2 tablet daily. Qty 15 last fill 11.8.14

## 2013-07-02 ENCOUNTER — Telehealth: Payer: Self-pay | Admitting: Family

## 2013-07-02 ENCOUNTER — Ambulatory Visit (INDEPENDENT_AMBULATORY_CARE_PROVIDER_SITE_OTHER): Payer: Medicare Other | Admitting: Physician Assistant

## 2013-07-02 ENCOUNTER — Encounter: Payer: Self-pay | Admitting: Physician Assistant

## 2013-07-02 VITALS — BP 112/68 | HR 62 | Temp 97.6°F | Resp 16 | Ht 62.0 in | Wt 184.5 lb

## 2013-07-02 DIAGNOSIS — L02219 Cutaneous abscess of trunk, unspecified: Secondary | ICD-10-CM

## 2013-07-02 DIAGNOSIS — L03319 Cellulitis of trunk, unspecified: Secondary | ICD-10-CM

## 2013-07-02 DIAGNOSIS — L02212 Cutaneous abscess of back [any part, except buttock]: Secondary | ICD-10-CM

## 2013-07-02 MED ORDER — SULFAMETHOXAZOLE-TMP DS 800-160 MG PO TABS: 1.0000 | ORAL_TABLET | Freq: Two times a day (BID) | ORAL | Status: DC

## 2013-07-02 NOTE — Telephone Encounter (Signed)
Attempted to call patient's nephew.  LMOM for return call.

## 2013-07-02 NOTE — Telephone Encounter (Signed)
Patient nephew called and would like to be called regarding outcome of todays visit. He is on [place] and is power of attorney. He does not want [place] to let patient know that he is calling.

## 2013-07-02 NOTE — Patient Instructions (Signed)
Keep area clean and dry.  Clean 2 x daily with peroxide.  Can apply bandage when going out.  Let the region air out some at home.  Take antibiotic as prescribed.  Follow-up with myself or Melissa in 1 week.  Please call if you develop worsening redness, tenderness or develop a fever.  Abscess Care After An abscess (also called a boil or furuncle) is an infected area that contains a collection of pus. Signs and symptoms of an abscess include pain, tenderness, redness, or hardness, or you may feel a moveable soft area under your skin. An abscess can occur anywhere in the body. The infection may spread to surrounding tissues causing cellulitis. A cut (incision) by the surgeon was made over your abscess and the pus was drained out. Gauze may have been packed into the space to provide a drain that will allow the cavity to heal from the inside outwards. The boil may be painful for 5 to 7 days. Most people with a boil do not have high fevers. Your abscess, if seen early, may not have localized, and may not have been lanced. If not, another appointment may be required for this if it does not get better on its own or with medications. HOME CARE INSTRUCTIONS   Only take over-the-counter or prescription medicines for pain, discomfort, or fever as directed by your caregiver.  When you bathe, soak and then remove gauze or iodoform packs at least daily or as directed by your caregiver. You may then wash the wound gently with mild soapy water. Repack with gauze or do as your caregiver directs. SEEK IMMEDIATE MEDICAL CARE IF:   You develop increased pain, swelling, redness, drainage, or bleeding in the wound site.  You develop signs of generalized infection including muscle aches, chills, fever, or a general ill feeling.  An oral temperature above 102 F (38.9 C) develops, not controlled by medication. See your caregiver for a recheck if you develop any of the symptoms described above. If medications (antibiotics)  were prescribed, take them as directed. Document Released: 10/26/2004 Document Revised: 07/02/2011 Document Reviewed: 06/23/2007 [facility] Patient Information 2014 [place].

## 2013-07-02 NOTE — Progress Notes (Signed)
Pre visit review using our clinic review tool, if applicable. No additional management support is needed unless otherwise documented below in the visit note/SLS  

## 2013-07-05 DIAGNOSIS — L02212 Cutaneous abscess of back [any part, except buttock]: Secondary | ICD-10-CM | POA: Insufficient documentation

## 2013-07-05 NOTE — Assessment & Plan Note (Signed)
Draining on its own. Manual drainage performed without incision.  Area cleaned with peroxide and bandaged. Rx Bactrim. Patient and caretaker instructed on proper wound care measures. Followup in one week.

## 2013-07-05 NOTE — Progress Notes (Signed)
Patient presents to clinic today c/o draining this is at the mid thoracic back x1 week. Patient endorses some tenderness of lesion. Denies fever, chills, aches, malaise or fatigue. Denies insect or animal bite. Denies history of melanoma. Denies known history of MRSA infection.  Past Medical History  Diagnosis Date  . Depression   . Hyperlipidemia   . Hypertension   . Chronic low back pain   . PONV (postoperative nausea and vomiting)   . GERD (gastroesophageal reflux disease)   . Osteoarthritis     Current Outpatient Prescriptions on File Prior to Visit  Medication Sig Dispense Refill  . alendronate (FOSAMAX) 70 MG tablet Take 1 tablet (70 mg total) by mouth every 7 (seven) days. Take with a full glass of water on an empty stomach.  4 tablet  11  . bisoprolol-hydrochlorothiazide (ZIAC) 2.5-6.25 MG per tablet TAKE 1 TABLET BY MOUTH DAILY  90 tablet  1  . Calcium Carbonate-Vitamin D (CALTRATE 600+D) 600-400 MG-UNIT per tablet Take 1 tablet by mouth 2 (two) times daily.      Marland Kitchen losartan (COZAAR) 25 MG tablet Take 0.5 tablets (12.5 mg total) by mouth daily.  15 tablet  2  . pravastatin (PRAVACHOL) 20 MG tablet Take 1 tablet (20 mg total) by mouth at bedtime.  90 tablet  0  . Ranitidine HCl (ZANTAC PO) Take 1 tablet by mouth daily as needed. For heartburn.      . sertraline (ZOLOFT) 50 MG tablet Take 1 tablet (50 mg total) by mouth daily.  30 tablet  5   No current facility-administered medications on file prior to visit.    No Known Allergies  Family History  Problem Relation Age of Onset  . Lung cancer Brother     deceased  . Stroke Father     deceased age 81  . Pulmonary fibrosis Son     deceased  . Osteoarthritis Brother     History   Social History  . Marital Status: Widowed    Spouse Name: N/A    Number of Children: N/A  . Years of Education: N/A   Social History Main Topics  . Smoking status: Never Smoker   . Smokeless tobacco: Never Used  . Alcohol Use: No  . Drug  Use: No  . Sexual Activity: None   Other Topics Concern  . None   Social History Narrative   Widowed   Retired as a Research scientist (physical sciences).      Supportive daughter   Never Smoked   Alcohol use-no    Review of Systems - See HPI.  All other ROS are negative.  BP 112/68  Pulse 62  Temp(Src) 97.6 F (36.4 C) (Oral)  Resp 16  Ht 5\' 2"  (1.575 m)  Wt 184 lb 8 oz (83.689 kg)  BMI 33.74 kg/m2  SpO2 96%  Physical Exam  Vitals reviewed. Constitutional: She is oriented to person, place, and time and well-developed, well-nourished, and in no distress.  HENT:  Head: Normocephalic and atraumatic.  Eyes: Conjunctivae are normal. Pupils are equal, round, and reactive to light.  Neck: Neck supple.  Cardiovascular: Normal rate, regular rhythm and normal heart sounds.   Pulmonary/Chest: Effort normal and breath sounds normal.  Neurological: She is alert and oriented to person, place, and time.  Skin: Skin is warm and dry.  Presence of a 1.5 cm draining pus-filled simple abscess of the mid thoracic back.  No evidence of streaking or tracking.  Psychiatric: Affect normal.   No results found  for this or any previous visit (from the past 2160 hour(s)).  Assessment/Plan: Abscess of back, except buttock Draining on its own. Manual drainage performed without incision.  Area cleaned with peroxide and bandaged. Rx Bactrim. Patient and caretaker instructed on proper wound care measures. Followup in one week.   I spent 20 minutes with this patient, including time for abscess drainage.

## 2013-07-09 ENCOUNTER — Ambulatory Visit (INDEPENDENT_AMBULATORY_CARE_PROVIDER_SITE_OTHER): Payer: Medicare Other | Admitting: Physician Assistant

## 2013-07-09 ENCOUNTER — Encounter: Payer: Self-pay | Admitting: Physician Assistant

## 2013-07-09 ENCOUNTER — Ambulatory Visit (HOSPITAL_BASED_OUTPATIENT_CLINIC_OR_DEPARTMENT_OTHER)
Admission: RE | Admit: 2013-07-09 | Discharge: 2013-07-09 | Disposition: A | Discharge status: Home / Self Care | Payer: Medicare Other | Source: Ambulatory Visit | Attending: Physician Assistant | Admitting: Physician Assistant

## 2013-07-09 VITALS — BP 124/72 | HR 72 | Temp 97.5°F | Resp 16 | Ht 62.0 in | Wt 184.5 lb

## 2013-07-09 DIAGNOSIS — M545 Low back pain, unspecified: Secondary | ICD-10-CM | POA: Insufficient documentation

## 2013-07-09 DIAGNOSIS — L0291 Cutaneous abscess, unspecified: Secondary | ICD-10-CM

## 2013-07-09 DIAGNOSIS — L03319 Cellulitis of trunk, unspecified: Secondary | ICD-10-CM

## 2013-07-09 DIAGNOSIS — L02219 Cutaneous abscess of trunk, unspecified: Secondary | ICD-10-CM

## 2013-07-09 DIAGNOSIS — M51379 Other intervertebral disc degeneration, lumbosacral region without mention of lumbar back pain or lower extremity pain: Secondary | ICD-10-CM | POA: Insufficient documentation

## 2013-07-09 DIAGNOSIS — L02212 Cutaneous abscess of back [any part, except buttock]: Secondary | ICD-10-CM

## 2013-07-09 DIAGNOSIS — L0390 Cellulitis, unspecified: Secondary | ICD-10-CM

## 2013-07-09 DIAGNOSIS — M5137 Other intervertebral disc degeneration, lumbosacral region: Secondary | ICD-10-CM | POA: Insufficient documentation

## 2013-07-09 LAB — POCT URINALYSIS DIPSTICK
BILIRUBIN UA: NEGATIVE
Glucose, UA: NEGATIVE
KETONES UA: NEGATIVE
Leukocytes, UA: NEGATIVE
Nitrite, UA: NEGATIVE
PH UA: 5
PROTEIN UA: NEGATIVE
RBC UA: NEGATIVE
Urobilinogen, UA: 0.2

## 2013-07-09 MED ORDER — TRAMADOL HCL 50 MG PO TABS: 50.0000 mg | ORAL_TABLET | Freq: Two times a day (BID) | ORAL | Status: DC | PRN

## 2013-07-09 MED ORDER — CEPHALEXIN 500 MG PO CAPS: 500.0000 mg | ORAL_CAPSULE | Freq: Two times a day (BID) | ORAL | Status: DC

## 2013-07-09 NOTE — Assessment & Plan Note (Signed)
UA negative. Patient with osteoporosis.  Will obtain X-ray lumbar spine. Resume Fosamax. Moist heat.  Topical Aspercreme. Tramadol for severe pain.

## 2013-07-09 NOTE — Patient Instructions (Signed)
Please go downstairs for imaging.  I will call you with your results.  Reserve Tramadol for severe pain.  Apply topical Asperme.    For site on back -- continue wound care as discussed at last visit.  Continue warm compresses.  Take Keflex as directed.  You will be contacted for evaluation by a general surgeon.

## 2013-07-09 NOTE — Progress Notes (Signed)
Pre visit review using our clinic review tool, if applicable. No additional management support is needed unless otherwise documented below in the visit note/SLS  

## 2013-07-09 NOTE — Progress Notes (Signed)
Patient presents to clinic today for 1-week follow-up of abscess and cellulitis.  Endorses taking Bactrim as prescribed.  Took last pill this am. Endorses improvement in symptoms.  Denies redness, tenderness or drainage of abscess.  Denies fever, chills, sweats.  Patient endorses low back pain x 2 weeks. States pain has worsened over the past couple of days.  Pain worse with lying down.  Patient has diagnosis of osteoporosis and is being treated with once weekly Fosamax.  States she has not taken her Fosamax in several weeks.  Denies urinary urgency, frequency, hematuria, nausea, vomiting or flank pain.  Past Medical History  Diagnosis Date  . Depression   . Hyperlipidemia   . Hypertension   . Chronic low back pain   . PONV (postoperative nausea and vomiting)   . GERD (gastroesophageal reflux disease)   . Osteoarthritis     Current Outpatient Prescriptions on File Prior to Visit  Medication Sig Dispense Refill  . alendronate (FOSAMAX) 70 MG tablet Take 1 tablet (70 mg total) by mouth every 7 (seven) days. Take with a full glass of water on an empty stomach.  4 tablet  11  . bisoprolol-hydrochlorothiazide (ZIAC) 2.5-6.25 MG per tablet TAKE 1 TABLET BY MOUTH DAILY  90 tablet  1  . Calcium Carbonate-Vitamin D (CALTRATE 600+D) 600-400 MG-UNIT per tablet Take 1 tablet by mouth 2 (two) times daily.      Marland Kitchen losartan (COZAAR) 25 MG tablet Take 0.5 tablets (12.5 mg total) by mouth daily.  15 tablet  2  . pravastatin (PRAVACHOL) 20 MG tablet Take 1 tablet (20 mg total) by mouth at bedtime.  90 tablet  0  . Ranitidine HCl (ZANTAC PO) Take 1 tablet by mouth daily as needed. For heartburn.      . sertraline (ZOLOFT) 50 MG tablet Take 1 tablet (50 mg total) by mouth daily.  30 tablet  5   No current facility-administered medications on file prior to visit.    No Known Allergies  Family History  Problem Relation Age of Onset  . Lung cancer Brother     deceased  . Stroke Father     deceased age 46   . Pulmonary fibrosis Son     deceased  . Osteoarthritis Brother     History   Social History  . Marital Status: Widowed    Spouse Name: N/A    Number of Children: N/A  . Years of Education: N/A   Social History Main Topics  . Smoking status: Never Smoker   . Smokeless tobacco: Never Used  . Alcohol Use: No  . Drug Use: No  . Sexual Activity: None   Other Topics Concern  . None   Social History Narrative   Widowed   Retired as a Research scientist (physical sciences).      Supportive daughter   Never Smoked   Alcohol use-no    Review of Systems - See HPI.  All other ROS are negative.  BP 124/72  Pulse 72  Temp(Src) 97.5 F (36.4 C) (Oral)  Resp 16  Ht 5\' 2"  (1.575 m)  Wt 184 lb 8 oz (83.689 kg)  BMI 33.74 kg/m2  SpO2 96%  Physical Exam  Vitals reviewed. Constitutional: She is oriented to person, place, and time and well-developed, well-nourished, and in no distress.  HENT:  Head: Normocephalic and atraumatic.  Eyes: Conjunctivae are normal. Pupils are equal, round, and reactive to light.  Cardiovascular: Normal rate, regular rhythm and normal heart sounds.   Pulmonary/Chest: Effort normal  and breath sounds normal.  Musculoskeletal:       Lumbar back: She exhibits pain. She exhibits normal range of motion, no tenderness and no spasm.  Neurological: She is alert and oriented to person, place, and time.  Skin:  Area of abscess improved from last visit.  Still purulent drainage although scant in quantity.  No surrounding cellulitis.  Psychiatric: Affect normal.    Recent Results (from the past 2160 hour(s))  POCT URINALYSIS DIPSTICK     Status: None   Collection Time    07/09/13  1:35 PM      Result Value Ref Range   Color, UA gold     Clarity, UA dark     Glucose, UA neg     Bilirubin, UA neg     Ketones, UA neg     Spec Grav, UA >=1.030     Blood, UA neg     pH, UA 5.0     Protein, UA neg     Urobilinogen, UA 0.2     Nitrite, UA neg     Leukocytes, UA Negative       Assessment/Plan: LBP (low back pain) UA negative. Patient with osteoporosis.  Will obtain X-ray lumbar spine. Resume Fosamax. Moist heat.  Topical Aspercreme. Tramadol for severe pain.  Abscess of back, except buttock Improved but fluctuance still noted.  Concern for complex abscess.  RX Keflex.  Referral placed to General Surgery for evaluation.

## 2013-07-09 NOTE — Assessment & Plan Note (Signed)
Improved but fluctuance still noted.  Concern for complex abscess.  RX Keflex.  Referral placed to General Surgery for evaluation.

## 2013-07-10 ENCOUNTER — Ambulatory Visit (INDEPENDENT_AMBULATORY_CARE_PROVIDER_SITE_OTHER): Payer: Medicare Other | Admitting: Surgery

## 2013-07-10 ENCOUNTER — Telehealth: Payer: Self-pay | Admitting: Family

## 2013-07-10 ENCOUNTER — Encounter (INDEPENDENT_AMBULATORY_CARE_PROVIDER_SITE_OTHER): Payer: Self-pay | Admitting: Surgery

## 2013-07-10 VITALS — BP 132/82 | HR 74 | Temp 98.5°F | Resp 14 | Ht 62.0 in | Wt 186.0 lb

## 2013-07-10 DIAGNOSIS — L708 Other acne: Secondary | ICD-10-CM

## 2013-07-10 DIAGNOSIS — L700 Acne vulgaris: Secondary | ICD-10-CM | POA: Insufficient documentation

## 2013-07-10 NOTE — Telephone Encounter (Signed)
Notified pt's nephew and he voices understanding.

## 2013-07-10 NOTE — Telephone Encounter (Signed)
Patient nephew Katherine Rubio called requesting x-ray results. He states that patient is on her way now to [facility] and is complaining of back pain.

## 2013-07-10 NOTE — Progress Notes (Signed)
Subjective:     Patient ID: Katherine Rubio, female   DOB: 10-Dec-1922, 80 y.o.   MRN: [id number]  HPI  Note: This dictation was prepared with Dragon/digital dictation along with [facility] technology. Any transcriptional errors that result from this process are unintentional.       Katherine Rubio  1922/07/02 [id number]  Patient Care Team: Debbrah Alar, NP as PCP - General (Internal Medicine)  This patient is a 80 y.o.female who presents today for surgical evaluation at the request of Dr. Brunetta Jeans.   Reason for visit: Concern of back abscess  Notably woman who thought she felt a lump on her back about 2 weeks ago.  Plan Dr. Darl Householder care physician.  Concerned perhaps for a cyst.  Patient tells me that some material was able to be massaged out.  She was started on antibiotics.  Surgical consultation recommended.  We saw her urgently.  She denies pain.  She denies drainage.  No fevers chills or sweats.  No history of MRSA or skin abscesses anywhere else.  Patient Active Problem List   Diagnosis Date Noted  . LBP (low back pain) 07/09/2013  . Abscess of back, except buttock 07/05/2013  . Viral URI 03/28/2013  . Compression fracture 05/04/2012  . Constipation 05/04/2012  . Blepharitis of right upper eyelid 05/04/2012  . Back pain 04/18/2012  . Abnormal thyroid blood test 04/02/2012  . Vitamin D deficiency 04/02/2012  . CTS (carpal tunnel syndrome) 06/26/2011  . Knee pain 12/24/2010  . Osteopenia 09/13/2010  . HEARING LOSS 08/20/2008  . BACK PAIN, LUMBAR, CHRONIC 01/05/2008  . HYPERLIPIDEMIA 01/17/2007  . DEPRESSION 01/17/2007  . HYPERTENSION 01/17/2007  . OSTEOARTHRITIS 01/17/2007    Past Medical History  Diagnosis Date  . Depression   . Hyperlipidemia   . Hypertension   . Chronic low back pain   . PONV (postoperative nausea and vomiting)   . GERD (gastroesophageal reflux disease)   . Osteoarthritis     Past Surgical History  Procedure  Laterality Date  . Back surgery    . Knee surgery      left knee replacement  . Eye surgery      BIL CATARACT  . Kyphoplasty  05/15/2012    Procedure: KYPHOPLASTY;  Surgeon: [name], MD;  Location: [facility];  Service: Neurosurgery;  Laterality: N/A;  Thoracic Six Kyphoplasty    History   Social History  . Marital Status: Widowed    Spouse Name: N/A    Number of Children: N/A  . Years of Education: N/A   Occupational History  . Not on file.   Social History Main Topics  . Smoking status: Never Smoker   . Smokeless tobacco: Never Used  . Alcohol Use: No  . Drug Use: No  . Sexual Activity: Not on file   Other Topics Concern  . Not on file   Social History Narrative   Widowed   Retired as a Research scientist (physical sciences).      Supportive daughter   Never Smoked   Alcohol use-no     Family History  Problem Relation Age of Onset  . Lung cancer Brother     deceased  . Stroke Father     deceased age 49  . Pulmonary fibrosis Son     deceased  . Osteoarthritis Brother     Current Outpatient Prescriptions  Medication Sig Dispense Refill  . alendronate (FOSAMAX) 70 MG tablet Take 1 tablet (70 mg total) by mouth every 7 (  seven) days. Take with a full glass of water on an empty stomach.  4 tablet  11  . bisoprolol-hydrochlorothiazide (ZIAC) 2.5-6.25 MG per tablet TAKE 1 TABLET BY MOUTH DAILY  90 tablet  1  . Calcium Carbonate-Vitamin D (CALTRATE 600+D) 600-400 MG-UNIT per tablet Take 1 tablet by mouth 2 (two) times daily.      . cephALEXin (KEFLEX) 500 MG capsule Take 1 capsule (500 mg total) by mouth 2 (two) times daily.  14 capsule  0  . losartan (COZAAR) 25 MG tablet Take 0.5 tablets (12.5 mg total) by mouth daily.  15 tablet  2  . pravastatin (PRAVACHOL) 20 MG tablet Take 1 tablet (20 mg total) by mouth at bedtime.  90 tablet  0  . Ranitidine HCl (ZANTAC PO) Take 1 tablet by mouth daily as needed. For heartburn.      . sertraline (ZOLOFT) 50 MG tablet Take 1 tablet (50 mg  total) by mouth daily.  30 tablet  5  . traMADol (ULTRAM) 50 MG tablet Take 1 tablet (50 mg total) by mouth every 12 (twelve) hours as needed.  30 tablet  0  . sulfamethoxazole-trimethoprim (BACTRIM DS) 800-160 MG per tablet        No current facility-administered medications for this visit.     No Known Allergies  BP 132/82  Pulse 74  Temp(Src) 98.5 F (36.9 C) (Oral)  Resp 14  Ht 5\' 2"  (1.575 m)  Wt 186 lb (84.369 kg)  BMI 34.01 kg/m2  Dg Lumbar Spine Complete  07/09/2013   CLINICAL DATA:  Low back pain without injury.  EXAM: LUMBAR SPINE - COMPLETE 4+ VIEW  COMPARISON:  None.  FINDINGS: No fracture or spondylolisthesis is noted. Severe degenerative disc disease is noted at L1-2, L3-4, L4-5 and L5-S1. Mild degenerative disc disease is noted at L2-3. No significant abnormality seen involving the posterior facet joints.  IMPRESSION: Severe multilevel degenerative disc disease is noted. No acute abnormality seen in the lumbar spine.   Electronically Signed   By: Sabino Dick M.D.   On: 07/09/2013 15:46     Review of Systems  Constitutional: Negative for fever, chills and diaphoresis.  HENT: Negative for ear pain, sore throat and trouble swallowing.   Eyes: Negative for photophobia and visual disturbance.  Respiratory: Negative for cough and choking.   Cardiovascular: Negative for chest pain and palpitations.  Gastrointestinal: Negative for nausea, vomiting, abdominal pain, diarrhea, constipation, anal bleeding and rectal pain.  Genitourinary: Negative for dysuria, frequency and difficulty urinating.  Musculoskeletal: Positive for back pain and myalgias. Negative for gait problem.  Skin: Positive for wound. Negative for color change, pallor and rash.  Neurological: Negative for dizziness, speech difficulty, weakness and numbness.  Hematological: Negative for adenopathy.  Psychiatric/Behavioral: Negative for confusion and agitation. The patient is not nervous/anxious.          Objective:   Physical Exam  Constitutional: She is oriented to person, place, and time. She appears well-developed and well-nourished. No distress.  HENT:  Head: Normocephalic.  Mouth/Throat: Oropharynx is clear and moist. No oropharyngeal exudate.  Eyes: Conjunctivae and EOM are normal. Pupils are equal, round, and reactive to light. No scleral icterus.  Neck: Normal range of motion. No tracheal deviation present.  Cardiovascular: Normal rate and intact distal pulses.   Pulmonary/Chest: Effort normal. No respiratory distress. She exhibits no tenderness.  Abdominal: Soft. She exhibits no distension. There is no tenderness. Hernia confirmed negative in the right inguinal area and confirmed negative in  the left inguinal area.  Genitourinary: No vaginal discharge found.  Musculoskeletal: Normal range of motion. She exhibits no tenderness.       Arms: Lymphadenopathy:       Right: No inguinal adenopathy present.       Left: No inguinal adenopathy present.  Neurological: She is alert and oriented to person, place, and time. No cranial nerve deficit. She exhibits normal muscle tone. Coordination normal.  Skin: Skin is warm and dry. No rash noted. She is not diaphoretic.  Numerous seborrheic keratoses on entire body, especially back.  Psychiatric: She has a normal mood and affect. Her behavior is normal.       Assessment:     Small comedone/blackhead.  Not even a cyst.  No infection, abscess or cellulitis.     Plan:     I do not feel a discrete mass that warrants incision and drainage nor removal.  I cannot express any sebum, pus, blood or other abnormality.  I would complete the antibiotics as prescribed by her PCP MD.  Increase activity as tolerated to regular activity.  Low impact exercise such as walking an hour a day at least ideal.  Do not push through pain.  Diet as tolerated.  Low fat high fiber diet ideal.  Bowel regimen with 30 g fiber a day and fiber supplement as needed to  avoid problems.  Return to clinic as needed.   Instructions discussed.  Followup with primary care physician for other health issues as would normally be done.  Consider screening for malignancies (breast, prostate, colon, melanoma, etc) as appropriate.  Questions answered.  The patient expressed understanding and appreciation

## 2013-07-10 NOTE — Patient Instructions (Signed)
Please consider the recommendations that we have given you today:  You have a small commode/blackhead skin gland.  You do not have evidence of a cyst.  It is not infected.  It does not require excision.  Complete antibiotics as prescribed by your primary care physician Dr. Hassell Done  See the Handout(s) we have given you.  Please call our office at [phone] if you have further questions / concerns.

## 2013-07-13 LAB — WOUND CULTURE
GRAM STAIN: NONE SEEN
Gram Stain: NONE SEEN
Gram Stain: NONE SEEN
PRELIMINARY REPORT: NO GROWTH

## 2013-07-16 ENCOUNTER — Encounter (HOSPITAL_BASED_OUTPATIENT_CLINIC_OR_DEPARTMENT_OTHER): Payer: Self-pay | Admitting: Emergency Medicine

## 2013-07-16 ENCOUNTER — Emergency Department (HOSPITAL_BASED_OUTPATIENT_CLINIC_OR_DEPARTMENT_OTHER)
Admission: EM | Admit: 2013-07-16 | Discharge: 2013-07-16 | Disposition: A | Discharge status: Home / Self Care | Payer: Medicare Other | Attending: Emergency Medicine | Admitting: Emergency Medicine

## 2013-07-16 DIAGNOSIS — I10 Essential (primary) hypertension: Secondary | ICD-10-CM | POA: Insufficient documentation

## 2013-07-16 DIAGNOSIS — M545 Low back pain, unspecified: Secondary | ICD-10-CM | POA: Insufficient documentation

## 2013-07-16 DIAGNOSIS — Z8739 Personal history of other diseases of the musculoskeletal system and connective tissue: Secondary | ICD-10-CM | POA: Insufficient documentation

## 2013-07-16 DIAGNOSIS — Z792 Long term (current) use of antibiotics: Secondary | ICD-10-CM | POA: Insufficient documentation

## 2013-07-16 DIAGNOSIS — M549 Dorsalgia, unspecified: Secondary | ICD-10-CM

## 2013-07-16 DIAGNOSIS — G8929 Other chronic pain: Secondary | ICD-10-CM | POA: Insufficient documentation

## 2013-07-16 DIAGNOSIS — K219 Gastro-esophageal reflux disease without esophagitis: Secondary | ICD-10-CM | POA: Insufficient documentation

## 2013-07-16 DIAGNOSIS — Z9889 Other specified postprocedural states: Secondary | ICD-10-CM | POA: Insufficient documentation

## 2013-07-16 DIAGNOSIS — F3289 Other specified depressive episodes: Secondary | ICD-10-CM | POA: Insufficient documentation

## 2013-07-16 DIAGNOSIS — F329 Major depressive disorder, single episode, unspecified: Secondary | ICD-10-CM | POA: Insufficient documentation

## 2013-07-16 DIAGNOSIS — Z79899 Other long term (current) drug therapy: Secondary | ICD-10-CM | POA: Insufficient documentation

## 2013-07-16 DIAGNOSIS — E785 Hyperlipidemia, unspecified: Secondary | ICD-10-CM | POA: Insufficient documentation

## 2013-07-16 LAB — URINALYSIS, ROUTINE W REFLEX MICROSCOPIC
Glucose, UA: NEGATIVE mg/dL
HGB URINE DIPSTICK: NEGATIVE
KETONES UR: 15 mg/dL — AB
Leukocytes, UA: NEGATIVE
Nitrite: NEGATIVE
Protein, ur: NEGATIVE mg/dL
SPECIFIC GRAVITY, URINE: 1.03 (ref 1.005–1.030)
Urobilinogen, UA: 0.2 mg/dL (ref 0.0–1.0)
pH: 5 (ref 5.0–8.0)

## 2013-07-16 MED ORDER — HYDROCODONE-ACETAMINOPHEN 5-325 MG PO TABS: 1.0000 | ORAL_TABLET | Freq: Four times a day (QID) | ORAL | Status: DC | PRN

## 2013-07-16 MED ORDER — MORPHINE SULFATE 4 MG/ML IJ SOLN
6.0000 mg | Freq: Once | INTRAMUSCULAR | Status: AC
  Administered 2013-07-16: 4 mg via INTRAMUSCULAR
  Filled 2013-07-16: qty 2

## 2013-07-16 MED ORDER — MORPHINE SULFATE 2 MG/ML IJ SOLN
INTRAMUSCULAR | Status: AC
  Administered 2013-07-16: 2 mg via INTRAMUSCULAR
  Filled 2013-07-16: qty 1

## 2013-07-16 NOTE — ED Provider Notes (Signed)
Medical screening examination/treatment/procedure(s) were performed by non-physician practitioner and as supervising physician I was immediately available for consultation/collaboration.   EKG Interpretation None        Kashish Yglesias, MD 07/16/13 1816 

## 2013-07-16 NOTE — Discharge Instructions (Signed)
Stop taking the ultram after taking the vicodin Back Pain, Adult Low back pain is very common. About 1 in 5 people have back pain.The cause of low back pain is rarely dangerous. The pain often gets better over time.About half of people with a sudden onset of back pain feel better in just 2 weeks. About 8 in 10 people feel better by 6 weeks.  CAUSES Some common causes of back pain include:  Strain of the muscles or ligaments supporting the spine.  Wear and tear (degeneration) of the spinal discs.  Arthritis.  Direct injury to the back. DIAGNOSIS Most of the time, the direct cause of low back pain is not known.However, back pain can be treated effectively even when the exact cause of the pain is unknown.Answering your caregiver's questions about your overall health and symptoms is one of the most accurate ways to make sure the cause of your pain is not dangerous. If your caregiver needs more information, he or she may order lab work or imaging tests (X-rays or MRIs).However, even if imaging tests show changes in your back, this usually does not require surgery. HOME CARE INSTRUCTIONS For many people, back pain returns.Since low back pain is rarely dangerous, it is often a condition that people can learn to [facility] their own.   Remain active. It is stressful on the back to sit or stand in one place. Do not sit, drive, or stand in one place for more than 30 minutes at a time. Take short walks on level surfaces as soon as pain allows.Try to increase the length of time you walk each day.  Do not stay in bed.Resting more than 1 or 2 days can delay your recovery.  Do not avoid exercise or work.Your body is made to move.It is not dangerous to be active, even though your back may hurt.Your back will likely heal faster if you return to being active before your pain is gone.  Pay attention to your body when you bend and lift. Many people have less discomfortwhen lifting if they bend their  knees, keep the load close to their bodies,and avoid twisting. Often, the most comfortable positions are those that put less stress on your recovering back.  Find a comfortable position to sleep. Use a firm mattress and lie on your side with your knees slightly bent. If you lie on your back, put a pillow under your knees.  Only take over-the-counter or prescription medicines as directed by your caregiver. Over-the-counter medicines to reduce pain and inflammation are often the most helpful.Your caregiver may prescribe muscle relaxant drugs.These medicines help dull your pain so you can more quickly return to your normal activities and healthy exercise.  Put ice on the injured area.  Put ice in a plastic bag.  Place a towel between your skin and the bag.  Leave the ice on for 15-20 minutes, 03-04 times a day for the first 2 to 3 days. After that, ice and heat may be alternated to reduce pain and spasms.  Ask your caregiver about trying back exercises and gentle massage. This may be of some benefit.  Avoid feeling anxious or stressed.Stress increases muscle tension and can worsen back pain.It is important to recognize when you are anxious or stressed and learn ways to manage it.Exercise is a great option. SEEK MEDICAL CARE IF:  You have pain that is not relieved with rest or medicine.  You have pain that does not improve in 1 week.  You have new symptoms.  You are generally not feeling well. SEEK IMMEDIATE MEDICAL CARE IF:   You have pain that radiates from your back into your legs.  You develop new bowel or bladder control problems.  You have unusual weakness or numbness in your arms or legs.  You develop nausea or vomiting.  You develop abdominal pain.  You feel faint. Document Released: 04/09/2005 Document Revised: 10/09/2011 Document Reviewed: 08/28/2010 [facility] Patient Information 2014 [place], [place].

## 2013-07-16 NOTE — ED Notes (Signed)
Back pain x 2 weeks ago. Was seen by her MD last week and given pain medication.

## 2013-07-16 NOTE — ED Provider Notes (Signed)
CSN: [id number]     Arrival date & time 07/16/13  1542 History   First MD Initiated Contact with Patient 07/16/13 1553     Chief Complaint  Patient presents with  . Back Pain     (Consider location/radiation/quality/duration/timing/severity/associated sxs/prior Treatment) HPI Comments: Pt c/o lower back pain that started 2 weeks ago without any injury. Pt was seen by her pcp last week and was told that she had arthritis. Pt states that she is not getting any relief with the tramadol. Pt states that she is scheduled to see neurosurgery next week but because of the pain she came in. Pt denies numbness, weakness or incontinence. Pt states that she has a history of compression fracture in the t spine and it feels similar but the x-ray didn't show that  The history is provided by the patient. No language interpreter was used.    Past Medical History  Diagnosis Date  . Depression   . Hyperlipidemia   . Hypertension   . Chronic low back pain   . PONV (postoperative nausea and vomiting)   . GERD (gastroesophageal reflux disease)   . Osteoarthritis    Past Surgical History  Procedure Laterality Date  . Back surgery    . Knee surgery      left knee replacement  . Eye surgery      BIL CATARACT  . Kyphoplasty  05/15/2012    Procedure: KYPHOPLASTY;  Surgeon: [name], MD;  Location: [facility];  Service: Neurosurgery;  Laterality: N/A;  Thoracic Six Kyphoplasty   Family History  Problem Relation Age of Onset  . Lung cancer Brother     deceased  . Stroke Father     deceased age 77  . Pulmonary fibrosis Son     deceased  . Osteoarthritis Brother    History  Substance Use Topics  . Smoking status: Never Smoker   . Smokeless tobacco: Never Used  . Alcohol Use: No   OB History   Grav Para Term Preterm Abortions TAB SAB Ect Mult Living                 Review of Systems  Constitutional: Negative.   Respiratory: Negative.   Cardiovascular: Negative.        Allergies  Review of patient's allergies indicates no known allergies.  Home Medications   Current Outpatient Rx  Name  Route  Sig  Dispense  Refill  . alendronate (FOSAMAX) 70 MG tablet   Oral   Take 1 tablet (70 mg total) by mouth every 7 (seven) days. Take with a full glass of water on an empty stomach.   4 tablet   11   . bisoprolol-hydrochlorothiazide (ZIAC) 2.5-6.25 MG per tablet      TAKE 1 TABLET BY MOUTH DAILY   90 tablet   1   . Calcium Carbonate-Vitamin D (CALTRATE 600+D) 600-400 MG-UNIT per tablet   Oral   Take 1 tablet by mouth 2 (two) times daily.         . cephALEXin (KEFLEX) 500 MG capsule   Oral   Take 1 capsule (500 mg total) by mouth 2 (two) times daily.   14 capsule   0   . losartan (COZAAR) 25 MG tablet   Oral   Take 0.5 tablets (12.5 mg total) by mouth daily.   15 tablet   2   . pravastatin (PRAVACHOL) 20 MG tablet   Oral   Take 1 tablet (20 mg total) by  mouth at bedtime.   90 tablet   0   . Ranitidine HCl (ZANTAC PO)   Oral   Take 1 tablet by mouth daily as needed. For heartburn.         . sertraline (ZOLOFT) 50 MG tablet   Oral   Take 1 tablet (50 mg total) by mouth daily.   30 tablet   5   . sulfamethoxazole-trimethoprim (BACTRIM DS) 800-160 MG per tablet               . traMADol (ULTRAM) 50 MG tablet   Oral   Take 1 tablet (50 mg total) by mouth every 12 (twelve) hours as needed.   30 tablet   0    BP 130/69  Pulse 80  Temp(Src) 98.6 F (37 C) (Oral)  Resp 20  Ht 5\' 2"  (1.575 m)  Wt 186 lb (84.369 kg)  BMI 34.01 kg/m2  SpO2 97% Physical Exam  Nursing note and vitals reviewed. Constitutional: She is oriented to person, place, and time. She appears well-developed and well-nourished.  Cardiovascular: Normal rate and regular rhythm.   Pulmonary/Chest: Effort normal and breath sounds normal.  Abdominal: Soft. Bowel sounds are normal. There is no tenderness.  Musculoskeletal: Normal range of motion.   Lumbar spine and paraspinal tender. Pt is ambulating without any problems  Neurological: She is alert and oriented to person, place, and time. She exhibits normal muscle tone. Coordination normal.  Skin: Skin is warm and dry.  Psychiatric: She has a normal mood and affect.    ED Course  Procedures (including critical care time) Labs Review Labs Reviewed  URINALYSIS, ROUTINE W REFLEX MICROSCOPIC - Abnormal; Notable for the following:    Color, Urine AMBER (*)    Bilirubin Urine SMALL (*)    Ketones, ur 15 (*)    All other components within normal limits   Imaging Review No results found.   EKG Interpretation None      MDM   Final diagnoses:  Back pain    Pt feeling a lot better with the morphine. Pt is neurologically intact. X-ray from last week for degenerative changes. Will switch from ultram to hydrocodone. Pt is scheduled for follow up with neurosurgery next week.     Glendell Docker, NP 07/16/13 [phone]

## 2013-07-28 ENCOUNTER — Emergency Department (HOSPITAL_BASED_OUTPATIENT_CLINIC_OR_DEPARTMENT_OTHER)
Admission: EM | Admit: 2013-07-28 | Discharge: 2013-07-28 | Disposition: A | Discharge status: Home / Self Care | Payer: Medicare Other | Attending: Emergency Medicine | Admitting: Emergency Medicine

## 2013-07-28 ENCOUNTER — Other Ambulatory Visit: Payer: Self-pay | Admitting: Family

## 2013-07-28 ENCOUNTER — Encounter (HOSPITAL_BASED_OUTPATIENT_CLINIC_OR_DEPARTMENT_OTHER): Payer: Self-pay | Admitting: Emergency Medicine

## 2013-07-28 DIAGNOSIS — E785 Hyperlipidemia, unspecified: Secondary | ICD-10-CM | POA: Insufficient documentation

## 2013-07-28 DIAGNOSIS — M545 Low back pain, unspecified: Secondary | ICD-10-CM | POA: Insufficient documentation

## 2013-07-28 DIAGNOSIS — G8929 Other chronic pain: Secondary | ICD-10-CM | POA: Insufficient documentation

## 2013-07-28 DIAGNOSIS — Z79899 Other long term (current) drug therapy: Secondary | ICD-10-CM | POA: Insufficient documentation

## 2013-07-28 DIAGNOSIS — F329 Major depressive disorder, single episode, unspecified: Secondary | ICD-10-CM | POA: Insufficient documentation

## 2013-07-28 DIAGNOSIS — K219 Gastro-esophageal reflux disease without esophagitis: Secondary | ICD-10-CM | POA: Insufficient documentation

## 2013-07-28 DIAGNOSIS — M1990 Unspecified osteoarthritis, unspecified site: Secondary | ICD-10-CM | POA: Insufficient documentation

## 2013-07-28 DIAGNOSIS — F3289 Other specified depressive episodes: Secondary | ICD-10-CM | POA: Insufficient documentation

## 2013-07-28 DIAGNOSIS — Z9889 Other specified postprocedural states: Secondary | ICD-10-CM | POA: Insufficient documentation

## 2013-07-28 DIAGNOSIS — I10 Essential (primary) hypertension: Secondary | ICD-10-CM | POA: Insufficient documentation

## 2013-07-28 DIAGNOSIS — Z792 Long term (current) use of antibiotics: Secondary | ICD-10-CM | POA: Insufficient documentation

## 2013-07-28 LAB — URINE MICROSCOPIC-ADD ON

## 2013-07-28 LAB — URINALYSIS, ROUTINE W REFLEX MICROSCOPIC
Bilirubin Urine: NEGATIVE
GLUCOSE, UA: NEGATIVE mg/dL
Hgb urine dipstick: NEGATIVE
Ketones, ur: NEGATIVE mg/dL
Nitrite: NEGATIVE
PH: 5.5 (ref 5.0–8.0)
Protein, ur: NEGATIVE mg/dL
Specific Gravity, Urine: 1.02 (ref 1.005–1.030)
Urobilinogen, UA: 1 mg/dL (ref 0.0–1.0)

## 2013-07-28 MED ORDER — MORPHINE SULFATE 4 MG/ML IJ SOLN
4.0000 mg | Freq: Once | INTRAMUSCULAR | Status: AC
  Administered 2013-07-28: 4 mg via INTRAMUSCULAR
  Filled 2013-07-28: qty 1

## 2013-07-28 NOTE — ED Notes (Signed)
Pt states she was able to have a "good" bm, but was unable to void. amb back to room x 1 staff assist.

## 2013-07-28 NOTE — ED Notes (Signed)
Pt to room 11 by w/c, able to stand and walk to bed. Pt states she has had back pain for over one month, pt states she has had a spontaneous compression fracture in the past, had mri last week, but is not aware of the results. Pt states this feels exactly like her compression fracture pain but higher. Pt states she needs to have a bm, states she has been constipated from taking pain medications. Pt declines w/c or bedpan, amb to restroom with slow steady gait, contact guard assist provided. Pt given specimen cup and instructions for providing cc ua.

## 2013-07-28 NOTE — ED Notes (Signed)
Pt states she does not have the urge to void. Given sprite and encouraged to provide urine sample.

## 2013-07-28 NOTE — Discharge Instructions (Signed)
Back Pain, Adult Low back pain is very common. About 1 in 5 people have back pain.The cause of low back pain is rarely dangerous. The pain often gets better over time.About half of people with a sudden onset of back pain feel better in just 2 weeks. About 8 in 10 people feel better by 6 weeks.  CAUSES Some common causes of back pain include:  Strain of the muscles or ligaments supporting the spine.  Wear and tear (degeneration) of the spinal discs.  Arthritis.  Direct injury to the back. DIAGNOSIS Most of the time, the direct cause of low back pain is not known.However, back pain can be treated effectively even when the exact cause of the pain is unknown.Answering your caregiver's questions about your overall health and symptoms is one of the most accurate ways to make sure the cause of your pain is not dangerous. If your caregiver needs more information, he or she may order lab work or imaging tests (X-rays or MRIs).However, even if imaging tests show changes in your back, this usually does not require surgery. HOME CARE INSTRUCTIONS For many people, back pain returns.Since low back pain is rarely dangerous, it is often a condition that people can learn to manageon their own.   Remain active. It is stressful on the back to sit or stand in one place. Do not sit, drive, or stand in one place for more than 30 minutes at a time. Take short walks on level surfaces as soon as pain allows.Try to increase the length of time you walk each day.  Do not stay in bed.Resting more than 1 or 2 days can delay your recovery.  Do not avoid exercise or work.Your body is made to move.It is not dangerous to be active, even though your back may hurt.Your back will likely heal faster if you return to being active before your pain is gone.  Pay attention to your body when you bend and lift. Many people have less discomfortwhen lifting if they bend their knees, keep the load close to their bodies,and  avoid twisting. Often, the most comfortable positions are those that put less stress on your recovering back.  Find a comfortable position to sleep. Use a firm mattress and lie on your side with your knees slightly bent. If you lie on your back, put a pillow under your knees.  Only take over-the-counter or prescription medicines as directed by your caregiver. Over-the-counter medicines to reduce pain and inflammation are often the most helpful.Your caregiver may prescribe muscle relaxant drugs.These medicines help dull your pain so you can more quickly return to your normal activities and healthy exercise.  Put ice on the injured area.  Put ice in a plastic bag.  Place a towel between your skin and the bag.  Leave the ice on for 15-20 minutes, 03-04 times a day for the first 2 to 3 days. After that, ice and heat may be alternated to reduce pain and spasms.  Ask your caregiver about trying back exercises and gentle massage. This may be of some benefit.  Avoid feeling anxious or stressed.Stress increases muscle tension and can worsen back pain.It is important to recognize when you are anxious or stressed and learn ways to manage it.Exercise is a great option. SEEK MEDICAL CARE IF:  You have pain that is not relieved with rest or medicine.  You have pain that does not improve in 1 week.  You have new symptoms.  You are generally not feeling well. SEEK   IMMEDIATE MEDICAL CARE IF:   You have pain that radiates from your back into your legs.  You develop new bowel or bladder control problems.  You have unusual weakness or numbness in your arms or legs.  You develop nausea or vomiting.  You develop abdominal pain.  You feel faint. Document Released: 04/09/2005 Document Revised: 10/09/2011 Document Reviewed: 08/28/2010 [employer] Patient Information 2014 [employer].  

## 2013-07-28 NOTE — ED Notes (Signed)
Pt agrees to allow this nurse push her to the car in a w/c after encouragement and explanation that narcotics can cause unsteadiness.

## 2013-07-28 NOTE — ED Provider Notes (Signed)
CSN: [id number]     Arrival date & time 07/28/13  0741 History   First MD Initiated Contact with Patient 07/28/13 [phone]     Chief Complaint  Patient presents with  . Back Pain     (Consider location/radiation/quality/duration/timing/severity/associated sxs/prior Treatment) Patient is a 80 y.o. female presenting with back pain.  Back Pain Location:  Lumbar spine Quality:  Aching Radiates to:  Does not radiate Pain severity:  Severe Onset quality:  Gradual Duration: 3-4 weeks. Timing:  Constant Progression:  Worsening Chronicity:  New Context: not recent injury   Context comment:  Seen at her primary doctor's office, her neurosurgeon's office, and in the emergency department recently. She had an MRI done 3 days ago at her neurosurgeon's office. Results unknown. Relieved by:  Nothing Worsened by:  Movement, lying down and bending Ineffective treatments: Percocet. Associated symptoms: no abdominal pain, no bladder incontinence, no bowel incontinence, no dysuria, no fever, no leg pain, no numbness, no tingling and no weakness     Past Medical History  Diagnosis Date  . Depression   . Hyperlipidemia   . Hypertension   . Chronic low back pain   . PONV (postoperative nausea and vomiting)   . GERD (gastroesophageal reflux disease)   . Osteoarthritis    Past Surgical History  Procedure Laterality Date  . Back surgery    . Knee surgery      left knee replacement  . Eye surgery      BIL CATARACT  . Kyphoplasty  05/15/2012    Procedure: KYPHOPLASTY;  Surgeon: [name], MD;  Location: [facility];  Service: Neurosurgery;  Laterality: N/A;  Thoracic Six Kyphoplasty   Family History  Problem Relation Age of Onset  . Lung cancer Brother     deceased  . Stroke Father     deceased age 96  . Pulmonary fibrosis Son     deceased  . Osteoarthritis Brother    History  Substance Use Topics  . Smoking status: Never Smoker   . Smokeless tobacco: Never Used  . Alcohol Use:  No   OB History   Grav Para Term Preterm Abortions TAB SAB Ect Mult Living                 Review of Systems  Constitutional: Negative for fever.  Gastrointestinal: Negative for abdominal pain and bowel incontinence.  Genitourinary: Negative for bladder incontinence and dysuria.  Musculoskeletal: Positive for back pain.  Neurological: Negative for tingling, weakness and numbness.  All other systems reviewed and are negative.      Allergies  Review of patient's allergies indicates no known allergies.  Home Medications   Current Outpatient Rx  Name  Route  Sig  Dispense  Refill  . oxyCODONE-acetaminophen (PERCOCET) 5-325 MG per tablet   Oral   Take by mouth every 4 (four) hours as needed for severe pain.         Marland Kitchen alendronate (FOSAMAX) 70 MG tablet   Oral   Take 1 tablet (70 mg total) by mouth every 7 (seven) days. Take with a full glass of water on an empty stomach.   4 tablet   11   . bisoprolol-hydrochlorothiazide (ZIAC) 2.5-6.25 MG per tablet      TAKE 1 TABLET BY MOUTH DAILY   90 tablet   1   . Calcium Carbonate-Vitamin D (CALTRATE 600+D) 600-400 MG-UNIT per tablet   Oral   Take 1 tablet by mouth 2 (two) times daily.         Marland Kitchen  cephALEXin (KEFLEX) 500 MG capsule   Oral   Take 1 capsule (500 mg total) by mouth 2 (two) times daily.   14 capsule   0   . HYDROcodone-acetaminophen (NORCO/VICODIN) 5-325 MG per tablet   Oral   Take 1-2 tablets by mouth every 6 (six) hours as needed.   25 tablet   0   . losartan (COZAAR) 25 MG tablet   Oral   Take 0.5 tablets (12.5 mg total) by mouth daily.   15 tablet   2   . pravastatin (PRAVACHOL) 20 MG tablet   Oral   Take 1 tablet (20 mg total) by mouth at bedtime.   90 tablet   0   . Ranitidine HCl (ZANTAC PO)   Oral   Take 1 tablet by mouth daily as needed. For heartburn.         . sertraline (ZOLOFT) 50 MG tablet   Oral   Take 1 tablet (50 mg total) by mouth daily.   30 tablet   5   .  sulfamethoxazole-trimethoprim (BACTRIM DS) 800-160 MG per tablet               . traMADol (ULTRAM) 50 MG tablet   Oral   Take 1 tablet (50 mg total) by mouth every 12 (twelve) hours as needed.   30 tablet   0    BP 172/70  Pulse 65  Temp(Src) 98.3 F (36.8 C) (Oral)  Resp 18  Ht 5\' 4"  (1.626 m)  Wt 184 lb (83.462 kg)  BMI 31.57 kg/m2  SpO2 97% Physical Exam  Nursing note and vitals reviewed. Constitutional: She is oriented to person, place, and time. She appears well-developed and well-nourished. No distress.  HENT:  Head: Normocephalic and atraumatic.  Mouth/Throat: Oropharynx is clear and moist.  Eyes: Conjunctivae are normal. Pupils are equal, round, and reactive to light. No scleral icterus.  Neck: Neck supple.  Cardiovascular: Normal rate, regular rhythm, normal heart sounds and intact distal pulses.   No murmur heard. Pulmonary/Chest: Effort normal and breath sounds normal. No stridor. No respiratory distress. She has no rales.  Abdominal: Soft. Bowel sounds are normal. She exhibits no distension. There is no tenderness.  Musculoskeletal: Normal range of motion.       Lumbar back: She exhibits tenderness and bony tenderness.  Low back mildly tender to palpation  Neurological: She is alert and oriented to person, place, and time.  Reflex Scores:      Patellar reflexes are 2+ on the right side and 2+ on the left side. Normal bilateral lower extremity strength Able to ambulate with assistance (uses walker at home)  Skin: Skin is warm and dry. No rash noted.  Psychiatric: She has a normal mood and affect. Her behavior is normal.    ED Course  Procedures (including critical care time) Labs Review Labs Reviewed  URINALYSIS, ROUTINE W REFLEX MICROSCOPIC - Abnormal; Notable for the following:    Leukocytes, UA TRACE (*)    All other components within normal limits  URINE MICROSCOPIC-ADD ON - Abnormal; Notable for the following:    Bacteria, UA FEW (*)    All other  components within normal limits   Imaging Review No results found.   EKG Interpretation None      MDM   Final diagnoses:  Low back pain    80 yo female with hx of chronic low back pain presenting with low back pain.  No fevers, no lower extremity weakness, tingling, numbness.  No loss of bowel or bladder function.  Able to ambulate.  Neurologic testing normal.    She had an MRI done outpatient 2 days ago.  I spoke with Dr. Kathyrn Sheriff, who reviewed the film.  He reported severe degenerative changes, but no surgical or acute findings.  I don't think any repeat imaging today would be beneficial.    Morphine improved her pain. She will followup with her primary doctor and her neurosurgeon.  Houston Siren III, MD 07/28/13 1020

## 2013-08-04 ENCOUNTER — Telehealth: Payer: Self-pay | Admitting: Family

## 2013-08-04 DIAGNOSIS — H9190 Unspecified hearing loss, unspecified ear: Secondary | ICD-10-CM

## 2013-08-04 NOTE — Telephone Encounter (Signed)
Patient needs a referral to see Dr. Patel(audiologist)again since it has been awhile since she has seen him. Please place referral.

## 2013-08-10 ENCOUNTER — Other Ambulatory Visit: Payer: Self-pay | Admitting: Neurosurgery

## 2013-08-14 ENCOUNTER — Encounter (HOSPITAL_COMMUNITY): Payer: Self-pay | Admitting: *Deleted

## 2013-08-16 MED ORDER — CEFAZOLIN SODIUM-DEXTROSE 2-3 GM-% IV SOLR: 2.0000 g | INTRAVENOUS | Status: DC

## 2013-08-17 ENCOUNTER — Inpatient Hospital Stay (HOSPITAL_COMMUNITY): Payer: Medicare Other | Admitting: Anesthesiology

## 2013-08-17 ENCOUNTER — Encounter (HOSPITAL_COMMUNITY): Payer: Medicare Other | Admitting: Anesthesiology

## 2013-08-17 ENCOUNTER — Encounter (HOSPITAL_COMMUNITY): Payer: Self-pay | Admitting: *Deleted

## 2013-08-17 ENCOUNTER — Encounter (HOSPITAL_COMMUNITY)
Admission: RE | Disposition: A | Discharge status: Home Health | Payer: Self-pay | Source: Ambulatory Visit | Attending: Neurosurgery

## 2013-08-17 ENCOUNTER — Inpatient Hospital Stay (HOSPITAL_COMMUNITY)
Admission: RE | Admit: 2013-08-17 | Discharge: 2013-08-31 | DRG: 519 | Disposition: A | Discharge status: Home Health | Payer: Medicare Other | Source: Ambulatory Visit | Attending: Neurosurgery | Admitting: Neurosurgery

## 2013-08-17 ENCOUNTER — Inpatient Hospital Stay (HOSPITAL_COMMUNITY): Payer: Medicare Other

## 2013-08-17 DIAGNOSIS — F29 Unspecified psychosis not due to a substance or known physiological condition: Secondary | ICD-10-CM | POA: Diagnosis not present

## 2013-08-17 DIAGNOSIS — G969 Disorder of central nervous system, unspecified: Secondary | ICD-10-CM | POA: Diagnosis not present

## 2013-08-17 DIAGNOSIS — I10 Essential (primary) hypertension: Secondary | ICD-10-CM | POA: Diagnosis present

## 2013-08-17 DIAGNOSIS — G9619 Other disorders of meninges, not elsewhere classified: Secondary | ICD-10-CM

## 2013-08-17 DIAGNOSIS — IMO0002 Reserved for concepts with insufficient information to code with codable children: Secondary | ICD-10-CM | POA: Diagnosis not present

## 2013-08-17 DIAGNOSIS — M48062 Spinal stenosis, lumbar region with neurogenic claudication: Secondary | ICD-10-CM | POA: Diagnosis present

## 2013-08-17 DIAGNOSIS — T40605A Adverse effect of unspecified narcotics, initial encounter: Secondary | ICD-10-CM | POA: Diagnosis not present

## 2013-08-17 DIAGNOSIS — K219 Gastro-esophageal reflux disease without esophagitis: Secondary | ICD-10-CM | POA: Diagnosis present

## 2013-08-17 DIAGNOSIS — G9741 Accidental puncture or laceration of dura during a procedure: Secondary | ICD-10-CM | POA: Diagnosis not present

## 2013-08-17 DIAGNOSIS — Z79899 Other long term (current) drug therapy: Secondary | ICD-10-CM

## 2013-08-17 DIAGNOSIS — Y838 Other surgical procedures as the cause of abnormal reaction of the patient, or of later complication, without mention of misadventure at the time of the procedure: Secondary | ICD-10-CM | POA: Diagnosis not present

## 2013-08-17 DIAGNOSIS — F3289 Other specified depressive episodes: Secondary | ICD-10-CM | POA: Diagnosis present

## 2013-08-17 DIAGNOSIS — E785 Hyperlipidemia, unspecified: Secondary | ICD-10-CM | POA: Diagnosis present

## 2013-08-17 DIAGNOSIS — Z96659 Presence of unspecified artificial knee joint: Secondary | ICD-10-CM

## 2013-08-17 DIAGNOSIS — M1990 Unspecified osteoarthritis, unspecified site: Secondary | ICD-10-CM | POA: Diagnosis present

## 2013-08-17 DIAGNOSIS — Z9889 Other specified postprocedural states: Secondary | ICD-10-CM

## 2013-08-17 DIAGNOSIS — G96198 Other disorders of meninges, not elsewhere classified: Secondary | ICD-10-CM | POA: Diagnosis not present

## 2013-08-17 DIAGNOSIS — F329 Major depressive disorder, single episode, unspecified: Secondary | ICD-10-CM | POA: Diagnosis present

## 2013-08-17 DIAGNOSIS — Y92009 Unspecified place in unspecified non-institutional (private) residence as the place of occurrence of the external cause: Secondary | ICD-10-CM

## 2013-08-17 DIAGNOSIS — T424X5A Adverse effect of benzodiazepines, initial encounter: Secondary | ICD-10-CM | POA: Diagnosis not present

## 2013-08-17 HISTORY — PX: LUMBAR LAMINECTOMY/DECOMPRESSION MICRODISCECTOMY: SHX5026

## 2013-08-17 HISTORY — PX: LUMBAR LAMINECTOMY: SHX95

## 2013-08-17 LAB — CBC
HCT: 38.4 % (ref 36.0–46.0)
Hemoglobin: 13 g/dL (ref 12.0–15.0)
MCH: 32.2 pg (ref 26.0–34.0)
MCHC: 33.9 g/dL (ref 30.0–36.0)
MCV: 95 fL (ref 78.0–100.0)
PLATELETS: 260 10*3/uL (ref 150–400)
RBC: 4.04 MIL/uL (ref 3.87–5.11)
RDW: 13.3 % (ref 11.5–15.5)
WBC: 7.8 10*3/uL (ref 4.0–10.5)

## 2013-08-17 LAB — BASIC METABOLIC PANEL
BUN: 26 mg/dL — ABNORMAL HIGH (ref 6–23)
CO2: 23 mEq/L (ref 19–32)
Calcium: 8.9 mg/dL (ref 8.4–10.5)
Chloride: 102 mEq/L (ref 96–112)
Creatinine, Ser: 0.89 mg/dL (ref 0.50–1.10)
GFR, EST AFRICAN AMERICAN: 65 mL/min — AB (ref >90)
GFR, EST NON AFRICAN AMERICAN: 56 mL/min — AB (ref >90)
GLUCOSE: 89 mg/dL (ref 70–99)
Potassium: 4.1 mEq/L (ref 3.7–5.3)
SODIUM: 138 meq/L (ref 137–147)

## 2013-08-17 LAB — GLUCOSE, CAPILLARY
GLUCOSE-CAPILLARY: 130 mg/dL — AB (ref 70–99)
GLUCOSE-CAPILLARY: 130 mg/dL — AB (ref 70–99)

## 2013-08-17 LAB — SURGICAL PCR SCREEN
MRSA, PCR: NEGATIVE
STAPHYLOCOCCUS AUREUS: POSITIVE — AB

## 2013-08-17 LAB — MRSA PCR SCREENING: MRSA BY PCR: NEGATIVE

## 2013-08-17 SURGERY — LUMBAR LAMINECTOMY/DECOMPRESSION MICRODISCECTOMY 4 LEVEL
Anesthesia: General | Site: Spine Lumbar

## 2013-08-17 MED ORDER — PROPOFOL 10 MG/ML IV BOLUS
INTRAVENOUS | Status: DC | PRN
  Administered 2013-08-17: 150 mg via INTRAVENOUS

## 2013-08-17 MED ORDER — GLYCOPYRROLATE 0.2 MG/ML IJ SOLN
INTRAMUSCULAR | Status: DC | PRN
  Administered 2013-08-17: 0.4 mg via INTRAVENOUS

## 2013-08-17 MED ORDER — BISOPROLOL-HYDROCHLOROTHIAZIDE 2.5-6.25 MG PO TABS
1.0000 | ORAL_TABLET | Freq: Every day | ORAL | Status: DC
  Administered 2013-08-17 – 2013-08-19 (×2): 1 via ORAL
  Filled 2013-08-17 (×4): qty 1

## 2013-08-17 MED ORDER — THROMBIN 20000 UNITS EX SOLR
CUTANEOUS | Status: DC | PRN
  Administered 2013-08-17: 14:00:00 via TOPICAL

## 2013-08-17 MED ORDER — MUPIROCIN 2 % EX OINT
TOPICAL_OINTMENT | CUTANEOUS | Status: AC
  Administered 2013-08-17: 1 via NASAL
  Filled 2013-08-17: qty 22

## 2013-08-17 MED ORDER — OXYCODONE-ACETAMINOPHEN 5-325 MG PO TABS
1.0000 | ORAL_TABLET | ORAL | Status: DC | PRN
  Administered 2013-08-17 – 2013-08-18 (×6): 2 via ORAL
  Filled 2013-08-17 (×6): qty 2

## 2013-08-17 MED ORDER — MENTHOL 3 MG MT LOZG
1.0000 | LOZENGE | OROMUCOSAL | Status: DC | PRN
  Filled 2013-08-17: qty 9

## 2013-08-17 MED ORDER — THROMBIN 5000 UNITS EX SOLR
OROMUCOSAL | Status: DC | PRN
  Administered 2013-08-17: 15:00:00 via TOPICAL

## 2013-08-17 MED ORDER — PROPOFOL 10 MG/ML IV BOLUS
INTRAVENOUS | Status: AC
  Filled 2013-08-17: qty 20

## 2013-08-17 MED ORDER — FENTANYL CITRATE 0.05 MG/ML IJ SOLN
INTRAMUSCULAR | Status: DC | PRN
  Administered 2013-08-17 (×2): 50 ug via INTRAVENOUS
  Administered 2013-08-17: 100 ug via INTRAVENOUS
  Administered 2013-08-17: 50 ug via INTRAVENOUS

## 2013-08-17 MED ORDER — HEMOSTATIC AGENTS (NO CHARGE) OPTIME
TOPICAL | Status: DC | PRN
  Administered 2013-08-17: 1 via TOPICAL

## 2013-08-17 MED ORDER — ONDANSETRON HCL 4 MG/2ML IJ SOLN
4.0000 mg | INTRAMUSCULAR | Status: DC | PRN
  Administered 2013-08-19: 4 mg via INTRAVENOUS
  Filled 2013-08-17: qty 2

## 2013-08-17 MED ORDER — DIPHENHYDRAMINE HCL 25 MG PO CAPS
25.0000 mg | ORAL_CAPSULE | Freq: Four times a day (QID) | ORAL | Status: DC | PRN
  Administered 2013-08-23 – 2013-08-30 (×4): 25 mg via ORAL
  Filled 2013-08-17 (×4): qty 1

## 2013-08-17 MED ORDER — LIDOCAINE HCL (CARDIAC) 20 MG/ML IV SOLN
INTRAVENOUS | Status: DC | PRN
  Administered 2013-08-17: 60 mg via INTRAVENOUS

## 2013-08-17 MED ORDER — NEOSTIGMINE METHYLSULFATE 1 MG/ML IJ SOLN
INTRAMUSCULAR | Status: AC
  Filled 2013-08-17: qty 10

## 2013-08-17 MED ORDER — BUPIVACAINE LIPOSOME 1.3 % IJ SUSP
INTRAMUSCULAR | Status: DC | PRN
  Administered 2013-08-17: 20 mL

## 2013-08-17 MED ORDER — DEXAMETHASONE SODIUM PHOSPHATE 4 MG/ML IJ SOLN
INTRAMUSCULAR | Status: AC
  Filled 2013-08-17: qty 2

## 2013-08-17 MED ORDER — DEXAMETHASONE SODIUM PHOSPHATE 10 MG/ML IJ SOLN
INTRAMUSCULAR | Status: DC | PRN
  Administered 2013-08-17: 8 mg via INTRAVENOUS

## 2013-08-17 MED ORDER — NEOSTIGMINE METHYLSULFATE 1 MG/ML IJ SOLN
INTRAMUSCULAR | Status: DC | PRN
  Administered 2013-08-17: 3 mg via INTRAVENOUS

## 2013-08-17 MED ORDER — THROMBIN 5000 UNITS EX SOLR
CUTANEOUS | Status: DC | PRN
  Administered 2013-08-17 (×2): 5000 [IU] via TOPICAL

## 2013-08-17 MED ORDER — 0.9 % SODIUM CHLORIDE (POUR BTL) OPTIME
TOPICAL | Status: DC | PRN
  Administered 2013-08-17: 1000 mL

## 2013-08-17 MED ORDER — SERTRALINE HCL 50 MG PO TABS
50.0000 mg | ORAL_TABLET | Freq: Every day | ORAL | Status: DC
  Administered 2013-08-18 – 2013-08-31 (×14): 50 mg via ORAL
  Filled 2013-08-17 (×14): qty 1

## 2013-08-17 MED ORDER — BUPIVACAINE LIPOSOME 1.3 % IJ SUSP
20.0000 mL | Freq: Once | INTRAMUSCULAR | Status: DC
  Filled 2013-08-17: qty 20

## 2013-08-17 MED ORDER — PHENYLEPHRINE HCL 10 MG/ML IJ SOLN
10.0000 mg | INTRAMUSCULAR | Status: DC | PRN
  Administered 2013-08-17: 20 ug/min via INTRAVENOUS

## 2013-08-17 MED ORDER — MUPIROCIN 2 % EX OINT
TOPICAL_OINTMENT | Freq: Two times a day (BID) | CUTANEOUS | Status: DC
  Administered 2013-08-17 (×2): 1 via NASAL
  Administered 2013-08-18 – 2013-08-21 (×7): via NASAL
  Administered 2013-08-21: 1 via NASAL
  Administered 2013-08-22 – 2013-08-29 (×15): via NASAL
  Administered 2013-08-29: 1 via NASAL
  Administered 2013-08-30 – 2013-08-31 (×3): via NASAL
  Filled 2013-08-17: qty 22

## 2013-08-17 MED ORDER — FENTANYL CITRATE 0.05 MG/ML IJ SOLN
INTRAMUSCULAR | Status: AC
  Filled 2013-08-17: qty 5

## 2013-08-17 MED ORDER — MORPHINE SULFATE 2 MG/ML IJ SOLN
1.0000 mg | INTRAMUSCULAR | Status: DC | PRN
  Administered 2013-08-18 – 2013-08-19 (×3): 4 mg via INTRAVENOUS
  Administered 2013-08-19: 2 mg via INTRAVENOUS
  Administered 2013-08-19: 4 mg via INTRAVENOUS
  Administered 2013-08-20 (×2): 2 mg via INTRAVENOUS
  Administered 2013-08-20 (×2): 4 mg via INTRAVENOUS
  Administered 2013-08-21 – 2013-08-22 (×4): 2 mg via INTRAVENOUS
  Administered 2013-08-22: 4 mg via INTRAVENOUS
  Administered 2013-08-23: 2 mg via INTRAVENOUS
  Filled 2013-08-17 (×4): qty 2
  Filled 2013-08-17: qty 1
  Filled 2013-08-17: qty 2
  Filled 2013-08-17 (×4): qty 1
  Filled 2013-08-17: qty 2
  Filled 2013-08-17 (×2): qty 1
  Filled 2013-08-17 (×2): qty 2
  Filled 2013-08-17: qty 1

## 2013-08-17 MED ORDER — TRAMADOL HCL 50 MG PO TABS
50.0000 mg | ORAL_TABLET | Freq: Four times a day (QID) | ORAL | Status: DC | PRN
  Administered 2013-08-19 – 2013-08-31 (×23): 50 mg via ORAL
  Filled 2013-08-17 (×25): qty 1

## 2013-08-17 MED ORDER — SODIUM CHLORIDE 0.9 % IR SOLN
Status: DC | PRN
  Administered 2013-08-17: 14:00:00

## 2013-08-17 MED ORDER — DIAZEPAM 5 MG PO TABS
5.0000 mg | ORAL_TABLET | Freq: Four times a day (QID) | ORAL | Status: DC | PRN
  Administered 2013-08-18 – 2013-08-24 (×12): 5 mg via ORAL
  Filled 2013-08-17 (×13): qty 1

## 2013-08-17 MED ORDER — SIMVASTATIN 10 MG PO TABS
10.0000 mg | ORAL_TABLET | Freq: Every day | ORAL | Status: DC
  Administered 2013-08-17 – 2013-08-31 (×12): 10 mg via ORAL
  Filled 2013-08-17 (×15): qty 1

## 2013-08-17 MED ORDER — LIDOCAINE HCL (CARDIAC) 20 MG/ML IV SOLN
INTRAVENOUS | Status: AC
  Filled 2013-08-17: qty 5

## 2013-08-17 MED ORDER — ONDANSETRON HCL 4 MG/2ML IJ SOLN
INTRAMUSCULAR | Status: DC | PRN
  Administered 2013-08-17: 4 mg via INTRAVENOUS

## 2013-08-17 MED ORDER — ACETAMINOPHEN 650 MG RE SUPP: 650.0000 mg | RECTAL | Status: DC | PRN

## 2013-08-17 MED ORDER — FAMOTIDINE 10 MG PO TABS
10.0000 mg | ORAL_TABLET | Freq: Two times a day (BID) | ORAL | Status: DC
  Administered 2013-08-17 – 2013-08-31 (×26): 10 mg via ORAL
  Filled 2013-08-17 (×32): qty 1

## 2013-08-17 MED ORDER — CEFAZOLIN SODIUM-DEXTROSE 2-3 GM-% IV SOLR
2.0000 g | Freq: Three times a day (TID) | INTRAVENOUS | Status: AC
  Administered 2013-08-17 – 2013-08-18 (×2): 2 g via INTRAVENOUS
  Filled 2013-08-17 (×2): qty 50

## 2013-08-17 MED ORDER — DIPHENHYDRAMINE HCL (SLEEP) 25 MG PO TABS: 25.0000 mg | ORAL_TABLET | Freq: Four times a day (QID) | ORAL | Status: DC | PRN

## 2013-08-17 MED ORDER — DIAZEPAM 2 MG PO TABS: 2.0000 mg | ORAL_TABLET | Freq: Four times a day (QID) | ORAL | Status: DC | PRN

## 2013-08-17 MED ORDER — BACITRACIN ZINC 500 UNIT/GM EX OINT
TOPICAL_OINTMENT | CUTANEOUS | Status: DC | PRN
  Administered 2013-08-17: 1 via TOPICAL

## 2013-08-17 MED ORDER — BISOPROLOL FUMARATE 5 MG PO TABS
2.5000 mg | ORAL_TABLET | Freq: Once | ORAL | Status: AC
  Administered 2013-08-17: 2.5 mg via ORAL
  Filled 2013-08-17: qty 0.5

## 2013-08-17 MED ORDER — ROCURONIUM BROMIDE 50 MG/5ML IV SOLN
INTRAVENOUS | Status: AC
  Filled 2013-08-17: qty 1

## 2013-08-17 MED ORDER — OXYCODONE HCL 5 MG/5ML PO SOLN: 5.0000 mg | Freq: Once | ORAL | Status: DC | PRN

## 2013-08-17 MED ORDER — BUPIVACAINE-EPINEPHRINE PF 0.5-1:200000 % IJ SOLN
INTRAMUSCULAR | Status: DC | PRN
  Administered 2013-08-17: 10 mL

## 2013-08-17 MED ORDER — PHENOL 1.4 % MT LIQD
1.0000 | OROMUCOSAL | Status: DC | PRN
  Filled 2013-08-17: qty 177

## 2013-08-17 MED ORDER — ACETAMINOPHEN 325 MG PO TABS
650.0000 mg | ORAL_TABLET | ORAL | Status: DC | PRN
  Administered 2013-08-20: 650 mg via ORAL
  Filled 2013-08-17: qty 2

## 2013-08-17 MED ORDER — HYDROCODONE-ACETAMINOPHEN 5-325 MG PO TABS
1.0000 | ORAL_TABLET | ORAL | Status: DC | PRN
  Administered 2013-08-19: 1 via ORAL
  Administered 2013-08-22 (×2): 2 via ORAL
  Filled 2013-08-17: qty 1
  Filled 2013-08-17 (×2): qty 2

## 2013-08-17 MED ORDER — ONDANSETRON HCL 4 MG/2ML IJ SOLN
INTRAMUSCULAR | Status: AC
  Filled 2013-08-17: qty 2

## 2013-08-17 MED ORDER — LOSARTAN POTASSIUM 25 MG PO TABS
12.5000 mg | ORAL_TABLET | Freq: Every day | ORAL | Status: DC
  Administered 2013-08-17 – 2013-08-31 (×13): 12.5 mg via ORAL
  Filled 2013-08-17 (×15): qty 0.5

## 2013-08-17 MED ORDER — ALUM & MAG HYDROXIDE-SIMETH 200-200-20 MG/5ML PO SUSP
30.0000 mL | Freq: Four times a day (QID) | ORAL | Status: DC | PRN
  Administered 2013-08-23: 30 mL via ORAL
  Filled 2013-08-17: qty 30

## 2013-08-17 MED ORDER — GLYCOPYRROLATE 0.2 MG/ML IJ SOLN
INTRAMUSCULAR | Status: AC
  Filled 2013-08-17: qty 2

## 2013-08-17 MED ORDER — DOCUSATE SODIUM 100 MG PO CAPS
100.0000 mg | ORAL_CAPSULE | Freq: Two times a day (BID) | ORAL | Status: DC
  Administered 2013-08-17 – 2013-08-31 (×27): 100 mg via ORAL
  Filled 2013-08-17 (×25): qty 1

## 2013-08-17 MED ORDER — OXYCODONE HCL 5 MG PO TABS: 5.0000 mg | ORAL_TABLET | Freq: Once | ORAL | Status: DC | PRN

## 2013-08-17 MED ORDER — CEFAZOLIN SODIUM-DEXTROSE 2-3 GM-% IV SOLR
INTRAVENOUS | Status: AC
  Administered 2013-08-17: 2 g via INTRAVENOUS
  Filled 2013-08-17: qty 50

## 2013-08-17 MED ORDER — METOCLOPRAMIDE HCL 5 MG/ML IJ SOLN: 10.0000 mg | Freq: Once | INTRAMUSCULAR | Status: DC | PRN

## 2013-08-17 MED ORDER — HYDROMORPHONE HCL PF 1 MG/ML IJ SOLN: 0.2500 mg | INTRAMUSCULAR | Status: DC | PRN

## 2013-08-17 MED ORDER — PHENYLEPHRINE HCL 10 MG/ML IJ SOLN
INTRAMUSCULAR | Status: DC | PRN
  Administered 2013-08-17: 120 ug via INTRAVENOUS
  Administered 2013-08-17: 40 ug via INTRAVENOUS
  Administered 2013-08-17: 120 ug via INTRAVENOUS
  Administered 2013-08-17: 80 ug via INTRAVENOUS
  Administered 2013-08-17: 40 ug via INTRAVENOUS

## 2013-08-17 MED ORDER — LACTATED RINGERS IV SOLN
INTRAVENOUS | Status: DC
  Administered 2013-08-17: 10:00:00 via INTRAVENOUS

## 2013-08-17 MED ORDER — ROCURONIUM BROMIDE 100 MG/10ML IV SOLN
INTRAVENOUS | Status: DC | PRN
  Administered 2013-08-17: 10 mg via INTRAVENOUS
  Administered 2013-08-17: 50 mg via INTRAVENOUS

## 2013-08-17 MED ORDER — LACTATED RINGERS IV SOLN
INTRAVENOUS | Status: DC | PRN
  Administered 2013-08-17 (×2): via INTRAVENOUS

## 2013-08-17 MED ORDER — ARTIFICIAL TEARS OP OINT
TOPICAL_OINTMENT | OPHTHALMIC | Status: DC | PRN
  Administered 2013-08-17: 1 via OPHTHALMIC

## 2013-08-17 MED ORDER — LACTATED RINGERS IV SOLN
INTRAVENOUS | Status: DC
  Administered 2013-08-17: 75 mL/h via INTRAVENOUS

## 2013-08-17 SURGICAL SUPPLY — 62 items
APL SKNCLS STERI-STRIP NONHPOA (GAUZE/BANDAGES/DRESSINGS) ×1
BAG DECANTER FOR FLEXI CONT (MISCELLANEOUS) ×3 IMPLANT
BENZOIN TINCTURE PRP APPL 2/3 (GAUZE/BANDAGES/DRESSINGS) ×3 IMPLANT
BLADE 10 SAFETY STRL DISP (BLADE) ×6 IMPLANT
BLADE SURG ROTATE 9660 (MISCELLANEOUS) IMPLANT
BRUSH SCRUB EZ PLAIN DRY (MISCELLANEOUS) ×3 IMPLANT
BUR ACORN 6.0 (BURR) ×2 IMPLANT
BUR ACORN 6.0MM (BURR) ×1
BUR MATCHSTICK NEURO 3.0 LAGG (BURR) ×3 IMPLANT
CANISTER SUCT 3000ML (MISCELLANEOUS) ×3 IMPLANT
CLOSURE WOUND 1/2 X4 (GAUZE/BANDAGES/DRESSINGS) ×1
CONT SPEC 4OZ CLIKSEAL STRL BL (MISCELLANEOUS) ×3 IMPLANT
DRAPE LAPAROTOMY 100X72X124 (DRAPES) ×3 IMPLANT
DRAPE MICROSCOPE LEICA (MISCELLANEOUS) ×3 IMPLANT
DRAPE POUCH INSTRU U-SHP 10X18 (DRAPES) ×3 IMPLANT
DRAPE SURG 17X23 STRL (DRAPES) ×12 IMPLANT
DURASEAL SPINE SEALANT 3ML (MISCELLANEOUS) ×2 IMPLANT
ELECT BLADE 4.0 EZ CLEAN MEGAD (MISCELLANEOUS) ×3
ELECT REM PT RETURN 9FT ADLT (ELECTROSURGICAL) ×3
ELECTRODE BLDE 4.0 EZ CLN MEGD (MISCELLANEOUS) ×1 IMPLANT
ELECTRODE REM PT RTRN 9FT ADLT (ELECTROSURGICAL) ×1 IMPLANT
GAUZE SPONGE 4X4 16PLY XRAY LF (GAUZE/BANDAGES/DRESSINGS) IMPLANT
GLOVE BIO SURGEON STRL SZ8.5 (GLOVE) ×3 IMPLANT
GLOVE BIOGEL PI IND STRL 7.5 (GLOVE) ×3 IMPLANT
GLOVE BIOGEL PI INDICATOR 7.5 (GLOVE) ×6
GLOVE ECLIPSE 7.0 STRL STRAW (GLOVE) ×4 IMPLANT
GLOVE ECLIPSE 7.5 STRL STRAW (GLOVE) ×6 IMPLANT
GLOVE EXAM NITRILE LRG STRL (GLOVE) IMPLANT
GLOVE EXAM NITRILE MD LF STRL (GLOVE) ×4 IMPLANT
GLOVE EXAM NITRILE XL STR (GLOVE) IMPLANT
GLOVE EXAM NITRILE XS STR PU (GLOVE) IMPLANT
GLOVE SS BIOGEL STRL SZ 8 (GLOVE) ×1 IMPLANT
GLOVE SUPERSENSE BIOGEL SZ 8 (GLOVE) ×2
GOWN BRE IMP SLV AUR LG STRL (GOWN DISPOSABLE) IMPLANT
GOWN BRE IMP SLV AUR XL STRL (GOWN DISPOSABLE) ×11 IMPLANT
GOWN STRL REIN 2XL LVL4 (GOWN DISPOSABLE) IMPLANT
HEMOSTAT POWDER SURGIFOAM 1G (HEMOSTASIS) ×2 IMPLANT
KIT BASIN OR (CUSTOM PROCEDURE TRAY) ×3 IMPLANT
KIT ROOM TURNOVER OR (KITS) ×3 IMPLANT
NDL HYPO 21X1.5 SAFETY (NEEDLE) IMPLANT
NEEDLE HYPO 21X1.5 SAFETY (NEEDLE) ×3 IMPLANT
NEEDLE HYPO 22GX1.5 SAFETY (NEEDLE) ×3 IMPLANT
NS IRRIG 1000ML POUR BTL (IV SOLUTION) ×3 IMPLANT
PACK LAMINECTOMY NEURO (CUSTOM PROCEDURE TRAY) ×3 IMPLANT
PAD ARMBOARD 7.5X6 YLW CONV (MISCELLANEOUS) ×13 IMPLANT
PATTIES SURGICAL .5 X1 (DISPOSABLE) ×2 IMPLANT
PATTIES SURGICAL 1X1 (DISPOSABLE) ×3 IMPLANT
RUBBERBAND STERILE (MISCELLANEOUS) ×6 IMPLANT
SPONGE GAUZE 4X4 12PLY (GAUZE/BANDAGES/DRESSINGS) ×3 IMPLANT
SPONGE SURGIFOAM ABS GEL 100 (HEMOSTASIS) ×3 IMPLANT
SPONGE SURGIFOAM ABS GEL SZ50 (HEMOSTASIS) ×2 IMPLANT
STRIP CLOSURE SKIN 1/2X4 (GAUZE/BANDAGES/DRESSINGS) ×2 IMPLANT
SUT PROLENE 6 0 BV (SUTURE) ×4 IMPLANT
SUT VIC AB 1 CT1 18XBRD ANBCTR (SUTURE) ×2 IMPLANT
SUT VIC AB 1 CT1 8-18 (SUTURE) ×6
SUT VIC AB 2-0 CP2 18 (SUTURE) ×6 IMPLANT
SYR 20CC LL (SYRINGE) ×2 IMPLANT
SYR 20ML ECCENTRIC (SYRINGE) ×3 IMPLANT
TAPE CLOTH SURG 4X10 WHT LF (GAUZE/BANDAGES/DRESSINGS) ×3 IMPLANT
TOWEL OR 17X24 6PK STRL BLUE (TOWEL DISPOSABLE) ×3 IMPLANT
TOWEL OR 17X26 10 PK STRL BLUE (TOWEL DISPOSABLE) ×3 IMPLANT
WATER STERILE IRR 1000ML POUR (IV SOLUTION) ×3 IMPLANT

## 2013-08-17 NOTE — Transfer of Care (Signed)
Immediate Anesthesia Transfer of Care Note  Patient: Katherine Rubio  Procedure(s) Performed: Procedure(s): LUMBAR LAMINECTOMY/DECOMPRESSION MICRODISCECTOMY 4 LEVEL,LUMBAR ONE-TWO,LUMBAR TWO-THREE,LUMBAR THREE-FOUR,LUMBAR FOUR-FIVE (N/A)  Patient Location: PACU  Anesthesia Type:General  Level of Consciousness: awake  Airway & Oxygen Therapy: Patient Spontanous Breathing and Patient connected to face mask oxygen  Post-op Assessment: Report given to PACU RN and Post -op Vital signs reviewed and stable  Post vital signs: Reviewed and stable  Complications: No apparent anesthesia complications

## 2013-08-17 NOTE — Progress Notes (Signed)
Patient rolled to left side notified family of skin findings they report that patient likes to scratch area. Patient educated on importance of not scratching area and verbalized understanding. Home health CNA reports that she will notify nursing staff once patient goes home as well. Home health agency "[facility]" [place], [place]

## 2013-08-17 NOTE — Anesthesia Postprocedure Evaluation (Signed)
Anesthesia Post Note  Patient: Katherine Rubio  Procedure(s) Performed: Procedure(s) (LRB): LUMBAR LAMINECTOMY/DECOMPRESSION MICRODISCECTOMY 4 LEVEL,LUMBAR ONE-TWO,LUMBAR TWO-THREE,LUMBAR THREE-FOUR,LUMBAR FOUR-FIVE (N/A)  Anesthesia type: General  Patient location: PACU  Post pain: Pain level controlled  Post assessment: Patient's Cardiovascular Status Stable  Last Vitals:  Filed Vitals:   08/17/13 1545  BP:   Pulse: 64  Temp:   Resp: 19    Post vital signs: Reviewed and stable  Level of consciousness: alert  Complications: No apparent anesthesia complications

## 2013-08-17 NOTE — H&P (Signed)
Subjective: The patient is an 80 year old white female who's had some chronic back troubles. She fell and suffered a thoracic fracture sometime ago. This was treated with the kyphoplasty she did well. More recently she's had increasing back pain which radiates into her legs, consistent with neurogenic claudication. She has failed medical management and was worked up with a lumbar MRI. This demonstrated severe spinal stenosis at L2-3, L3-4 and L4-5. I discussed the various treatment option with the patient and her family including surgery. She has weighed the risks, benefits, and alternatives surgery and decided proceed with a decompressive lumbar laminectomy.   Past Medical History  Diagnosis Date  . Depression   . Hyperlipidemia   . Hypertension   . Chronic low back pain   . PONV (postoperative nausea and vomiting)   . GERD (gastroesophageal reflux disease)   . Osteoarthritis     Past Surgical History  Procedure Laterality Date  . Back surgery    . Knee surgery      left knee replacement  . Eye surgery      BIL CATARACT  . Kyphoplasty  05/15/2012    Procedure: KYPHOPLASTY;  Surgeon: [name], MD;  Location: [facility];  Service: Neurosurgery;  Laterality: N/A;  Thoracic Six Kyphoplasty    No Known Allergies  History  Substance Use Topics  . Smoking status: Never Smoker   . Smokeless tobacco: Never Used  . Alcohol Use: No    Family History  Problem Relation Age of Onset  . Lung cancer Brother     deceased  . Stroke Father     deceased age 37  . Pulmonary fibrosis Son     deceased  . Osteoarthritis Brother    Prior to Admission medications   Medication Sig Start Date End Date Taking? Authorizing Provider  bisoprolol-hydrochlorothiazide (ZIAC) 2.5-6.25 MG per tablet TAKE 1 TABLET BY MOUTH DAILY 02/04/13  Yes Debbrah Alar, NP  Calcium Carbonate-Vitamin D (CALTRATE 600+D) 600-400 MG-UNIT per tablet Take 1 tablet by mouth daily.    Yes Historical Provider, MD   diazepam (VALIUM) 2 MG tablet Take 2 mg by mouth every 6 (six) hours as needed for anxiety.   Yes Historical Provider, MD  losartan (COZAAR) 25 MG tablet Take 12.5 mg by mouth daily. Hold for sbp<100   Yes Historical Provider, MD  oxyCODONE-acetaminophen (PERCOCET) 5-325 MG per tablet Take 2 tablets by mouth every 6 (six) hours as needed for severe pain.    Yes Historical Provider, MD  pravastatin (PRAVACHOL) 20 MG tablet Take 1 tablet (20 mg total) by mouth at bedtime. 03/27/13  Yes Debbrah Alar, NP  Ranitidine HCl (ZANTAC PO) Take 1 tablet by mouth daily as needed. For heartburn.   Yes Historical Provider, MD  sertraline (ZOLOFT) 50 MG tablet Take 1 tablet (50 mg total) by mouth daily. 01/12/13  Yes Debbrah Alar, NP  alendronate (FOSAMAX) 70 MG tablet Take 1 tablet (70 mg total) by mouth every 7 (seven) days. Take with a full glass of water on an empty stomach. 07/17/12   Debbrah Alar, NP  diphenhydrAMINE (SOMINEX) 25 MG tablet Take 25 mg by mouth every 6 (six) hours as needed for itching or sleep.    Historical Provider, MD  traMADol (ULTRAM) 50 MG tablet Take 1 tablet (50 mg total) by mouth every 12 (twelve) hours as needed. 07/09/13   Leeanne Rio, PA-C     Review of Systems  Positive ROS: As above  All other systems have been  reviewed and were otherwise negative with the exception of those mentioned in the HPI and as above.  Objective: Vital signs in last 24 hours: Temp:  [97.8 F (36.6 C)] 97.8 F (36.6 C) (04/27 0959) Pulse Rate:  [65] 65 (04/27 0959) Resp:  [18] 18 (04/27 0959) BP: (150)/(64) 150/64 mmHg (04/27 0959) SpO2:  [94 %] 94 % (04/27 0959) Weight:  [80.315 kg (177 lb 1 oz)] 80.315 kg (177 lb 1 oz) (04/27 1011)  General Appearance: Alert, cooperative, no distress, Head: Normocephalic, without obvious abnormality, atraumatic Eyes: PERRL, conjunctiva/corneas clear, EOM's intact,    Ears: Normal  Throat: Normal  Neck: Supple, symmetrical, trachea  midline, no adenopathy; thyroid: No enlargement/tenderness/nodules; no carotid bruit or JVD Back: Symmetric, no curvature, ROM normal, no CVA tenderness Lungs: Clear to auscultation bilaterally, respirations unlabored Heart: Regular rate and rhythm, no murmur, rub or gallop Abdomen: Soft, non-tender,, no masses, no organomegaly Extremities: Extremities normal, atraumatic, no cyanosis or edema Pulses: 2+ and symmetric all extremities Skin: Skin color, texture, turgor normal, no rashes or lesions  NEUROLOGIC:   Mental status: alert and oriented, no aphasia, good attention span, Fund of knowledge/ memory ok Motor Exam - grossly normal Sensory Exam - grossly normal Reflexes:  Coordination - grossly normal Gait - grossly normal Balance - grossly normal Cranial Nerves: I: smell Not tested  II: visual acuity  OS: Normal  OD: Normal   II: visual fields Full to confrontation  II: pupils Equal, round, reactive to light  III,VII: ptosis None  III,IV,VI: extraocular muscles  Full ROM  V: mastication Normal  V: facial light touch sensation  Normal  V,VII: corneal reflex  Present  VII: facial muscle function - upper  Normal  VII: facial muscle function - lower Normal  VIII: hearing Not tested  IX: soft palate elevation  Normal  IX,X: gag reflex Present  XI: trapezius strength  5/5  XI: sternocleidomastoid strength 5/5  XI: neck flexion strength  5/5  XII: tongue strength  Normal    Data Review Lab Results  Component Value Date   WBC 7.8 08/17/2013   HGB 13.0 08/17/2013   HCT 38.4 08/17/2013   MCV 95.0 08/17/2013   PLT 260 08/17/2013   Lab Results  Component Value Date   NA 138 08/17/2013   K 4.1 08/17/2013   CL 102 08/17/2013   CO2 23 08/17/2013   BUN 26* 08/17/2013   CREATININE 0.89 08/17/2013   GLUCOSE 89 08/17/2013   Lab Results  Component Value Date   INR 1.00 01/02/2011    Assessment/Plan: L1-2, L2-3, L3-4, L4-5 spinal stenosis, lumbago, lumbar radiculopathy, neurogenic  claudication: I discussed situation with the patient and her family. I reviewed her MR scan with them and pointed out the abnormalities. We have discussed the various treatment options including surgery. I described the surgical treatment option of an L2, L3, L4 laminectomy with L1 laminotomies. I've described the surgery to them. I've shown her surgical models. We have discussed the risks, benefits, alternatives, and likelihood of achieving our goals with surgery. I have answered all the patient's, and her family's, questions. She has decided to proceed with surgery.   [name] 08/17/2013 11:59 AM

## 2013-08-17 NOTE — Anesthesia Procedure Notes (Addendum)
Procedure Name: Intubation Date/Time: 08/17/2013 12:42 PM Performed by: Maeola Harman Pre-anesthesia Checklist: Patient identified, Emergency Drugs available, Suction available, Patient being monitored and Timeout performed Patient Re-evaluated:Patient Re-evaluated prior to inductionOxygen Delivery Method: Circle system utilized Preoxygenation: Pre-oxygenation with 100% oxygen Intubation Type: IV induction Ventilation: Mask ventilation without difficulty Laryngoscope Size: Mac and 3 Grade View: Grade I Tube type: Oral Tube size: 7.0 mm Number of attempts: 1 Airway Equipment and Method: Stylet Placement Confirmation: ETT inserted through vocal cords under direct vision,  positive ETCO2 and breath sounds checked- equal and bilateral Secured at: 21 cm Tube secured with: Tape Dental Injury: Teeth and Oropharynx as per pre-operative assessment  Comments: Easy atraumatic induction and intubation with MAC 3 blade.  Dr. Albertina Parr verified placement.  Katherine Session, CRNA

## 2013-08-17 NOTE — Progress Notes (Signed)
Patient noted to have a Stage I going on Stage II pressure ulcer on sacrum.

## 2013-08-17 NOTE — Progress Notes (Signed)
Subjective:  The patient is somnolent but arousable. She is in no apparent distress.  Objective: Vital signs in last 24 hours: Temp:  [97.8 F (36.6 C)] 97.8 F (36.6 C) (04/27 0959) Pulse Rate:  [65] 65 (04/27 0959) Resp:  [18] 18 (04/27 0959) BP: (150)/(64) 150/64 mmHg (04/27 0959) SpO2:  [94 %] 94 % (04/27 0959) Weight:  [80.315 kg (177 lb 1 oz)] 80.315 kg (177 lb 1 oz) (04/27 1011)  Intake/Output from previous day:   Intake/Output this shift: Total I/O In: 1000 [I.V.:1000] Out: 600 [Urine:350; Blood:250]  Physical exam patient is very somnolent but she is moving all 4 extremities well.  Lab Results:  Recent Labs  08/17/13 1023  WBC 7.8  HGB 13.0  HCT 38.4  PLT 260   BMET  Recent Labs  08/17/13 1023  NA 138  K 4.1  CL 102  CO2 23  GLUCOSE 89  BUN 26*  CREATININE 0.89  CALCIUM 8.9    Studies/Results: Dg Chest 1 View  08/17/2013   CLINICAL DATA:  Hypertension.  EXAM: CHEST - 1 VIEW  COMPARISON:  Chest x-ray 04/18/2012.  FINDINGS: Mediastinum hilar structures are normal. Mild left base subsegmental atelectasis versus infiltrate noted. Heart size and pulmonary vascularity normal. No pleural effusion or pneumothorax. No acute bony abnormality.  IMPRESSION: Mild left base subsegmental atelectasis versus infiltrate.   Electronically Signed   By: Marcello Moores  Register   On: 08/17/2013 10:39   Dg Lumbar Spine 1 View  08/17/2013   CLINICAL DATA:  Laminectomy  EXAM: LUMBAR SPINE - 1 VIEW  COMPARISON:  07/25/2013  FINDINGS: Portable intraoperative radiograph of the lumbar spine was obtained for probe localization. Tissue spreaders are posterior to the L4 and L5 vertebra. There is a surgical probe which is directed towards the L4-5 disc space.  IMPRESSION: 1. Surgical probe localization of the L4-5 disc space.   Electronically Signed   By: Kerby Moors M.D.   On: 08/17/2013 15:17    Assessment/Plan: The patient is doing well.  LOS: 0 days     [name] 08/17/2013, 3:36 PM

## 2013-08-17 NOTE — Op Note (Signed)
Brief history: The patient is an 80 year old white female who's had trouble with her back for years. He's had prior lumbar surgery and a prior  thoracic kyphoplasty. He has developed grossly worsening back and radicular leg pain consistent with neurogenic claudication. She has failed medical management and was worked up with a lumbar MRI. This demonstrated severe spinal stenosis at L2-3, L3-4 and L4-5 with more moderate stenosis at L1-2. I discussed the various treatment options with the patient including surgery. She has weighed the risks, benefits, and alternatives surgery decided proceed with a decompressive lumbar laminectomy.  Preoperative diagnosis: L1-2, L2-3, L3-4 and L4-5 spinal stenosis, lumbago, lumbar radiculopathy, neurogenic claudication  Postoperative diagnosis: The same  Procedure: L2, L3 and L4 laminectomy with bilateral L1 laminotomies to decompress the bilateral L2, L3, L4 and L5 nerve roots using micro-dissection  Surgeon: Dr. Earle Gell  Asst.: Dr. Dominica Severin cram  Anesthesia: Gen. endotracheal  Estimated blood loss: 300 cc  Drains: None  Complications: None  Description of procedure: The patient was brought to the operating room by the anesthesia team. General endotracheal anesthesia was induced. The patient was turned to the prone position on the Wilson frame. The patient's lumbosacral region was then prepared with Betadine scrub and Betadine solution. Sterile drapes were applied.  I then injected the area to be incised with Marcaine with epinephrine solution. I then used a scalpel to make a linear midline incision over the L1-2, L2-3, L3-4 and L4-5 intervertebral disc space. I then used electrocautery to perform a bilateral  subperiosteal dissection exposing the spinous process and lamina of L1 to the upper sacrum. We obtained intraoperative radiograph to confirm our location. I then inserted the versa track retractor for exposure. I then incised the interspinous ligament at  L1-2, L2-3, L3-4 and L4-5 with a scalpel. I then used a Leksell nodule were to remove the spinous process of L2, L3, and L4.  We then brought the operative microscope into the field. Under its magnification and illumination we completed the microdissection. I used a high-speed drill to perform bilateral laminotomies at L1, L2, L3 and L4. We then used a Kerrison punches to complete the laminectomy at L2, L3 and L4. I then used a Kerrison punches to widen the laminotomies at L1 and removed the ligamentum flavum at L1-2, L2-3, L3-4 and L4-5. We then used microdissection to free up the thecal sac and the bilateral L2, L3, L4 and L5 nerve root from the epidural tissue. I then used a Kerrison punch to perform a foraminotomy at about the bilateral L2, L3, L4 and L5 nerve root. We did encounter a durotomy which we closed with a running 6-0 Prolene suture. We inspected the intervertebral disc at L1-2, L2-3, L3-4 and L4-5. There were no significant herniations. We did not perform a discectomy.  I then palpated along the ventral surface of the thecal sac and along exit route of the bilateral L2, L3, L4 and L5 nerve root and noted that the neural structures were well decompressed. This completed the decompression.  We then obtained hemostasis using bipolar electrocautery. We irrigated the wound out with bacitracin solution. I placed a DuraSeal over the durotomy. We then removed the retractor. We then reapproximated the patient's thoracolumbar fascia with interrupted #1 Vicryl suture. We then reapproximated the patient's subcutaneous tissue with interrupted 2-0 Vicryl suture. We then reapproximated patient's skin with Steri-Strips and benzoin. The was then coated with bacitracin ointment. The drapes were removed. The patient was subsequently returned to the supine position where  they were extubated by the anesthesia team. The patient was then transported to the postanesthesia care unit in stable condition. All sponge  instrument and needle counts were reportedly correct at the end of this case.

## 2013-08-17 NOTE — Anesthesia Preprocedure Evaluation (Addendum)
Anesthesia Evaluation  Patient identified by MRN, date of birth, ID band Patient awake    Reviewed: Allergy & Precautions, H&P , NPO status , Patient's Chart, lab work & pertinent test results, reviewed documented beta blocker date and time   History of Anesthesia Complications (+) PONV and history of anesthetic complications  Airway Mallampati: II TM Distance: >3 FB Neck ROM: full    Dental   Pulmonary neg pulmonary ROS,  breath sounds clear to auscultation        Cardiovascular hypertension, On Medications and On Home Beta Blockers Rhythm:regular     Neuro/Psych PSYCHIATRIC DISORDERS Depression  Neuromuscular disease    GI/Hepatic Neg liver ROS, GERD-  Medicated and Controlled,  Endo/Other  negative endocrine ROS  Renal/GU negative Renal ROS  negative genitourinary   Musculoskeletal negative musculoskeletal ROS (+)   Abdominal   Peds  Hematology negative hematology ROS (+)   Anesthesia Other Findings See surgeon's H&P   Reproductive/Obstetrics negative OB ROS                          Anesthesia Physical Anesthesia Plan  ASA: II  Anesthesia Plan: General   Post-op Pain Management:    Induction: Intravenous  Airway Management Planned: Oral ETT  Additional Equipment:   Intra-op Plan:   Post-operative Plan: Extubation in OR  Informed Consent: I have reviewed the patients History and Physical, chart, labs and discussed the procedure including the risks, benefits and alternatives for the proposed anesthesia with the patient or authorized representative who has indicated his/her understanding and acceptance.   Dental Advisory Given  Plan Discussed with: CRNA and Surgeon  Anesthesia Plan Comments:         Anesthesia Quick Evaluation

## 2013-08-18 ENCOUNTER — Encounter (HOSPITAL_COMMUNITY): Payer: Self-pay | Admitting: Neurosurgery

## 2013-08-18 LAB — GLUCOSE, CAPILLARY: GLUCOSE-CAPILLARY: 159 mg/dL — AB (ref 70–99)

## 2013-08-18 LAB — CBC
HCT: 31.5 % — ABNORMAL LOW (ref 36.0–46.0)
Hemoglobin: 10.5 g/dL — ABNORMAL LOW (ref 12.0–15.0)
MCH: 31.4 pg (ref 26.0–34.0)
MCHC: 33.3 g/dL (ref 30.0–36.0)
MCV: 94.3 fL (ref 78.0–100.0)
PLATELETS: 229 10*3/uL (ref 150–400)
RBC: 3.34 MIL/uL — AB (ref 3.87–5.11)
RDW: 13.1 % (ref 11.5–15.5)
WBC: 9 10*3/uL (ref 4.0–10.5)

## 2013-08-18 LAB — BASIC METABOLIC PANEL
BUN: 20 mg/dL (ref 6–23)
CHLORIDE: 105 meq/L (ref 96–112)
CO2: 24 mEq/L (ref 19–32)
Calcium: 8.2 mg/dL — ABNORMAL LOW (ref 8.4–10.5)
Creatinine, Ser: 0.72 mg/dL (ref 0.50–1.10)
GFR calc non Af Amer: 74 mL/min — ABNORMAL LOW (ref >90)
GFR, EST AFRICAN AMERICAN: 86 mL/min — AB (ref >90)
Glucose, Bld: 150 mg/dL — ABNORMAL HIGH (ref 70–99)
Potassium: 4.1 mEq/L (ref 3.7–5.3)
SODIUM: 139 meq/L (ref 137–147)

## 2013-08-18 MED ORDER — BISACODYL 10 MG RE SUPP
10.0000 mg | Freq: Every day | RECTAL | Status: DC | PRN
  Administered 2013-08-20 – 2013-08-29 (×3): 10 mg via RECTAL
  Filled 2013-08-18 (×3): qty 1

## 2013-08-18 MED ORDER — FLEET ENEMA 7-19 GM/118ML RE ENEM
1.0000 | ENEMA | Freq: Every day | RECTAL | Status: DC | PRN
  Administered 2013-08-23: 1 via RECTAL
  Filled 2013-08-18 (×2): qty 1

## 2013-08-18 MED ORDER — NYSTATIN 100000 UNIT/GM EX POWD
Freq: Four times a day (QID) | CUTANEOUS | Status: DC
  Administered 2013-08-18 – 2013-08-21 (×12): via TOPICAL
  Administered 2013-08-21: 1 g via TOPICAL
  Administered 2013-08-22 – 2013-08-31 (×37): via TOPICAL
  Filled 2013-08-18 (×2): qty 15

## 2013-08-18 MED ORDER — SODIUM CHLORIDE 0.9 % IV SOLN
INTRAVENOUS | Status: DC
  Administered 2013-08-18: 10:00:00 via INTRAVENOUS
  Administered 2013-08-18: 1000 mL via INTRAVENOUS
  Administered 2013-08-19 – 2013-08-20 (×2): via INTRAVENOUS

## 2013-08-18 NOTE — Care Management Note (Addendum)
Page 1 of 2   09/01/2013     10:49:15 AM CARE MANAGEMENT NOTE 09/01/2013  Patient:  Katherine Rubio, Katherine Rubio   Account Number:  [id number]  Date Initiated:  08/18/2013  Documentation initiated by:  Elissa Hefty  Subjective/Objective Assessment:   adm w back surg.     Action/Plan:   lives alone and has aid that assists, pcp dr Lewayne Bunting   Anticipated DC Date:  08/31/2013   Anticipated DC Plan:  [place]  CM consult      Choice offered to / List presented to:  C-2 HC POA / Guardian   DME arranged  [place]      DME agency  [place] arranged  [facility]      [facility] agency  [place]   Status of service:  Completed, signed off Medicare Important Message given?  YES (If response is "NO", the following Medicare IM given date fields will be blank) Date Medicare IM given:  08/21/2013 Date Additional Medicare IM given:  08/27/2013  Discharge Disposition:  [place]  Per UR Regulation:  Reviewed for med. necessity/level of care/duration of stay  If discussed at [place] of Stay Meetings, dates discussed:    Comments:  09/01/13 [place] RN, MSN, CM- CM spoke with [facility] DME to notify him that patient's POA Katherine Rubio is requesting a phone call to discuss the delivery issues. Phone number was provided.   08/31/13 [place], MSN, CM- Recieved call from patient's POA stating that all equipment was in place and patient had arrived at home.   08/31/13 2015 [name] RN, MSN, CM- Equipment had not yet been delivered.  POA recieved a call at 2015 stating that delivery would take place by approximately 2045.  POA asked that CM have nursing staff arrange for transportation pick up at 2100.  CM called 4N charge RN to notify of need to call [facility] for 2100 pick-up  time.   08/31/13 [place], MSN, CM- CM recieved a call from [facility] stating that he has still not heard anything. CM and POA both contacted the afterhours number and were assured that someone would call POA as soon as possible.   08/31/13 [place], MSN, CM- REcieved call from [facility] stating that he is concerned that the equipment may not get there in time for patient to discharge home, as he has had no updated from the delivery driver.  CM assured POA that patient can still be discharged late in the evening.  CM called [place] DME afterhours number to verify that equipment delivery was still taking place.  CM requested that [facility] DME contact family to update on estimated delivery time.  CM was assured that driver would be contacting POA at the phone number provded by CM. CM called POA to tell him to expect a call.  08/31/13 [place], MSN, CM- Recieved a call from [facility] stating that delivery is scheduled between 1600 and 2000.  08/31/13 [place], MSN, CM- Recieved call from [facility] stating that he has still not recieved a call regarding delivery time.  CM called [place] DME liason, who will be addressing the issue.   08/31/13  [place],  MSN, CM- Spoke with [name] home care regarding discharge plans for today.  Discharge summary and face-to-face were faxed as requested.  Spoke with patient's POA, who states that equipment had not yet been delievered and [facility] DME had never contacted to discuss delivery.  CM called [place] DME liason, who refaxed the orders.  Family is awaiting a call back regarding delivery time.     09/01/2013 0920 dc instruction faxed to [facility] on 08/31/2013. Jonnie Finner RN CCM Case Mgmt phone [phone]  08/27/13 [place] RN, MSN, CM- Met with Katherine Rubio, patient's nephew/POA to introduce self and discuss plan of care.  CM provided contact number and encouraged POA to call with any additional  questions/concerns.  New Medicare IM was completed and placed on the chart.  Juliann Pulse with [facility] was updated on patient's status.   08/27/13 [place], MSN, CM- After discussion with PT regarding patient's poor progress with mobilization, CM spoke with patient's niece Katherine Rubio to verify that family was still intending to take patient home at discharge.  Per Ms Owens Rubio, patient will be going home as previously arranged regardless of her mobility status.  CM updated CSW that SNF search will not be necessary.  Ms Owens Rubio is requesting a hoyer lift to be added to home DME orders.  Family has concerns regarding patient's decline in mental status and would like to know if Dr Arnoldo Morale will do any additional work-up.  Patient's RN was updated regarding this concern/request.   08/21/13 [place], MSN, CM- Recieved verbal orders from Dr Arnoldo Morale for home health. Spoke with [name], who has accepted the referral for an anticipated Monday discharge. DME orders were placed. Dr Arnoldo Morale will complete face-to-face prior to discharge. Katherine Rubio was notified and is in agreement with plans. CM will continue to follow.    08/21/13 [place], MSN, CM- Met with patient, who had private CNA at bedside.  CM confirmed that patient's plan is to go home at discharge rather than the recommended SNF.  Patient stated that because she was without her hearing aides, she would prefer that information be shared with her niece  Katherine Rubio [phone], who is also the owner of the private duty agency [employer]  who currently provides her with 24hr care.  CM left a copy of the Medicare IM letter at bedside, which patient would prefer be signed by Ms Owens Rubio.  CM called Ms Owens Rubio, who agreed that they would need the Rubio bed and wheelchair.  She declined the 3N1, as they have similar equipment in place already.  CM explained that any DME or HH orders would have to be approved/placed  by Dr Arnoldo Morale before arrangements could be made.  IF Dr Arnoldo Morale orders DME and/or home health, arrangments will be made.  Ms Owens Rubio has requested that any home health services be provided by [facility].  CM verbally went over the Medicare IM letter with Ms Owens Rubio, who states that she will sign the copy at bedside during her next visit.  CM spoke with Dr Arnoldo Morale' secretary Jacqlyn Larsen, who will relay the message and notify CM if any HH/DME orders will be placed.  CM will continue to follow.

## 2013-08-18 NOTE — Progress Notes (Signed)
Patient ID: Katherine Rubio, female   DOB: 1922/04/24, 80 y.o.   MRN: [id number] Subjective:  the patient is alert and pleasant. She looks well. She is in no apparent distress. She denies headaches.  Objective: Vital signs in last 24 hours: Temp:  [97.4 F (36.3 C)-98.4 F (36.9 C)] 98.2 F (36.8 C) (04/28 0351) Pulse Rate:  [55-76] 55 (04/28 0600) Resp:  [13-70] 13 (04/28 0600) BP: (86-151)/(36-101) 96/36 mmHg (04/28 0600) SpO2:  [91 %-100 %] 99 % (04/28 0600) Weight:  [80.315 kg (177 lb 1 oz)-82.4 kg (181 lb 10.5 oz)] 82.4 kg (181 lb 10.5 oz) (04/28 0604)  Intake/Output from previous day: 04/27 0701 - 04/28 0700 In: 2675 [I.V.:2625; IV Piggyback:50] Out: 1200 [Urine:950; Blood:250] Intake/Output this shift:    Physical exam patient is alert and oriented x3 she is moving her lower extremities well. Her dressing is clean and dry.  Lab Results:  Recent Labs  08/17/13 1023 08/18/13 0232  WBC 7.8 9.0  HGB 13.0 10.5*  HCT 38.4 31.5*  PLT 260 229   BMET  Recent Labs  08/17/13 1023 08/18/13 0232  NA 138 139  K 4.1 4.1  CL 102 105  CO2 23 24  GLUCOSE 89 150*  BUN 26* 20  CREATININE 0.89 0.72  CALCIUM 8.9 8.2*    Studies/Results: Dg Chest 1 View  08/17/2013   CLINICAL DATA:  Hypertension.  EXAM: CHEST - 1 VIEW  COMPARISON:  Chest x-ray 04/18/2012.  FINDINGS: Mediastinum hilar structures are normal. Mild left base subsegmental atelectasis versus infiltrate noted. Heart size and pulmonary vascularity normal. No pleural effusion or pneumothorax. No acute bony abnormality.  IMPRESSION: Mild left base subsegmental atelectasis versus infiltrate.   Electronically Signed   By: Marcello Moores  Register   On: 08/17/2013 10:39   Dg Lumbar Spine 1 View  08/17/2013   CLINICAL DATA:  Laminectomy  EXAM: LUMBAR SPINE - 1 VIEW  COMPARISON:  07/25/2013  FINDINGS: Portable intraoperative radiograph of the lumbar spine was obtained for probe localization. Tissue spreaders are posterior to the  L4 and L5 vertebra. There is a surgical probe which is directed towards the L4-5 disc space.  IMPRESSION: 1. Surgical probe localization of the L4-5 disc space.   Electronically Signed   By: Kerby Moors M.D.   On: 08/17/2013 15:17    Assessment/Plan: Postop day 1: The patient is doing well. We'll keep her at bedrest with head of bed less than 20 until least tomorrow. I will transfer to the floor.   LOS: 1 day     [name] 08/18/2013, 7:38 AM

## 2013-08-18 NOTE — Progress Notes (Signed)
PT Cancellation Note  Patient Details Name: Katherine Rubio MRN: [id number] DOB: 11/17/1922   Cancelled Treatment:    Reason Eval/Treat Not Completed: Patient not medically ready (Pt on bedrest until at least tomorrow.)   [place] 08/18/2013, 8:54 AM  Suanne Marker PT [phone]

## 2013-08-18 NOTE — Clinical Documentation Improvement (Signed)
PLEASE NOTE IF PRESENT ON ADMISSION   Neurosurgery MD's  Possible Clinical Conditions?   Per nursing note patient has a "stage 1 going on to  stage 2 decubitus ulcer on sacrum" noted during preop   assessment. If this is an appropriate clinical diagnosis please document in notes.  Thank you  Stage  I  Pressure Ulcer   (reddening of the skin) Stage  II Pressure Ulcer  (blister open or unopened) Stage  III Pressure Ulcer (through all layers skin) Other Condition Cannot Clinically Determine   Skin Assessment: Nursing assessment "stage 1 going to a stage 2 of sacrum ulcer" nursing prog note 08/17/13  1012 am Treatment:Decubitus Ulcer protocal  initiated  Thank You, Ree Kida ,RN Clinical Documentation Specialist:  [facility]

## 2013-08-18 NOTE — Progress Notes (Signed)
Patient's foley was removed by patient. Patient is oriented to person,place, situation, time but has moments of confusion. Patient was able to void 154ml on bedpan. Will continue to reassess for urinary retention.   Lamar Sprinkles

## 2013-08-18 NOTE — Progress Notes (Signed)
Patient transferred from [facility]U. Patient  Alert and oriented x4. Patient has her daughter and a Field seismologist with her.

## 2013-08-18 NOTE — Progress Notes (Addendum)
Notified Dr. Arnoldo Morale of patient BP 78/40, HR 55,  BP increase 86/44, HR 59. Held her  Cozaar and Ziac this am. Restart IV fluids and discontinue tomorrow. Patient can still transfer per Dr. Arnoldo Morale.  Priscella Mann, RN

## 2013-08-18 NOTE — Progress Notes (Signed)
Patient's  Noted to have a rash under the breast and under abdominal folds and groins, MD's office called to see if patient can get something for it.

## 2013-08-19 MED ORDER — OXYCODONE HCL 5 MG PO TABS
10.0000 mg | ORAL_TABLET | ORAL | Status: DC | PRN
  Administered 2013-08-19 – 2013-08-24 (×15): 10 mg via ORAL
  Filled 2013-08-19 (×16): qty 2

## 2013-08-19 NOTE — Progress Notes (Signed)
Pt's pain is quiet under control but calls for prn  Pain medications almost around  The clock pt had a lumbar corsett available but did not want to be mobilized .may try to get pt oob tomorrow. All needed care were rendered pt quiet in bed at present  .

## 2013-08-19 NOTE — Progress Notes (Signed)
AC called regarding pts pain and pain med administration.  Pt has private sitter that contacted her directly.  Pt has been given 3 administrations of 4 mg Morphine since 2300 last pm.  Pt RN notified to alert MD. MD paged.  Will follow up.

## 2013-08-19 NOTE — Progress Notes (Signed)
Patient ID: Katherine Rubio, female   DOB: 1922/09/09, 80 y.o.   MRN: [id number] Subjective:  Upon arriving in the room the patient was somnolent and resting comfortably. When she is woken up she complains of back pain.  Objective: Vital signs in last 24 hours: Temp:  [97.6 F (36.4 C)-99.8 F (37.7 C)] 99.8 F (37.7 C) (04/29 0949) Pulse Rate:  [61-85] 82 (04/29 0949) Resp:  [18] 18 (04/29 0949) BP: (87-142)/(42-63) 136/44 mmHg (04/29 0949) SpO2:  [92 %-100 %] 92 % (04/29 0949)  Intake/Output from previous day: 04/28 0701 - 04/29 0700 In: 2155 [P.O.:760; I.V.:1395] Out: 150 [Urine:150] Intake/Output this shift:    Physical exam the patient is alert and oriented x3. She is moving her lower extremities well. Her dressing is clean and dry.  Lab Results:  Recent Labs  08/17/13 1023 08/18/13 0232  WBC 7.8 9.0  HGB 13.0 10.5*  HCT 38.4 31.5*  PLT 260 229   BMET  Recent Labs  08/17/13 1023 08/18/13 0232  NA 138 139  K 4.1 4.1  CL 102 105  CO2 23 24  GLUCOSE 89 150*  BUN 26* 20  CREATININE 0.89 0.72  CALCIUM 8.9 8.2*    Studies/Results: Dg Lumbar Spine 1 View  08/17/2013   CLINICAL DATA:  Laminectomy  EXAM: LUMBAR SPINE - 1 VIEW  COMPARISON:  07/25/2013  FINDINGS: Portable intraoperative radiograph of the lumbar spine was obtained for probe localization. Tissue spreaders are posterior to the L4 and L5 vertebra. There is a surgical probe which is directed towards the L4-5 disc space.  IMPRESSION: 1. Surgical probe localization of the L4-5 disc space.   Electronically Signed   By: Kerby Moors M.D.   On: 08/17/2013 15:17    Assessment/Plan: Postop day #2: We will mobilize the patient. Pain management has been a bit of an issue. I've explained to the patient that there is no amount of pain medication that will take away all of her pain without creating unconsciousness. While the patient may have a bit of a high tolerance for pain medications because of  preoperative use, I am reluctant to give her much more pain medication as she is 80 years old and at times is somnolent and hypotensive. I will resume her Percocet which she took preoperatively.  LOS: 2 days     [name] 08/19/2013, 10:34 AM

## 2013-08-19 NOTE — Progress Notes (Signed)
PT Cancellation Note  Patient Details Name: SOHANA AUSTELL MRN: [id number] DOB: 12-31-1922   Cancelled Treatment:    Reason Eval/Treat Not Completed:  (checked x2, No back brace available at this time.)   Duncan Dull 08/19/2013, 2:10 PM Alben Deeds, [place] DPT  [phone]

## 2013-08-19 NOTE — Progress Notes (Signed)
OT Cancellation Note  Patient Details Name: Katherine Rubio MRN: [id number] DOB: 27-Feb-1923   Cancelled Treatment:    Reason Eval/Treat Not Completed: Other (comment) (Brace not in room when OT checked.)  Benito Mccreedy OTR/L [phone] 08/19/2013, 2:17 PM

## 2013-08-19 NOTE — Progress Notes (Signed)
Pt c/o severe pain rated over 120  Medicated with  4 mg of morphine IV at 0716 and  reposition for comfort  less 30 min the private duty CNA at pt bed side came out to RN that pt still in a lots of pain. Was told to let the iv medication take effect then will reassess and notify MD as needed .  Ice pack applied to the back and MD notified.Pt and sitter at bedside reassured Rn will continue to monitor .

## 2013-08-20 MED ORDER — HYDROCHLOROTHIAZIDE NICU ORAL SYRINGE 10 MG/ML
6.2500 mg | ORAL | Status: DC
  Administered 2013-08-20 – 2013-08-30 (×10): 6.25 mg via ORAL
  Filled 2013-08-20 (×13): qty 0.63

## 2013-08-20 MED ORDER — BISOPROLOL FUMARATE 5 MG PO TABS
2.5000 mg | ORAL_TABLET | Freq: Every day | ORAL | Status: DC
  Administered 2013-08-20 – 2013-08-30 (×9): 2.5 mg via ORAL
  Filled 2013-08-20 (×13): qty 0.5

## 2013-08-20 NOTE — Progress Notes (Signed)
Pt became very confused and restless ,uncooperative after PT therapy  in bed for 20 minute . Pt started yelling and crying unconsolable . Pain medication offered but pt refused and spilt it out post toiletting.Classical music played to pt by PT and pt finally calm down and slept for a while. Dinner offered but pt refused but took few sips of ensure drink. At 1800 pt became fully awake, cheerful and smiling. and took all pm medications . PO fluid encouraged ,few sips in short intervals. Peri care  completed and pt now resting conformable in bed. No acute distress at this time. Pt may need nutrition consults as po intake is poor.

## 2013-08-20 NOTE — Evaluation (Signed)
Occupational Therapy Evaluation Patient Details Name: Katherine Rubio MRN: [id number] DOB: July 30, 1922 Today's Date: 08/20/2013    History of Present Illness 80 year old white female who fell and suffered thoracic fractures in the past (no dates available). Pt presented with sever lumbar stenosis (segements L2-3, L3-4 and L4-5). Pt s/p L1-L5 laminectomies and decompression (08/17/13).    Clinical Impression   Pt admitted with above. She demonstrates the below listed deficits and will benefit from continued OT to maximize safety and independence with BADLs.    Pt confused during session.  She was able to move to EOB with mod A and sat EOB x ~20 mins with max encouragement and min - max A.  Pt unable to achieve standing with total A x 4 attempts.  Pt incontinent of urine, and when she was returned to sidelying confusion seemed to increase as she became agitated with all attempts to move her.  RN present and attempted to give pain meds, but pt became increasingly agitated with all attempts.  She currently, requires total A with all BADLs, and 2 person assist with all mobility.  She will likely require SNF level rehab at discharge as she would otherwise require 2 people assist 24 hours a day.      Follow Up Recommendations  SNF;Supervision/Assistance - 24 hour (+2 physical assistance)    Equipment Recommendations  3 in 1 bedside comode;Hospital bed;Wheelchair (measurements OT)    Recommendations for Other Services       Precautions / Restrictions Precautions Precautions: Fall;Back Precaution Comments: last fall 3 weeks ago per CNA; educated caregiver on back precautions;Pt unable to recall any precautions and unable to maintain them during session      Mobility Bed Mobility Overal bed mobility: Needs Assistance;+2 for physical assistance Bed Mobility: Rolling;Sidelying to Sit;Sit to Sidelying Rolling: Mod assist;+2 for physical assistance Sidelying to sit: Mod assist     Sit to  sidelying: Total assist General bed mobility comments: Pt initially required mod A for rolling, however, once she returned to bed appeare to be more confused and became agitated and combative with attempts to move her.  When moving sidelying to sitting, pt was able to assist with scooting LEs off bed and assisted with lifting her trunk.   Pt required total assist to return to sidelying  Transfers                 General transfer comment: Attempted to move sit to stand x 4.  Pt unable with total A.  She was able to lift bottom ~2" from bed before sitiing.  Pt attempts to grab and pull at therapist often pushing against therapist.      Balance Overall balance assessment: Needs assistance Sitting-balance support: Feet supported;Bilateral upper extremity supported Sitting balance-Leahy Scale: Poor Sitting balance - Comments: Pt sat EOB ~20 mins with min A - max A.  Pt grabbing onto therapist stating "hold me" "hold me" Postural control: Left lateral lean     Standing balance comment: unable                            ADL Overall ADL's : Needs assistance/impaired Eating/Feeding: Total assistance;Bed level   Grooming: Wash/dry hands;Wash/dry face;Brushing hair;Total assistance;Bed level;Sitting Grooming Details (indicate cue type and reason): Pt unable to engage in activity  Upper Body Bathing: Total assistance;Sitting;Bed level   Lower Body Bathing: Total assistance;Bed level   Upper Body Dressing : Total assistance;Bed level  Lower Body Dressing: Total assistance;Bed level   Toilet Transfer: Total assistance (unable)   Toileting- Clothing Manipulation and Hygiene: Total assistance;Bed level;+2 for physical assistance;Cueing for back precautions Toileting - Clothing Manipulation Details (indicate cue type and reason): Pt agitated and combative when attempting to log roll her to perform peri care and change bed linens.  Attempted to calm her with soothing voice,  reassurance, etc. but with little success     Functional mobility during ADLs: Maximal assistance;Total assistance (bed mobility ) General ADL Comments: Pt unable to engage in ADLs due to confusion      Vision                     Perception     Praxis      Pertinent Vitals/Pain 9-10/10 surgical back pain.  Repositioned, RN notified, relaxation, music provided to calm patient.      Hand Dominance     Extremity/Trunk Assessment Upper Extremity Assessment Upper Extremity Assessment: Generalized weakness (grossly assessed)   Lower Extremity Assessment Lower Extremity Assessment: Defer to PT evaluation   Cervical / Trunk Assessment Cervical / Trunk Assessment: Kyphotic   Communication Communication Communication: HOH   Cognition Arousal/Alertness: Lethargic;Suspect due to medications Behavior During Therapy: Anxious;Agitated Overall Cognitive Status: Impaired/Different from baseline Area of Impairment: Orientation;Attention;Memory;Following commands;Safety/judgement;Awareness;Problem solving Orientation Level: Disoriented to;Time Current Attention Level: Focused Memory: Decreased recall of precautions Following Commands: Follows one step commands inconsistently Safety/Judgement: Decreased awareness of safety;Decreased awareness of deficits   Problem Solving: Slow processing;Decreased initiation;Difficulty sequencing;Requires verbal cues;Requires tactile cues General Comments: Pt appears confused.  Demonstrates difficulty following commands.  Unable to answer most questions asked.  when pt returned to sidelying became agitated when attempts made to clean her up (incontinent of urine, move her, and when RN attempted to give her meds.    General Comments       Exercises       Shoulder Instructions      Home Living Family/patient expects to be discharged to:: Private residence Living Arrangements: Other (Comment) (24 hour care from CNA ) Available Help at  Discharge: Personal care attendant;Available 24 hours/day Type of Home: House Home Access: Stairs to enter [employer] of Steps: 1   Home Layout: One level         Biochemist, clinical: Standard     Home Equipment: Environmental consultant - 4 wheels;Shower seat (handicap toilet )          Prior Functioning/Environment Level of Independence: Needs assistance  Gait / Transfers Assistance Needed: assist for transfers ADL's / Homemaking Assistance Needed: Assist for bathing and dressing for past 6 weeks due to pain    Comments: Pt sponge bathes.  Caregivers report they have been with her for 6 weeks, and she has been progressively requiring more assistance due to pain.  The last week, pt was only transferring bed to recliner which was next to her bed.  Prior to 6 weeks ago, pt was living independently     OT Diagnosis: Generalized weakness;Cognitive deficits;Acute pain   OT Problem List: Decreased strength;Decreased activity tolerance;Impaired balance (sitting and/or standing);Decreased cognition;Decreased safety awareness;Decreased knowledge of use of DME or AE;Decreased knowledge of precautions;Obesity;Pain   OT Treatment/Interventions: Self-care/ADL training;DME and/or AE instruction;Therapeutic activities;Cognitive remediation/compensation;Patient/family education;Balance training    OT Goals(Current goals can be found in the care plan section) Acute Rehab OT Goals Patient Stated Goal: none stated OT Goal Formulation: Patient unable to participate in goal setting Time For Goal Achievement: 08/27/13 Potential to Achieve  Goals: Fair ADL Goals Pt Will Perform Grooming: with min assist;sitting Pt Will Transfer to Toilet: with mod assist;stand pivot transfer;bedside commode Pt Will Perform Toileting - Clothing Manipulation and hygiene: with max assist;sit to/from stand Additional ADL Goal #1: Pt will perform bed mobility with min A to assist with BADLs Additional ADL Goal #2: Pt will sit  EOB with supervision x 10 mins for assist with BADLs  OT Frequency: Min 2X/week   Barriers to D/C:    At present, pt will require +2 assist 24 hours a day.       Co-evaluation              End of Session Nurse Communication: Patient requests pain meds;Mobility status;Other (comment) (RN present throughout last portion of eval)  Activity Tolerance: Patient limited by pain;Other (comment);Patient limited by lethargy (confusion) Patient left:     Time: 7209-4709 OT Time Calculation (min): 64 min Charges:  OT General Charges $OT Visit: 1 Procedure OT Evaluation $Initial OT Evaluation Tier I: 1 Procedure OT Treatments $Therapeutic Activity: 53-67 mins G-Codes:    Sya Nestler M Kassidy Dockendorf Sep 13, 2013, 5:24 PM

## 2013-08-20 NOTE — Progress Notes (Signed)
Patient ID: Katherine Rubio, female   DOB: 11/23/1922, 80 y.o.   MRN: [id number] Subjective:  The patient is somnolent sleeping and resting comfortably. When she is awake to talk she complains of back pain. The patient has not been able to be ambulated yet.  Objective: Vital signs in last 24 hours: Temp:  [98 F (36.7 C)-99.4 F (37.4 C)] 98 F (36.7 C) (04/30 0945) Pulse Rate:  [77-79] 79 (04/30 0945) Resp:  [18] 18 (04/30 0945) BP: (106-138)/(66-77) 106/66 mmHg (04/30 0945) SpO2:  [94 %-98 %] 98 % (04/30 0945)  Intake/Output from previous day: 04/29 0701 - 04/30 0700 In: 1200 [P.O.:300; I.V.:900] Out: 550 [Urine:550] Intake/Output this shift: Total I/O In: 120 [P.O.:120] Out: -   Physical exam the patient is somnolent but easily arousable. Her strength is grossly normal in her lower extremities. Her dressing is clean and dry.  Lab Results:  Recent Labs  08/17/13 1023 08/18/13 0232  WBC 7.8 9.0  HGB 13.0 10.5*  HCT 38.4 31.5*  PLT 260 229   BMET  Recent Labs  08/17/13 1023 08/18/13 0232  NA 138 139  K 4.1 4.1  CL 102 105  CO2 23 24  GLUCOSE 89 150*  BUN 26* 20  CREATININE 0.89 0.72  CALCIUM 8.9 8.2*    Studies/Results: No results found.  Assessment/Plan: Postop day #3: I've spoken with the patient's nephew, [name], via the telephone. I have encouraged the patient to mobilize. I have again explained to her that back pain is to be expected 3 days after surgery. I've also explained that the slower she is to mobilize, the slower recovery, and the more prone to consultation such as pneumonia, blood clots, etc. We have also discussed the realistic expectations regarding the results of pain medications. I.e. they will not take all the pain away. We discussed inpatient rehabilitation but we are shooting for home PT OT.  LOS: 3 days     [name] 08/20/2013, 9:54 AM

## 2013-08-20 NOTE — Evaluation (Signed)
Physical Therapy Evaluation Patient Details Name: Katherine Rubio MRN: [id number] DOB: 1922/11/24 Today's Date: 08/20/2013   History of Present Illness  80 year old white female who fell and suffered thoracic fractures in the past (no dates available). Pt presented with sever lumbar stenosis (segements L2-3, L3-4 and L4-5). Pt s/p L1-L5 laminectomies and decompression (08/17/13).   Clinical Impression  Patient is s/p  Surgery listed above resulting in the deficits listed below (see PT Problem List). Patient will benefit from skilled PT to increase their independence and safety with mobility (while adhering to their precautions) to allow discharge to next appropriate venue. Pt greatly limited to EOB and bed mobility only during evaluation secondary to pain and lethargy. Pt demo poor carryover and knowledge of back precautions throughout session. At this time will recommend SNF for post acute rehab.      Follow Up Recommendations SNF;Supervision/Assistance - 24 hour    Equipment Recommendations  Other (comment) (TBD)    Recommendations for Other Services OT consult     Precautions / Restrictions Precautions Precautions: Fall;Back Precaution Comments: last fall 3 weeks ago per CNA; educated caregiver on back precautions; pt demo poor carryover with education  Restrictions Weight Bearing Restrictions: No      Mobility  Bed Mobility Overal bed mobility: +2 for physical assistance;Needs Assistance Bed Mobility: Rolling;Sidelying to Sit;Sit to Sidelying Rolling: Mod assist Sidelying to sit: +2 for physical assistance;Max assist     Sit to sidelying: +2 for physical assistance;Max assist General bed mobility comments: pt greatly limited due to pain ; requires incr time and max cues for sequencing and encouragement; 2 person (A) for sidelying <> sit to control LEs and trunk and adhere to back precautions; pt guarded and resistant against mobility; eyes closed throughout due to pain  per pt  Transfers Overall transfer level: Needs assistance (attempted sit to stand ) Equipment used: 2 person hand held assist Transfers: Sit to/from Stand Sit to Stand: Total assist;+2 physical assistance         General transfer comment: attempted sit to stand; unable to achieve full upright position due to pain; use of depenent transfer technique with 2 person (A) and use of draw pad  Ambulation/Gait                Stairs            Wheelchair Mobility    Modified Rankin (Stroke Patients Only)       Balance Overall balance assessment: Needs assistance;History of Falls Sitting-balance support: Feet supported;Bilateral upper extremity supported Sitting balance-Leahy Scale: Poor Sitting balance - Comments: pt reaching for bil UE support at all times in sitting position; leaning posteriorly and arching back; max cues for back precautions; tolerated sitting EOB ~8 min; required min to mod (A) to maintain upright posture at all times  Postural control: Posterior lean     Standing balance comment: unable to achieve standing position                              Pertinent Vitals/Pain C/o pain and tearful with all mobility; unable to rate pain     Home Living Family/patient expects to be discharged to:: Private residence Living Arrangements: Other (Comment) (24 hour care from CNA ) Available Help at Discharge: Personal care attendant;Available 24 hours/day Type of Home: House Home Access: Stairs to enter   [employer] of Steps: 1 Home Layout: One level Home Equipment: Walker - 4  wheels;Shower seat (handicap toilet )      Prior Function Level of Independence: Needs assistance   Gait / Transfers Assistance Needed: uses RW and CNA walks with her   ADL's / Homemaking Assistance Needed: total (A)   Comments: pt can feed herself      Hand Dominance        Extremity/Trunk Assessment   Upper Extremity Assessment: Defer to OT  evaluation           Lower Extremity Assessment: Generalized weakness;Difficult to assess due to impaired cognition      Cervical / Trunk Assessment: Kyphotic  Communication   Communication: HOH  Cognition Arousal/Alertness: Lethargic;Suspect due to medications Behavior During Therapy: Restless Overall Cognitive Status: Impaired/Different from baseline Area of Impairment: Orientation;Following commands;Problem solving;Attention;Memory Orientation Level: Disoriented to;Time Current Attention Level: Focused Memory: Decreased short-term memory Following Commands: Follows one step commands inconsistently     Problem Solving: Slow processing;Decreased initiation;Difficulty sequencing;Requires verbal cues;Requires tactile cues General Comments: caregiver present; hx of short term memory loss at baseline     General Comments General comments (skin integrity, edema, etc.): educated caregiver on back precautions     Exercises        Assessment/Plan    PT Assessment Patient needs continued PT services  PT Diagnosis Difficulty walking;Generalized weakness;Acute pain   PT Problem List Decreased strength;Decreased activity tolerance;Decreased balance;Decreased mobility;Decreased cognition;Decreased knowledge of use of DME;Decreased safety awareness;Decreased knowledge of precautions;Pain  PT Treatment Interventions DME instruction;Gait training;Functional mobility training;Therapeutic activities;Therapeutic exercise;Balance training;Neuromuscular re-education;Patient/family education;Stair training   PT Goals (Current goals can be found in the Care Plan section) Acute Rehab PT Goals Patient Stated Goal: none stated PT Goal Formulation: Patient unable to participate in goal setting Time For Goal Achievement: 08/27/13 Potential to Achieve Goals: Fair    Frequency Min 4X/week   Barriers to discharge        Co-evaluation               End of Session Equipment Utilized  During Treatment: Back brace;Gait belt Activity Tolerance: Patient limited by pain;Patient limited by lethargy Patient left: in bed;with call bell/phone within reach;with bed alarm set;with family/visitor present Nurse Communication: Mobility status;Precautions;Patient requests pain meds         Time: 0856-0920 PT Time Calculation (min): 24 min   Charges:   PT Evaluation $Initial PT Evaluation Tier I: 1 Procedure PT Treatments $Therapeutic Activity: 8-22 mins   PT G CodesKennis Carina [place], [place] [phone] 08/20/2013, 11:24 AM

## 2013-08-21 ENCOUNTER — Other Ambulatory Visit (HOSPITAL_COMMUNITY): Payer: Medicare Other

## 2013-08-21 NOTE — Progress Notes (Signed)
Physical Therapy Treatment Patient Details Name: Katherine Rubio MRN: [id number] DOB: 08-04-1922 Today's Date: 08/21/2013    History of Present Illness 80 year old white female who fell and suffered thoracic fractures in the past (no dates available). Pt presented with sever lumbar stenosis (segements L2-3, L3-4 and L4-5). Pt s/p L1-L5 laminectomies and decompression (08/17/13).     PT Comments    Pt continues to be limited in therapy due to pain and questionable self limitations. Pt c/o pain throughout and yells out inconsistently throughout session; pt also lethargic and falling asleep intermittently during therapy session. Attempted sit to stand x 2 with total (A) of 2; pt resistant throughout and thrusting posteriorly. Pt continues to be resistant to therapy. Home CNA present and would like therapy to attempt to see pt with her home RN present tomorrow. Cont to recommend SNF for post acute rehab for safe D/C. If D/C home will need 2 person (A) for mobility.   Follow Up Recommendations  SNF;Supervision/Assistance - 24 hour (if family refuses; pt will need 2 full time caregivers )     Equipment Recommendations  Other (comment) (TBD)    Recommendations for Other Services       Precautions / Restrictions Precautions Precautions: Fall;Back Precaution Comments: pt unable to recall precautions and requries max cues throughout to adhere  Required Braces or Orthoses: Spinal Brace Spinal Brace: Lumbar corset;Applied in sitting position Restrictions Weight Bearing Restrictions: No    Mobility  Bed Mobility Overal bed mobility: Needs Assistance;+2 for physical assistance Bed Mobility: Rolling;Sidelying to Sit;Sit to Sidelying Rolling: Mod assist Sidelying to sit: +2 for physical assistance;Total assist     Sit to sidelying: Total assist;+2 for physical assistance General bed mobility comments: pt agitated and aggressively refusing bed mobilty; max cues for hand placement and  sequencing and pt refusing to (A) with any steps of bed mobility; use of pad and 2 person (A) to bring pt to sitting position; eyes closed throughout bed moblity; incr time requried due to pain   Transfers Overall transfer level: Needs assistance (attempted x2 for sit to stand and unable ) Equipment used: 2 person hand held assist Transfers: Sit to/from Stand           General transfer comment: attempted sit to stand x2 with dependent transfer with use of draw pad and gt belt; pt thrusting posteriorly with each attempt, preventing safe transfer to chair. pt given max cues for sequencing and encouragement but resisting transfers at this time.   Ambulation/Gait                 Stairs            Wheelchair Mobility    Modified Rankin (Stroke Patients Only)       Balance Overall balance assessment: Needs assistance;History of Falls Sitting-balance support: Feet supported;Bilateral upper extremity supported Sitting balance-Leahy Scale: Poor Sitting balance - Comments: pt able to progress to supervision sitt EOB multiple times but then becomes anxious and reaching out for (A) and thrusting posteriorly; pt very anxious and self limiting; requries max encouragement and min to mod (A) for >75% of time to maintain sitting position; pt tolerated sitting EOB 11 min Postural control: Posterior lean     Standing balance comment: unable to achieve                     Cognition Arousal/Alertness: Lethargic;Suspect due to medications Behavior During Therapy: Restless;Anxious Overall Cognitive Status: Impaired/Different from baseline Area of Impairment:  Orientation;Attention;Memory;Following commands;Safety/judgement;Awareness;Problem solving Orientation Level: Disoriented to;Time;Place Current Attention Level: Focused Memory: Decreased recall of precautions Following Commands: Follows one step commands inconsistently Safety/Judgement: Decreased awareness of  safety;Decreased awareness of deficits   Problem Solving: Slow processing;Decreased initiation;Difficulty sequencing;Requires verbal cues;Requires tactile cues General Comments: pt incontinent of urine; unable to answer questions and keeps eyes closed throughout session; hollers out in pain when being spoken to before any mobility takes place; agitated throughout session; decreased motivation to participate     Exercises      General Comments        Pertinent Vitals/Pain Unable to rate pain; crying out in pain throughout; premedicated and lethargic throughout session    Home Living                      Prior Function            PT Goals (current goals can now be found in the care plan section) Acute Rehab PT Goals PT Goal Formulation: Patient unable to participate in goal setting Time For Goal Achievement: 08/27/13 Potential to Achieve Goals: Fair Progress towards PT goals: Progressing toward goals    Frequency  Min 4X/week    PT Plan Current plan remains appropriate    Co-evaluation             End of Session Equipment Utilized During Treatment: Back brace;Gait belt Activity Tolerance: Patient limited by pain;Patient limited by lethargy Patient left: in bed;with call bell/phone within reach;with bed alarm set;with family/visitor present     Time: 9381-8299 PT Time Calculation (min): 26 min  Charges:  $Therapeutic Activity: 23-37 mins                    G CodesKennis Carina [place], [place]  [phone] 08/21/2013, 5:24 PM

## 2013-08-21 NOTE — Progress Notes (Signed)
Patient ID: Katherine Rubio, female   DOB: [birth year], 80 y.o.   MRN: [id number] Subjective:  the patient is somnolent but easily arousable. She is pleasant and looks much better today.  Objective: Vital signs in last 24 hours: Temp:  [98 F (36.7 C)-101.1 F (38.4 C)] 99.3 F (37.4 C) (05/01 0609) Pulse Rate:  [72-83] 79 (05/01 0609) Resp:  [18] 18 (05/01 0609) BP: (103-146)/(52-66) 133/54 mmHg (05/01 0609) SpO2:  [93 %-98 %] 97 % (05/01 0609)  Intake/Output from previous day: 04/30 0701 - 05/01 0700 In: 360 [P.O.:360] Out: -  Intake/Output this shift:    Physical exam patient is moving her lower extremities well.her dressing is clean and dry.  Lab Results: No results found for this basename: WBC, HGB, HCT, PLT,  in the last 72 hours BMET No results found for this basename: NA, K, CL, CO2, GLUCOSE, BUN, CREATININE, CALCIUM,  in the last 72 hours  Studies/Results: No results found.  Assessment/Plan: Postop day 4: I have again encouraged the patient to mobilize/ambulate. We will plan to continue therapy throughout the weekend. Hopefully she will go home on Monday. She is not interested in rehabilitation and has plenty of family support.  LOS: 4 days     [name] 08/21/2013, 7:55 AM

## 2013-08-21 NOTE — Progress Notes (Signed)
UR complete.  Charmian Forbis RN, MSN 

## 2013-08-21 NOTE — Progress Notes (Signed)
Pt pain still very uncontrolled this afternoon, pt c/o pain at the op site as well as severe leg pain R>L. Pt still crying out in pain very frequently in spite of several doses of morphine and percocet.  Pt also c/o of shortness of breath, placed on 2 L via nasal cannula.  Pt was resting with no signs of distress upon entering with the [place].  Pt private sitter concerned about her pain level, will pass along to MD.

## 2013-08-22 ENCOUNTER — Inpatient Hospital Stay (HOSPITAL_COMMUNITY): Payer: Medicare Other

## 2013-08-22 ENCOUNTER — Encounter (HOSPITAL_COMMUNITY): Payer: Self-pay | Admitting: Anesthesiology

## 2013-08-22 ENCOUNTER — Encounter (HOSPITAL_COMMUNITY)
Admission: RE | Disposition: A | Discharge status: Home Health | Payer: Self-pay | Source: Ambulatory Visit | Attending: Neurosurgery

## 2013-08-22 ENCOUNTER — Encounter (HOSPITAL_COMMUNITY): Payer: Medicare Other | Admitting: Anesthesiology

## 2013-08-22 ENCOUNTER — Inpatient Hospital Stay (HOSPITAL_COMMUNITY): Payer: Medicare Other | Admitting: Anesthesiology

## 2013-08-22 DIAGNOSIS — Z9889 Other specified postprocedural states: Secondary | ICD-10-CM

## 2013-08-22 HISTORY — PX: LUMBAR WOUND DEBRIDEMENT: SHX1988

## 2013-08-22 SURGERY — LUMBAR WOUND DEBRIDEMENT
Anesthesia: General | Site: Back

## 2013-08-22 MED ORDER — LIDOCAINE HCL (CARDIAC) 20 MG/ML IV SOLN
INTRAVENOUS | Status: DC | PRN
  Administered 2013-08-22: 80 mg via INTRAVENOUS

## 2013-08-22 MED ORDER — OXYCODONE HCL 5 MG PO TABS: 5.0000 mg | ORAL_TABLET | Freq: Once | ORAL | Status: AC | PRN

## 2013-08-22 MED ORDER — THROMBIN 5000 UNITS EX SOLR
CUTANEOUS | Status: DC | PRN
  Administered 2013-08-22: 22:00:00 via TOPICAL

## 2013-08-22 MED ORDER — SODIUM CHLORIDE 0.9 % IR SOLN
Status: DC | PRN
  Administered 2013-08-22: 22:00:00

## 2013-08-22 MED ORDER — PROMETHAZINE HCL 25 MG/ML IJ SOLN: 6.2500 mg | INTRAMUSCULAR | Status: DC | PRN

## 2013-08-22 MED ORDER — OXYCODONE HCL 5 MG/5ML PO SOLN: 5.0000 mg | Freq: Once | ORAL | Status: AC | PRN

## 2013-08-22 MED ORDER — FENTANYL CITRATE 0.05 MG/ML IJ SOLN
INTRAMUSCULAR | Status: DC | PRN
  Administered 2013-08-22 (×2): 50 ug via INTRAVENOUS

## 2013-08-22 MED ORDER — CEFAZOLIN SODIUM-DEXTROSE 2-3 GM-% IV SOLR
INTRAVENOUS | Status: DC | PRN
  Administered 2013-08-22: 2 g via INTRAVENOUS

## 2013-08-22 MED ORDER — MIDAZOLAM HCL 2 MG/2ML IJ SOLN
INTRAMUSCULAR | Status: AC
  Filled 2013-08-22: qty 2

## 2013-08-22 MED ORDER — 0.9 % SODIUM CHLORIDE (POUR BTL) OPTIME
TOPICAL | Status: DC | PRN
  Administered 2013-08-22: 1000 mL

## 2013-08-22 MED ORDER — FENTANYL CITRATE 0.05 MG/ML IJ SOLN
INTRAMUSCULAR | Status: AC
  Filled 2013-08-22: qty 5

## 2013-08-22 MED ORDER — PROPOFOL 10 MG/ML IV BOLUS
INTRAVENOUS | Status: DC | PRN
  Administered 2013-08-22: 120 mg via INTRAVENOUS

## 2013-08-22 MED ORDER — HYDROMORPHONE HCL PF 1 MG/ML IJ SOLN
0.2500 mg | INTRAMUSCULAR | Status: DC | PRN
  Administered 2013-08-22 (×4): 0.5 mg via INTRAVENOUS

## 2013-08-22 MED ORDER — POTASSIUM CHLORIDE IN NACL 20-0.9 MEQ/L-% IV SOLN
INTRAVENOUS | Status: DC
  Administered 2013-08-23 – 2013-08-30 (×4): via INTRAVENOUS
  Filled 2013-08-22 (×17): qty 1000

## 2013-08-22 MED ORDER — THROMBIN 20000 UNITS EX KIT
PACK | CUTANEOUS | Status: DC | PRN
  Administered 2013-08-22: 22:00:00 via TOPICAL

## 2013-08-22 MED ORDER — ACETAMINOPHEN 10 MG/ML IV SOLN
1000.0000 mg | Freq: Four times a day (QID) | INTRAVENOUS | Status: DC
  Administered 2013-08-22 – 2013-08-23 (×3): 1000 mg via INTRAVENOUS
  Filled 2013-08-22 (×4): qty 100

## 2013-08-22 MED ORDER — ARTIFICIAL TEARS OP OINT
TOPICAL_OINTMENT | OPHTHALMIC | Status: DC | PRN
  Administered 2013-08-22: 1 via OPHTHALMIC

## 2013-08-22 MED ORDER — EPHEDRINE SULFATE 50 MG/ML IJ SOLN
INTRAMUSCULAR | Status: DC | PRN
  Administered 2013-08-22: 5 mg via INTRAVENOUS
  Administered 2013-08-22: 10 mg via INTRAVENOUS

## 2013-08-22 MED ORDER — LACTATED RINGERS IV SOLN
INTRAVENOUS | Status: DC | PRN
  Administered 2013-08-22: 22:00:00 via INTRAVENOUS

## 2013-08-22 MED ORDER — SUCCINYLCHOLINE CHLORIDE 20 MG/ML IJ SOLN
INTRAMUSCULAR | Status: DC | PRN
  Administered 2013-08-22: 120 mg via INTRAVENOUS

## 2013-08-22 SURGICAL SUPPLY — 47 items
APL SKNCLS STERI-STRIP NONHPOA (GAUZE/BANDAGES/DRESSINGS) ×1
APL SRG 60D 8 XTD TIP BNDBL (TIP) ×1
BAG DECANTER FOR FLEXI CONT (MISCELLANEOUS) ×6 IMPLANT
BENZOIN TINCTURE PRP APPL 2/3 (GAUZE/BANDAGES/DRESSINGS) ×3 IMPLANT
BLADE 10 SAFETY STRL DISP (BLADE) ×3 IMPLANT
CANISTER SUCT 3000ML (MISCELLANEOUS) ×3 IMPLANT
CLOSURE WOUND 1/2 X4 (GAUZE/BANDAGES/DRESSINGS) ×1
DRAPE LAPAROTOMY 100X72X124 (DRAPES) ×3 IMPLANT
DRAPE POUCH INSTRU U-SHP 10X18 (DRAPES) ×3 IMPLANT
DRESSING TELFA 8X3 (GAUZE/BANDAGES/DRESSINGS) ×3 IMPLANT
DRSG OPSITE 4X5.5 SM (GAUZE/BANDAGES/DRESSINGS) ×3 IMPLANT
DRSG OPSITE POSTOP 4X6 (GAUZE/BANDAGES/DRESSINGS) ×3 IMPLANT
DURAPREP 26ML APPLICATOR (WOUND CARE) IMPLANT
DURAPREP 6ML APPLICATOR 50/CS (WOUND CARE) IMPLANT
DURASEAL APPLICATOR TIP (TIP) ×2 IMPLANT
DURASEAL SPINE SEALANT 3ML (MISCELLANEOUS) ×2 IMPLANT
ELECT REM PT RETURN 9FT ADLT (ELECTROSURGICAL) ×3
ELECTRODE REM PT RTRN 9FT ADLT (ELECTROSURGICAL) ×1 IMPLANT
GAUZE SPONGE 4X4 16PLY XRAY LF (GAUZE/BANDAGES/DRESSINGS) IMPLANT
GLOVE BIO SURGEON STRL SZ8 (GLOVE) ×3 IMPLANT
GOWN BRE IMP SLV AUR LG STRL (GOWN DISPOSABLE) IMPLANT
GOWN BRE IMP SLV AUR XL STRL (GOWN DISPOSABLE) IMPLANT
GOWN STRL REIN 2XL LVL4 (GOWN DISPOSABLE) ×3 IMPLANT
KIT BASIN OR (CUSTOM PROCEDURE TRAY) ×3 IMPLANT
KIT ROOM TURNOVER OR (KITS) ×3 IMPLANT
NDL HYPO 25X1 1.5 SAFETY (NEEDLE) ×1 IMPLANT
NDL SPNL 20GX3.5 QUINCKE YW (NEEDLE) IMPLANT
NEEDLE HYPO 18GX1.5 BLUNT FILL (NEEDLE) IMPLANT
NEEDLE HYPO 25X1 1.5 SAFETY (NEEDLE) ×3 IMPLANT
NEEDLE SPNL 20GX3.5 QUINCKE YW (NEEDLE) IMPLANT
NS IRRIG 1000ML POUR BTL (IV SOLUTION) ×3 IMPLANT
PACK LAMINECTOMY NEURO (CUSTOM PROCEDURE TRAY) ×3 IMPLANT
PAD ARMBOARD 7.5X6 YLW CONV (MISCELLANEOUS) ×9 IMPLANT
STRIP CLOSURE SKIN 1/2X4 (GAUZE/BANDAGES/DRESSINGS) ×2 IMPLANT
SUT NURALON 4 0 TR CR/8 (SUTURE) ×2 IMPLANT
SUT VIC AB 0 CT1 18XCR BRD8 (SUTURE) ×2 IMPLANT
SUT VIC AB 0 CT1 8-18 (SUTURE) ×6
SUT VIC AB 2-0 CP2 18 (SUTURE) ×3 IMPLANT
SUT VIC AB 3-0 SH 8-18 (SUTURE) ×5 IMPLANT
SWAB CULTURE LIQ STUART DBL (MISCELLANEOUS) ×3 IMPLANT
SYR 20ML ECCENTRIC (SYRINGE) ×3 IMPLANT
SYR 3ML LL SCALE MARK (SYRINGE) IMPLANT
TAPE STRIPS DRAPE STRL (GAUZE/BANDAGES/DRESSINGS) ×3 IMPLANT
TOWEL OR 17X24 6PK STRL BLUE (TOWEL DISPOSABLE) ×3 IMPLANT
TOWEL OR 17X26 10 PK STRL BLUE (TOWEL DISPOSABLE) ×3 IMPLANT
TUBE ANAEROBIC SPECIMEN COL (MISCELLANEOUS) ×3 IMPLANT
WATER STERILE IRR 1000ML POUR (IV SOLUTION) ×3 IMPLANT

## 2013-08-22 NOTE — Anesthesia Procedure Notes (Signed)
Procedure Name: Intubation Date/Time: 08/22/2013 10:05 PM Performed by: Maude Leriche D Pre-anesthesia Checklist: Patient identified, Emergency Drugs available, Suction available, Patient being monitored and Timeout performed Patient Re-evaluated:Patient Re-evaluated prior to inductionOxygen Delivery Method: Circle system utilized Preoxygenation: Pre-oxygenation with 100% oxygen Intubation Type: IV induction, Rapid sequence and Cricoid Pressure applied Laryngoscope Size: Miller and 2 Grade View: Grade II Tube type: Oral Tube size: 7.5 mm Number of attempts: 1 Airway Equipment and Method: Stylet Placement Confirmation: ETT inserted through vocal cords under direct vision,  positive ETCO2 and breath sounds checked- equal and bilateral Secured at: 21 cm Tube secured with: Tape Dental Injury: Teeth and Oropharynx as per pre-operative assessment

## 2013-08-22 NOTE — Progress Notes (Signed)
Patient slept peacefully throughout the night. She rarely complained about pain. Her home CNA helped to feed her some applesauce and Ensure since she hadn't eaten much last evening.She did run a fever of 100.5 last evening but it came back into normal range after medication and has been stable since. Will continue to monitor. Rande Brunt Dquan Cortopassi

## 2013-08-22 NOTE — Anesthesia Preprocedure Evaluation (Addendum)
Anesthesia Evaluation  Patient identified by MRN, date of birth, ID band Patient confused    Reviewed: Allergy & Precautions, H&P , NPO status , Patient's Chart, lab work & pertinent test results, reviewed documented beta blocker date and time   History of Anesthesia Complications (+) PONV and history of anesthetic complications  Airway Mallampati: II  Neck ROM: Full  Mouth opening: Limited Mouth Opening  Dental  (+) Teeth Intact, Dental Advisory Given, Poor Dentition   Pulmonary neg pulmonary ROS,  breath sounds clear to auscultation  Pulmonary exam normal       Cardiovascular hypertension, On Medications and On Home Beta Blockers     Neuro/Psych PSYCHIATRIC DISORDERS Depression  Neuromuscular disease    GI/Hepatic Neg liver ROS, GERD-  Medicated and Controlled,  Endo/Other  negative endocrine ROS  Renal/GU negative Renal ROS  negative genitourinary   Musculoskeletal negative musculoskeletal ROS (+)   Abdominal   Peds  Hematology negative hematology ROS (+)   Anesthesia Other Findings See surgeon's H&P   Reproductive/Obstetrics negative OB ROS                         Anesthesia Physical  Anesthesia Plan  ASA: III and emergent  Anesthesia Plan: General   Post-op Pain Management:    Induction: Intravenous  Airway Management Planned: Oral ETT  Additional Equipment:   Intra-op Plan:   Post-operative Plan: Extubation in OR  Informed Consent: I have reviewed the patients History and Physical, chart, labs and discussed the procedure including the risks, benefits and alternatives for the proposed anesthesia with the patient or authorized representative who has indicated his/her understanding and acceptance.   Consent reviewed with POA  Plan Discussed with: CRNA, Surgeon and Anesthesiologist  Anesthesia Plan Comments:        Anesthesia Quick Evaluation

## 2013-08-22 NOTE — Progress Notes (Signed)
Patient just left the unit for an emergency surgery at the Neuro OR with Dr. Sherley Bounds. Vitals before transfer: T 98.2 BP 114/61 HR 64 RR 19 O2Sat 92%. Trina Asch Fransisca Kaufmann, RN

## 2013-08-22 NOTE — Op Note (Signed)
08/17/2013 - 08/22/2013  10:45 PM  PATIENT:  Katherine Rubio  80 y.o. 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 Today she developed significant left leg weakness with severe pain in her back and legs. MRI showed a large epidural compressive hematoma. I recommended emergent exploration with removal of the hematoma. Patient understood the risks, benefits, and alternatives and potential outcomes and wished to proceed.  PROCEDURE DETAILS: The patient was taken to the operating room and after induction of adequate generalized endotracheal anesthesia, the patient was rolled into the prone position on the Wilson frame and all pressure points were padded. The lumbar region was cleaned with Betadine and then prepped with DuraPrep and draped in the usual sterile fashion. 5 cc of local anesthesia was injected and her old dorsal midline incision was reopened and carried down to the lumbo sacral fascia. There was a suprafascial large clotted hematoma. The fascia was opened and the paraspinous musculature was opened and a large epidural hematoma was found and removed with suction. I removed the hematoma from the laminectomy site and found to small Prolene sutures in the dura with leakage of CSF between the sutures. A single 4-0 Nurolon suture was placed. Valsalva maneuver showed no more leak of CSF. I irrigated with saline  solution containing bacitracin. Achieved hemostasis with Surgifoam and bipolar cautery, lined the dura with Gelfoam and Tisseel fibrin glue, and then closed the muscle and fascia with 0 Vicryl. I closed the subcutaneous tissues with 2-0 Vicryl and the subcuticular tissues with 3-0 Vicryl. The skin was then closed with benzoin and Steri-Strips. The drapes were removed, a sterile dressing was applied. The patient was awakened from general anesthesia and transferred to the recovery room in stable condition. At the end of the procedure all sponge, needle and instrument counts were correct.   PLAN OF CARE: Admit to inpatient   PATIENT DISPOSITION:  PACU - hemodynamically stable.   Delay start of Pharmacological VTE agent (>24hrs) due to surgical blood loss or risk of bleeding:  yes

## 2013-08-22 NOTE — Progress Notes (Signed)
Patient ID: Katherine Rubio, female   DOB: 11-Sep-1922, 80 y.o.   MRN: [id number] Patient's MRI showed a large epidural hematoma that appears compressive. Given the weakness in her leg and her severe pain I think it is best to evacuate epidural hematoma with a lumbar reexploration. We would do this emergently. The risks include but are not limited to bleeding, infection, CSF leak, need for further surgery, lack of relief of symptoms, worsening symptoms, and anesthesia risks. She agrees to proceed. She is screaming in pain in the holding room. We will move forward emergently.

## 2013-08-22 NOTE — Progress Notes (Signed)
Pt's BP 80/40. Asymptomatic. MD notified. Ordered to maintain fluids at 36ml/hour. MRI completed.

## 2013-08-22 NOTE — Progress Notes (Signed)
Physical Therapy Treatment Patient Details Name: Katherine Rubio MRN: [id number] DOB: 1923/01/17 Today's Date: 08/22/2013    History of Present Illness 80 year old white female who fell and suffered thoracic fractures in the past (no dates available). Pt presented with sever lumbar stenosis (segements L2-3, L3-4 and L4-5). Pt s/p L1-L5 laminectomies and decompression (08/17/13).     PT Comments    Pt continues to be limited with mobility secondary to pain vs self limitations. Progressed with some LE exercises today but required max (A) to perform due to decreased ability to follow commands. Pt (A) with pericare hygiene in bed with home CNA assisting as well. Cont to recommend SNF for post acute care.   Follow Up Recommendations  SNF;Supervision/Assistance - 24 hour     Equipment Recommendations  Hospital bed    Recommendations for Other Services OT consult     Precautions / Restrictions Precautions Precautions: Fall;Back Precaution Comments: pt unable to recall back precautions  Required Braces or Orthoses: Spinal Brace Spinal Brace: Lumbar corset;Applied in sitting position Restrictions Weight Bearing Restrictions: No    Mobility  Bed Mobility Overal bed mobility: Needs Assistance;+2 for physical assistance Bed Mobility: Rolling;Sidelying to Sit;Sit to Sidelying Rolling: Min assist Sidelying to sit: +2 for physical assistance;Total assist     Sit to sidelying: Total assist;+2 for physical assistance General bed mobility comments: pt continues to require increased time for all mobility and movement; c/o LE pain today; did better with rolling and was able to reach to railings and (A) with UEs; pt responds well to soft verbal cues and requires multimodal cues and max encouragement for all mobility; use of draw pad and two people (A) to bring pt from sidelying <> sit ; pt with eyes closed throughout bed mobility and crying out in pain intermittently ; pt very anxious; pt  incontinent with urine when sitting EOB and required return to supine with total (A) for hygiene    Transfers Overall transfer level: Needs assistance Equipment used: 2 person hand held assist Transfers: Sit to/from Stand Sit to Stand: Total assist;+2 physical assistance         General transfer comment: attempted sit to stand x 3; pt was able to lean anteriorly today onto therapist intially but when attempting to stand continued to resist and push posteriorly; pt greatly limited by pain and will reach out and flail arms uncontrollably when attempting transfers; max cues and encouragement throughout; eduacted on deep breathing techniques to reduce anxiety   Ambulation/Gait                 Stairs            Wheelchair Mobility    Modified Rankin (Stroke Patients Only)       Balance Overall balance assessment: Needs assistance;History of Falls Sitting-balance support: Feet supported;Bilateral upper extremity supported;Single extremity supported Sitting balance-Leahy Scale: Fair Sitting balance - Comments: pt sitting EOB ~18 min today with bil to single UE support; eyes closed ~50% of time; was able to maintain midline more today with attempts to lay back down at times; multimodal cues to maintain midline and max encouragement to participate with OOB activities; perform AAROM exercises EOB; pt resistant and unablet o follow commands  Postural control: Posterior lean     Standing balance comment: unable to achieve                     Cognition Arousal/Alertness: Lethargic;Suspect due to medications Behavior During Therapy: Restless;Anxious Overall Cognitive  Status: Impaired/Different from baseline Area of Impairment: Orientation;Attention;Memory;Following commands;Safety/judgement;Awareness;Problem solving Orientation Level: Disoriented to;Time;Place Current Attention Level: Focused Memory: Decreased recall of precautions Following Commands: Follows one step  commands inconsistently Safety/Judgement: Decreased awareness of safety;Decreased awareness of deficits Awareness: Intellectual Problem Solving: Slow processing;Decreased initiation;Difficulty sequencing;Requires verbal cues;Requires tactile cues General Comments: caregiver present; pt continues to be lethargic during session but able to open eyes more vs last session     Exercises General Exercises - Lower Extremity Ankle Circles/Pumps: PROM;Both;10 reps;Supine Long Arc Quad: AAROM;Both;10 reps;Seated (pt c/o Lt knee pain ) Heel Slides: AAROM;Both;Supine    General Comments General comments (skin integrity, edema, etc.): caregiver present for session       Pertinent Vitals/Pain Unable to rate but c/o pain in Lt knee and back throughout session; tearful; premedicated     Home Living                      Prior Function            PT Goals (current goals can now be found in the care plan section) Acute Rehab PT Goals Patient Stated Goal: to not have my legs hurting PT Goal Formulation: Patient unable to participate in goal setting Time For Goal Achievement: 08/27/13 Potential to Achieve Goals: Fair Progress towards PT goals: Not progressing toward goals - comment (self limiting vs limited by pain )    Frequency  Min 4X/week    PT Plan Current plan remains appropriate    Co-evaluation             End of Session Equipment Utilized During Treatment: Back brace;Gait belt Activity Tolerance: Patient limited by pain;Patient limited by lethargy Patient left: in bed;with call bell/phone within reach;with bed alarm set;with family/visitor present     Time: 9021-1155 PT Time Calculation (min): 39 min  Charges:  $Therapeutic Exercise: 8-22 mins $Therapeutic Activity: 23-37 mins                    G CodesKennis Carina [place], [place]  [phone] 08/22/2013, 4:20 PM

## 2013-08-22 NOTE — Progress Notes (Signed)
No issues overnight, however pt in significant pain this am.  EXAM:  BP 134/66  Pulse 73  Temp(Src) 98 F (36.7 C) (Oral)  Resp 20  Ht 5\' 4"  (1.626 m)  Wt 82.4 kg (181 lb 10.5 oz)  BMI 31.17 kg/m2  SpO2 98%  Moving lower extremities well Wound dry  IMPRESSION:  80 y.o. female POD# 5 s/p lami - Pain control continues to be an issue  PLAN: - Cont to attempt to mobilize as pain is controlled.

## 2013-08-22 NOTE — Progress Notes (Signed)
Patient ID: Katherine Rubio, female   DOB: [birth year], 80 y.o.   MRN: [id number] I was called to see this patient because of what was felt to be acute left leg weakness. She has been having increasing pain over the last couple of days. her pain was fairly difficult to control today. She denies numbness or tingling in the legs. Her caregiver who is at the bedside states that she feels like the weakness is new today. I went back to her physical therapy note to try to discern other or not her weakness is new. The difficulty is knowing whether or not this is weakness that was just never recognized on exam or whether was new today. She has 1-2/5 hip flexor strength on the left, 1-2/5 knee extensor strength on the left, 2/5 left dorsiflexion, 3 /5 left plantar flexion, but fairly good EHL strength and we will search does fairly well. Her right lotion and he is much stronger and is at least antigravity throughout there she does have some mild weakness proximally.  I have ordered a stat MRI of the lumbar spine to evaluate the cause of her weakness. It is difficult to imagine a situation that would cause only left leg weakness given the type of surgery that she had based on her operative report. We will keep her n.p.o.

## 2013-08-22 NOTE — Transfer of Care (Signed)
Immediate Anesthesia Transfer of Care Note  Patient: Katherine Rubio  Procedure(s) Performed: Procedure(s): EXPLORATION OF LUMBAR WOUND WITH REMOVAL OF EPIDURAL HEMATOMA, Repair of pseudomeningocel (N/A)  Patient Location: PACU  Anesthesia Type:General  Level of Consciousness: sedated  Airway & Oxygen Therapy: Patient Spontanous Breathing and Patient connected to face mask oxygen  Post-op Assessment: Report given to PACU RN and Post -op Vital signs reviewed and stable  Post vital signs: Reviewed and stable  Complications: No apparent anesthesia complications

## 2013-08-23 ENCOUNTER — Inpatient Hospital Stay (HOSPITAL_COMMUNITY): Payer: Medicare Other

## 2013-08-23 LAB — CBC
HCT: 29.8 % — ABNORMAL LOW (ref 36.0–46.0)
Hemoglobin: 10.1 g/dL — ABNORMAL LOW (ref 12.0–15.0)
MCH: 31.6 pg (ref 26.0–34.0)
MCHC: 33.9 g/dL (ref 30.0–36.0)
MCV: 93.1 fL (ref 78.0–100.0)
PLATELETS: 218 10*3/uL (ref 150–400)
RBC: 3.2 MIL/uL — AB (ref 3.87–5.11)
RDW: 13.2 % (ref 11.5–15.5)
WBC: 10.1 10*3/uL (ref 4.0–10.5)

## 2013-08-23 LAB — BASIC METABOLIC PANEL
BUN: 13 mg/dL (ref 6–23)
CHLORIDE: 101 meq/L (ref 96–112)
CO2: 22 meq/L (ref 19–32)
Calcium: 8.5 mg/dL (ref 8.4–10.5)
Creatinine, Ser: 0.63 mg/dL (ref 0.50–1.10)
GFR calc Af Amer: >90 mL/min (ref >90)
GFR calc non Af Amer: 78 mL/min — ABNORMAL LOW (ref >90)
Glucose, Bld: 123 mg/dL — ABNORMAL HIGH (ref 70–99)
Potassium: 3.7 mEq/L (ref 3.7–5.3)
SODIUM: 137 meq/L (ref 137–147)

## 2013-08-23 MED ORDER — HALOPERIDOL LACTATE 5 MG/ML IJ SOLN
5.0000 mg | Freq: Four times a day (QID) | INTRAMUSCULAR | Status: DC | PRN
  Administered 2013-08-23 – 2013-08-27 (×3): 5 mg via INTRAMUSCULAR
  Filled 2013-08-23 (×2): qty 1

## 2013-08-23 MED ORDER — CEFAZOLIN SODIUM 1-5 GM-% IV SOLN
1.0000 g | Freq: Three times a day (TID) | INTRAVENOUS | Status: AC
  Administered 2013-08-23 (×2): 1 g via INTRAVENOUS
  Filled 2013-08-23 (×2): qty 50

## 2013-08-23 MED ORDER — MENTHOL 3 MG MT LOZG: 1.0000 | LOZENGE | OROMUCOSAL | Status: DC | PRN

## 2013-08-23 MED ORDER — SODIUM CHLORIDE 0.9 % IV SOLN: 250.0000 mL | INTRAVENOUS | Status: DC

## 2013-08-23 MED ORDER — ACETAMINOPHEN 650 MG RE SUPP
650.0000 mg | RECTAL | Status: DC | PRN
Start: 2013-08-23 — End: 2013-09-01
  Filled 2013-08-23: qty 1

## 2013-08-23 MED ORDER — ACETAMINOPHEN 325 MG PO TABS
650.0000 mg | ORAL_TABLET | ORAL | Status: DC | PRN
  Administered 2013-08-30: 650 mg via ORAL
  Filled 2013-08-23 (×2): qty 2

## 2013-08-23 MED ORDER — SODIUM CHLORIDE 0.9 % IJ SOLN: 3.0000 mL | INTRAMUSCULAR | Status: DC | PRN

## 2013-08-23 MED ORDER — ONDANSETRON HCL 4 MG/2ML IJ SOLN: 4.0000 mg | INTRAMUSCULAR | Status: DC | PRN

## 2013-08-23 MED ORDER — SODIUM CHLORIDE 0.9 % IJ SOLN
3.0000 mL | Freq: Two times a day (BID) | INTRAMUSCULAR | Status: DC
  Administered 2013-08-23 – 2013-08-30 (×13): 3 mL via INTRAVENOUS

## 2013-08-23 MED ORDER — PHENOL 1.4 % MT LIQD: 1.0000 | OROMUCOSAL | Status: DC | PRN

## 2013-08-23 NOTE — Progress Notes (Signed)
Patient ID: Katherine Rubio, female   DOB: November 06, 1922, 80 y.o.   MRN: [id number] I was called to see this patient because ever since I pulled her dressing off this morning she has been screaming out in pain. She has decreased movement of the left lower extremity according to her caregiver. She is somewhat confused. Therefore I ordered a stat CT scan of the lumbar spine(I did not think she would tolerate an MRI) and that CT scan looks quite good. I see no residual stenosis. There is air in the epidural space from the Gelfoam and Tisseel fibrin glue. I do not see significant residual compression of the neural elements. Obviously this is not as sensitive his MRI but again I do not believe she would tolerate lying flat for MRI at this time as she is quite hysterical. I do think that is part of the issue here. I think she is indeed hysterical and therefore is difficult to get any history or exam on her that is reliable. At times she will calm down and be perfectly still and reasonable, but only for brief periods.

## 2013-08-23 NOTE — Anesthesia Postprocedure Evaluation (Signed)
Anesthesia Post Note  Patient: Katherine Rubio  Procedure(s) Performed: Procedure(s) (LRB): EXPLORATION OF LUMBAR WOUND WITH REMOVAL OF EPIDURAL HEMATOMA, Repair of pseudomeningocel (N/A)  Anesthesia type: general  Patient location: PACU  Post pain: Pain level controlled  Post assessment: Patient's Cardiovascular Status Stable  Last Vitals:  Filed Vitals:   08/22/13 2345  BP: 143/61  Pulse: 77  Temp:   Resp: 19    Post vital signs: Reviewed and stable  Level of consciousness: sedated  Complications: No apparent anesthesia complications

## 2013-08-23 NOTE — Progress Notes (Signed)
Called by Rn for second set of eyes at 1345, for patient has been crying hysterically and screaming since this am with c/o pain in back and belly and left leg.  VSS.  Patient is lying on her side crying and yelling out, she is very sensitive to touch. Patient will have brief periods of being calm.  Belly is slightly firm and distended.  Family and caregiver at bedside.  Dr. Ronnald Ramp at bedside and talking to family.   Patient did finally calm down some.  No RRT interventions, Rn to call if assistance needed

## 2013-08-23 NOTE — Progress Notes (Signed)
Pt was given haldol 2 mg at  1535 quiet down for about 30 min and started yelling in pain.  Medication for pain at 1615  , Po ensure drink offered ,pt also took few spoonful of vanilla ice cream and refused dinner.Pt condition improved  Verbalized some relieve of pain and wanted to sit up in a chair after emotional support and prayer offered by primary RN. [id number].  Pt sat at the side of the bed for 15 minutes and assisted back to lay  In bed.  Very calm and cooperative at this time and rated pain level at 2/10  On pain scale the first time ever. Perineal care and all other needed care rendered. Pt  resting comfortable in bed awaiting Abdominal Xray and other lab works.

## 2013-08-23 NOTE — Progress Notes (Signed)
Pt has been yelling and screaming for pain ever since the hemovac drain was removed this am. .  Pt was medicated for pain . log rolled and repositioned as needed,  ice pack to surgical site with not much relief of pain . Crying hysterically continuesly. Granddaughter and personal care giver at bedside but pt continue to scream loud for back pain LLE and abdominal unconsolable. refusing  Po intake too.. MD jones was notified  earlier  at1030 am to come and re evaluate pt and this was done .,Stat CT done.  MD notified of result and told pt is still screaming and difficult to console. Fleet enema given  no result yet, however  pt is  passing gas. RN called  Rapid response team  and Dr Ronnald Ramp again to see pt . Vs done  ,stable . MD Ronnald Ramp and charge nurse and rapid response Nurse at bed side now. Pt still crying and yelling. Care giver and granddaughter let out of the bedside to see how pt will react. RN will continue to monitor Pt.

## 2013-08-23 NOTE — Progress Notes (Signed)
Patient ID: Katherine Rubio, female   DOB: Nov 14, 1922, 80 y.o.   MRN: [id number] Seems much better today. She does not cry out in pain anymore. She slept well through the night. Still seems to have some weakness in the left leg but her caretaker feels like she is moving it better through the night and she was before the surgery. She is moving her right leg fairly well. Her incision is clean dry and intact. I removed her Hemovac drain has had very little output. We will try to mobilize her as best we can. She does not tolerate movement very well from a pain standpoint but I think it is important to try to get her mobilized.

## 2013-08-23 NOTE — Progress Notes (Signed)
PT Cancellation Note  Patient Details Name: Katherine Rubio MRN: [id number] DOB: 07-Dec-1922   Cancelled Treatment:    Reason Eval/Treat Not Completed: Pain limiting ability to participate. Pt yelling out in pain and hysterically crying. Unable to participate in therapy at this time. Will re-attempt when pt is appropriate and pain managed.    [place], [place] 08/23/2013, 11:46 AM

## 2013-08-24 LAB — CBC WITH DIFFERENTIAL/PLATELET
Basophils Absolute: 0 10*3/uL (ref 0.0–0.1)
Basophils Relative: 0 % (ref 0–1)
EOS ABS: 0 10*3/uL (ref 0.0–0.7)
Eosinophils Relative: 0 % (ref 0–5)
HCT: 26.4 % — ABNORMAL LOW (ref 36.0–46.0)
Hemoglobin: 8.9 g/dL — ABNORMAL LOW (ref 12.0–15.0)
LYMPHS ABS: 1.7 10*3/uL (ref 0.7–4.0)
Lymphocytes Relative: 22 % (ref 12–46)
MCH: 31.6 pg (ref 26.0–34.0)
MCHC: 33.7 g/dL (ref 30.0–36.0)
MCV: 93.6 fL (ref 78.0–100.0)
MONOS PCT: 13 % — AB (ref 3–12)
Monocytes Absolute: 1 10*3/uL (ref 0.1–1.0)
NEUTROS PCT: 65 % (ref 43–77)
Neutro Abs: 5 10*3/uL (ref 1.7–7.7)
Platelets: 193 10*3/uL (ref 150–400)
RBC: 2.82 MIL/uL — AB (ref 3.87–5.11)
RDW: 13.9 % (ref 11.5–15.5)
WBC: 7.7 10*3/uL (ref 4.0–10.5)

## 2013-08-24 LAB — BASIC METABOLIC PANEL
BUN: 22 mg/dL (ref 6–23)
CO2: 22 meq/L (ref 19–32)
Calcium: 8.2 mg/dL — ABNORMAL LOW (ref 8.4–10.5)
Chloride: 105 mEq/L (ref 96–112)
Creatinine, Ser: 0.83 mg/dL (ref 0.50–1.10)
GFR calc Af Amer: 71 mL/min — ABNORMAL LOW (ref >90)
GFR, EST NON AFRICAN AMERICAN: 61 mL/min — AB (ref >90)
GLUCOSE: 102 mg/dL — AB (ref 70–99)
Potassium: 4.1 mEq/L (ref 3.7–5.3)
SODIUM: 138 meq/L (ref 137–147)

## 2013-08-24 MED ORDER — ENSURE COMPLETE PO LIQD
237.0000 mL | Freq: Two times a day (BID) | ORAL | Status: DC
  Administered 2013-08-24 – 2013-08-31 (×9): 237 mL via ORAL

## 2013-08-24 MED ORDER — BOOST / RESOURCE BREEZE PO LIQD
1.0000 | ORAL | Status: DC
  Administered 2013-08-25 – 2013-08-31 (×6): 1 via ORAL

## 2013-08-24 MED ORDER — ADULT MULTIVITAMIN W/MINERALS CH
1.0000 | ORAL_TABLET | Freq: Every day | ORAL | Status: DC
  Administered 2013-08-24 – 2013-08-31 (×7): 1 via ORAL
  Filled 2013-08-24 (×8): qty 1

## 2013-08-24 NOTE — Progress Notes (Signed)
INITIAL NUTRITION ASSESSMENT  DOCUMENTATION CODES Per approved criteria  -Obesity, unspecified    INTERVENTION: Provide Ensure Complete BID after meals Provide Resource Breeze with breakfast Recommend Magic Cup ice cream with lunch/dinner- available on unit Provide Multivitamin with minerals daily  NUTRITION DIAGNOSIS: Inadequate oral intake related to poor appetite as evidenced by pt refusing most meals.  Goal: Pt to meet >/= 90% of their estimated nutrition needs    Monitor:  PO intake, weight trend, labs  Reason for Assessment: Low Braden  80 y.o. female  Admitting Dx: <principal problem not specified>  ASSESSMENT: 80 year old white female who's had some chronic back troubles. She fell and suffered a thoracic fracture sometime ago. This was treated with the kyphoplasty she did well. More recently she's had increasing back pain which radiates into her legs, consistent with neurogenic claudication. She has failed medical management and was worked up with a lumbar MRI. This demonstrated severe spinal stenosis at L2-3, L3-4 and L4-5. S/p Procedure(s) (LRB):  EXPLORATION OF LUMBAR WOUND WITH REMOVAL OF EPIDURAL HEMATOMA, Repair of pseudomeningocel (N/A)  RD attempted to visit pt x2 but, pt occupied with therapists. Per nursing notes 4/28 pt ate 45% and 70% of her meals but, since she has either refused the meal or only eaten 10-30% (a few bites). Appears pt's weight has dropped 3 lbs since admission-2% weight loss.  Height: Ht Readings from Last 1 Encounters:  08/17/13 5\' 4"  (1.626 m)    Weight: Wt Readings from Last 1 Encounters:  08/18/13 181 lb 10.5 oz (82.4 kg)    Ideal Body Weight: 120 lbs  % Ideal Body Weight: 151%  Wt Readings from Last 10 Encounters:  08/18/13 181 lb 10.5 oz (82.4 kg)  08/18/13 181 lb 10.5 oz (82.4 kg)  08/18/13 181 lb 10.5 oz (82.4 kg)  07/28/13 184 lb (83.462 kg)  07/16/13 186 lb (84.369 kg)  07/10/13 186 lb (84.369 kg)  07/09/13 184  lb 8 oz (83.689 kg)  07/02/13 184 lb 8 oz (83.689 kg)  03/27/13 183 lb (83.008 kg)  10/15/12 186 lb 1.3 oz (84.405 kg)    Usual Body Weight: 184-186 lbs  % Usual Body Weight: 98%  BMI:  Body mass index is 31.17 kg/(m^2). (Obese)  Estimated Nutritional Needs: Kcal: 1500-1650 Protein: 80-90 grams Fluid: > 1.6 L/day  Skin: +2 RLE and LLE edema; closed back incision  on posterior back with closed system drain  Diet Order: Sodium Restricted  EDUCATION NEEDS: -No education needs identified at this time   Intake/Output Summary (Last 24 hours) at 08/24/13 1400 Last data filed at 08/23/13 1800  Gross per 24 hour  Intake   1125 ml  Output    150 ml  Net    975 ml    Last BM: 5/3   Labs:   Recent Labs Lab 08/18/13 0232 08/23/13 2040  NA 139 137  K 4.1 3.7  CL 105 101  CO2 24 22  BUN 20 13  CREATININE 0.72 0.63  CALCIUM 8.2* 8.5  GLUCOSE 150* 123*    CBG (last 3)  No results found for this basename: GLUCAP,  in the last 72 hours  Scheduled Meds: . bisoprolol  2.5 mg Oral Daily  . bupivacaine liposome  20 mL Infiltration Once  . docusate sodium  100 mg Oral BID  . famotidine  10 mg Oral BID  . hydrochlorothiazide  6.25 mg Oral Q24H  . losartan  12.5 mg Oral Daily  . mupirocin ointment   Nasal BID  .  nystatin   Topical QID  . sertraline  50 mg Oral Daily  . simvastatin  10 mg Oral q1800  . sodium chloride  3 mL Intravenous Q12H    Continuous Infusions: . sodium chloride    . 0.9 % NaCl with KCl 20 mEq / L 75 mL/hr at 08/23/13 1538    Past Medical History  Diagnosis Date  . Depression   . Hyperlipidemia   . Hypertension   . Chronic low back pain   . PONV (postoperative nausea and vomiting)   . GERD (gastroesophageal reflux disease)   . Osteoarthritis     Past Surgical History  Procedure Laterality Date  . Back surgery    . Knee surgery      left knee replacement  . Eye surgery      BIL CATARACT  . Kyphoplasty  05/15/2012    Procedure:  KYPHOPLASTY;  Surgeon: [name], MD;  Location: [facility];  Service: Neurosurgery;  Laterality: N/A;  Thoracic Six Kyphoplasty  . Lumbar laminectomy  08/17/2013    L 1 L2 L3 L4 L5         DR Arnoldo Morale  . Lumbar laminectomy/decompression microdiscectomy N/A 08/17/2013    Procedure: LUMBAR LAMINECTOMY/DECOMPRESSION MICRODISCECTOMY [place] TWO-THREE,LUMBAR THREE-FOUR,LUMBAR FOUR-FIVE;  Surgeon: [name], MD;  Location: [facility];  Service: Neurosurgery;  Laterality: N/A;    Pryor Ochoa RD, LDN Inpatient Clinical Dietitian Pager: [phone] After Hours Pager: [phone]

## 2013-08-24 NOTE — Progress Notes (Signed)
Occupational Therapy Treatment Patient Details Name: Katherine Rubio MRN: [id number] DOB: Jun 19, 1922 Today's Date: 08/24/2013    History of present illness 80 year old white female who fell and suffered thoracic fractures in the past (no dates available). Pt presented with sever lumbar stenosis (segements L2-3, L3-4 and L4-5). Pt s/p L1-L5 laminectomies and decompression (08/17/13). Pt found to have a Postoperative lumbar epidural hematoma  as well as pseudomengingocele and underwent lumbar exploration and evacuation of hematoms 08/22/13   OT comments  Pt somewhat improved today.  She was able to assist with supine to sit and was able to sit EOB with min A with no complaints/indications of pain - much calmer.  Pt incontinent of stool while EOB.  Able to move her into partial stand with total A +2, and then transferred her to [facility] with total +2 and a third person for safety.  She however, was unable to assist with standing from [facility], requiring use of lift to transfer her from [facility] to recliner.  Home caregivers present throughout session.   Will need 2 people at home with her 24 hours/day as she requires +2 physical assist for all movement.  She will need hoyer lift at home.   Follow Up Recommendations  SNF;Supervision/Assistance - 24 hour  - will need 2 people 24 hours/day if discharge home.    Equipment Recommendations  Hospital bed;Wheelchair (measurements OT); hoyer lift.    Recommendations for Other Services      Precautions / Restrictions Precautions Precautions: Fall;Back Precaution Comments: Pt unable to recall or follow back precautions  Required Braces or Orthoses: Spinal Brace Spinal Brace: Lumbar corset;Applied in sitting position       Mobility Bed Mobility Overal bed mobility: +2 for physical assistance Bed Mobility: Rolling;Sidelying to Sit Rolling: Max assist;+2 for physical assistance Sidelying to sit: Max assist;+2 for physical assistance       General bed  mobility comments: Pt able to pull on bed rail to assist with rolling and able to assist with lifting shoulder up to EOB.  She was able to scoot her hips forward to EOB with mod A and max verbal cues. Pt needed hand over hand assist to initially find the bedrail and both verbal and tactile assist to preform log roll correctly.  Less posterior pushing and less resistance to transitions than described in last notes.   Transfers Overall transfer level: Needs assistance Equipment used: 2 person hand held assist Transfers: Sit to/from [employer] Sit to Stand: Total assist;+2 physical assistance   Squat pivot transfers: Total assist;+2 physical assistance     General transfer comment: Pt incontinent of bowel while sitting EOB.  Pt moved sit to partial stand x 1 with total A +2 for hygiene.  Pt indicating that she needed to have further BM and was transferred to [facility] with total A +2 and a third person to stabillize [facility].  Attempted x 3 to move pt to standing from [facility] without success as pt becomes limp and does not attempt to bear weight through LEs.  Attempted to use stedy lift withoug success.  Pt transferred from [facility] to recliner using maxi move. Bil knees blocked for transfers and attempts at standing from both elevated bed and elevated BSC.  Pt would start initiating the movement well with forward translation of her trunk and pushing with bil hands from arm rests on BSC, but there seemed to be little to no initiation to push from either leg.  As therapists would progress to lifting  her buttocks she would lose her hand grips bil and start clutching at any object she could reach for.      Balance Overall balance assessment: Needs assistance Sitting-balance support: Feet supported Sitting balance-Leahy Scale: Fair Sitting balance - Comments: Much better sitting balance today.  Good upright posture.  Still a slight tendancy towards posterior lean, but not pushing back today.   Postural  control: Posterior lean     Standing balance comment: unable                   ADL Overall ADL's : Needs assistance/impaired Eating/Feeding: Total assistance   Grooming: Wash/dry hands;Wash/dry face;Brushing hair;Total assistance;Bed level;Sitting   Upper Body Bathing: Total assistance   Lower Body Bathing: Total assistance   Upper Body Dressing : Total assistance   Lower Body Dressing: Total assistance   Toilet Transfer: +2 for physical assistance;Squat-pivot;BSC;Cueing for safety Toilet Transfer Details (indicate cue type and reason): Pt incontinent of stool while sitting EOB.  Pt transferred to [facility] with pt assisting ~15% with +2 assist and a third person to stabilize [facility] Toileting- Clothing Manipulation and Hygiene: +2 for physical assistance;Sitting/lateral lean Toileting - Clothing Manipulation Details (indicate cue type and reason): Attempted to move into standing, but pt resists movement.  Leaned/scooted pt forward to access peri anal area for hygiene     Functional mobility during ADLs: +2 for physical assistance General ADL Comments: Pt continues to be unable to engage in ADL activities.  She was able to assist minimally with transfer to [facility], but unable to assist further requiring use of maxi move lift      Vision                     Perception     Praxis      Cognition   Behavior During Therapy: Flat affect;Anxious;Restless Overall Cognitive Status: Impaired/Different from baseline Area of Impairment: Orientation;Attention;Memory;Following commands;Safety/judgement;Awareness;Problem solving Orientation Level: Disoriented to;Time;Place Current Attention Level: Focused Memory: Decreased recall of precautions  Following Commands: Follows one step commands inconsistently Safety/Judgement: Decreased awareness of safety;Decreased awareness of deficits Awareness: Intellectual Problem Solving: Slow processing;Decreased initiation;Difficulty  sequencing;Requires verbal cues;Requires tactile cues General Comments: Per OT, pt less anxious today, although after initial transfer, became more so as she fatigued and pain increased.  Eyes closed the majority of the session.      Extremity/Trunk Assessment               Exercises     Shoulder Instructions       General Comments      Pertinent Vitals/ Pain       Pt calm with no indication of pain.  She did holler out, but appears to be associated with fear of moving.    Home Living                                          Prior Functioning/Environment              Frequency Min 2X/week     Progress Toward Goals  OT Goals(current goals can now be found in the care plan section)  Progress towards OT goals: Progressing toward goals  Acute Rehab OT Goals Patient Stated Goal: Decrease pain ADL Goals Pt Will Perform Grooming: with min assist;sitting Pt Will Transfer to Toilet: with mod assist;stand pivot transfer;bedside commode Pt Will Perform Toileting -  Clothing Manipulation and hygiene: with max assist;sit to/from stand Additional ADL Goal #1: Pt will perform bed mobility with min A to assist with BADLs Additional ADL Goal #2: Pt will sit EOB with supervision x 10 mins for assist with BADLs  Plan Discharge plan remains appropriate    Co-evaluation    PT/OT/SLP Co-Evaluation/Treatment: Yes Reason for Co-Treatment: For patient/therapist safety;Complexity of the patient's impairments (multi-system involvement) PT goals addressed during session: Mobility/safety with mobility;Balance;Strengthening/ROM OT goals addressed during session: Strengthening/ROM;ADL's and self-care      End of Session Equipment Utilized During Treatment: Other (comment) (maxi move and stedy lifts)   Activity Tolerance Patient limited by lethargy;Other (comment) (cognitive deficits)   Patient Left in chair;with call bell/phone within reach;with nursing/sitter in  room;with family/visitor present   Nurse Communication Mobility status;Need for lift equipment        Time: 1749-4496 OT Time Calculation (min): 35 min  Charges: OT General Charges $OT Visit: 1 Procedure OT Treatments $Self Care/Home Management : 8-22 mins  Basya Casavant M Samyuktha Brau 08/24/2013, 2:11 PM

## 2013-08-24 NOTE — Progress Notes (Signed)
Subjective: Patient reports No complaints somewhat confused and poor story and this evening. Reportedly with physical therapy she status out of bed made some attempts to get up and did show some improvement from yesterday however was not able to take any steps. She did get somewhat agitated and confused and had it put back to bed. She currently is denying any pain.  Objective: Vital signs in last 24 hours: Temp:  [97.6 F (36.4 C)-99.5 F (37.5 C)] 99.3 F (37.4 C) (05/04 1718) Pulse Rate:  [94-103] 94 (05/04 1718) Resp:  [18-20] 20 (05/04 1718) BP: (118-153)/(43-88) 121/43 mmHg (05/04 1718) SpO2:  [94 %-98 %] 98 % (05/04 1718)  Intake/Output from previous day: 05/03 0701 - 05/04 0700 In: 1125 [P.O.:300; I.V.:825] Out: 350 [Urine:350] Intake/Output this shift:    Left lower extremity weakness difficulty grade as the patient is not compliant with the exam did see spontaneous antigravity movement proximally did not witness any dorsiflexion or plantar flexion. Right looks to me seems to move 5 out of 5  Lab Results:  Recent Labs  08/23/13 2040  WBC 10.1  HGB 10.1*  HCT 29.8*  PLT 218   BMET  Recent Labs  08/23/13 2040  NA 137  K 3.7  CL 101  CO2 22  GLUCOSE 123*  BUN 13  CREATININE 0.63  CALCIUM 8.5    Studies/Results: Dg Abd 1 View  08/23/2013   CLINICAL DATA:  Abdominal pain.  EXAM: ABDOMEN - 1 VIEW  COMPARISON:  None.  FINDINGS: Normal bowel gas pattern. Lumbar spine degenerative changes. The examination is limited by pronounced grid lines.  IMPRESSION: No acute abnormality.   Electronically Signed   By: Enrique Sack M.D.   On: 08/23/2013 20:01   Ct Lumbar Spine Wo Contrast  08/23/2013   CLINICAL DATA:  Previous multilevel laminectomy earlier this week. Patient will significant left leg weakness with severe pain in her legs. MRI demonstrated a large epidural compressive hematoma. This was evacuated yesterday. Postop evaluation for residual hematoma.  EXAM: CT LUMBAR  SPINE WITHOUT CONTRAST  TECHNIQUE: Multidetector CT imaging of the lumbar spine was performed without intravenous contrast administration. Multiplanar CT image reconstructions were also generated.  COMPARISON:  MRI, 08/22/2013  FINDINGS: There is now postoperative air along the laminectomy defects from L2 through the L4-L5 level, extending to the posterior dural margin. The compression of the thecal sac noted on the previous day's MRI has been reduced. The AP dimension of the thecal sac at L2 measures 8.3 mm. It is narrowest at the L2-L3 level where it measures 5 mm.  There is no evidence of a residual epidural or paraspinal hematoma. No discrete fluid collection is seen.  Bilateral laminectomies have been performed at L2, L3 and L4. There is a right hemilaminectomy at L5.  Degenerative disc bulging and endplate spurring is noted throughout the visualized time, unchanged from the previous day's MRI. Depression of the upper endplates of L3 and L4 is also stable from the prior MRI.  Degenerative disc air is noted at L1-L2 and L4-5. There is no endplate resorption or other areas of bone resorption to suggest osteomyelitis or discitis.  IMPRESSION: 1. Status post evacuation of a large posterior epidural hematoma of the lumbar spine. No residual hematoma is present. Air now lies along the laminectomy defects from L2 through the L4-L5 level. 2. Compression of the thecal sac has decreased subsequent to the hematoma evacuation.   Electronically Signed   By: Lajean Manes M.D.   On:  08/23/2013 12:48   Mr Lumbar Spine Wo Contrast  08/22/2013   CLINICAL DATA:  Sudden onset of acute left leg weakness and increasing back pain. Previous surgery 08/17/2013.  EXAM: MRI LUMBAR SPINE WITHOUT CONTRAST  TECHNIQUE: Multiplanar, multisequence MR imaging of the lumbar spine was performed. No intravenous contrast was administered.  COMPARISON:  DG LUMBAR SPINE 1 VIEW dated 08/17/2013; MR LUMBAR SPINE W/O CM dated 07/25/2013  FINDINGS: The  patient underwent L2, L3 and L4 laminectomies with bilateral L1 laminotomies for lumbar decompression on 08/17/2013. There is a mixed signal intensity fluid collection within the surgical bed measuring 14 x 45 x 84 mm with areas of increased and decreased T1 and T2 signal abnormality. There is moderately severe compression of the thecal sac from L1-2 through L4-5, maximal at L2-L3. Findings are consistent with a postoperative epidural hematoma.  No evidence for CSF leak. No subcutaneous fluid collection. No postoperative instability or subluxation. Lumbar intervertebral discs demonstrate moderate to severe degeneration from L1-2 through L5-S1, unchanged from 07/25/2013.  IMPRESSION: 14 x 45 x 84 mm mixed signal intensity fluid collection compressing the thecal sac consistent with a postoperative epidural hematoma. Ordering provider aware of findings.   Electronically Signed   By: Rolla Flatten M.D.   On: 08/22/2013 21:48    Assessment/Plan: Continue mobilization physical therapy I suspect confusion is related to medication she is currently getting intermittent allotted oxycodone and Valium to a tablet I discontinued all of them. We'll leave her only on tramadol for pain and recheck her electrolytes the morning.  LOS: 7 days     Elaina Hoops 08/24/2013, 5:35 PM

## 2013-08-24 NOTE — Progress Notes (Signed)
Physical Therapy Treatment Patient Details Name: Katherine Rubio MRN: [id number] DOB: Oct 20, 1922 Today's Date: 08/24/2013    History of Present Illness 80 year old white female who fell and suffered thoracic fractures in the past (no dates available). Pt presented with sever lumbar stenosis (segements L2-3, L3-4 and L4-5). Pt s/p L1-L5 laminectomies and decompression (08/17/13). Pt found to have a Postoperative lumbar epidural hematoma  as well as pseudomengingocele and underwent lumbar exploration and evacuation of hematoms 08/22/13    PT Comments    Despite lethargy, pt was able to make some limited progress with her mobility.  She was able to sit EOB with less support today and she did transfer once without the lift to the [facility] with three person assist (two to move her, one to stabilize the commode).  We were unable to, however, get her from the [facility] back on her feet enough to transfer, so the maxi move total lift was used to get her to the chair.  This is her first time OOB to chair since she has been in the hospital.  PT will continue to follow acutely and progress as able.    Follow Up Recommendations  SNF     Equipment Recommendations  Hospital bed (hoyer lift)    Recommendations for Other Services   NA     Precautions / Restrictions Precautions Precautions: Fall;Back Precaution Comments: Pt unable to recall or follow back precautions  Required Braces or Orthoses: Spinal Brace Spinal Brace: Lumbar corset;Applied in sitting position    Mobility  Bed Mobility Overal bed mobility: +2 for physical assistance Bed Mobility: Rolling;Sidelying to Sit Rolling: Max assist;+2 for physical assistance Sidelying to sit: Max assist;+2 for physical assistance       General bed mobility comments: Pt able to pull on bed rail to assist with rolling and able to assist with lifting shoulder up to EOB.  She was able to scoot her hips forward to EOB with mod A and max verbal cues. Pt needed  hand over hand assist to initially find the bedrail and both verbal and tactile assist to preform log roll correctly.  Less posterior pushing and less resistance to transitions than described in last notes.   Transfers Overall transfer level: Needs assistance Equipment used: 2 person hand held assist Transfers: Sit to/from [employer] Sit to Stand: Total assist;+2 physical assistance   Squat pivot transfers: Total assist;+2 physical assistance     General transfer comment: Pt incontinent of bowel while sitting EOB.  Pt moved sit to partial stand x 1 with total A +2 for hygiene.  Pt indicating that she needed to have further BM and was transferred to [facility] with total A +2 and a third person to stabillize [facility].  Attempted x 3 to move pt to standing from [facility] without success as pt becomes limp and does not attempt to bear weight through LEs.  Attempted to use stedy lift withoug success.  Pt transferred from [facility] to recliner using maxi move. Bil knees blocked for transfers and attempts at standing from both elevated bed and elevated BSC.  Pt would start initiating the movement well with forward translation of her trunk and pushing with bil hands from arm rests on BSC, but there seemed to be little to no initiation to push from either leg.  As therapists would progress to lifting her buttocks she would lose her hand grips bil and start clutching at any object she could reach for.    Ambulation/Gait  General Gait Details: unable at this time          Balance Overall balance assessment: Needs assistance Sitting-balance support: Feet supported Sitting balance-Leahy Scale: Fair Sitting balance - Comments: Much better sitting balance today.  Good upright posture.  Still a slight tendancy towards posterior lean, but not pushing back today.   Postural control: Posterior lean     Standing balance comment: unable                    Cognition Arousal/Alertness:  Lethargic;Suspect due to medications Behavior During Therapy: Flat affect;Anxious;Restless Overall Cognitive Status: Impaired/Different from baseline Area of Impairment: Orientation;Attention;Memory;Following commands;Safety/judgement;Awareness;Problem solving Orientation Level: Disoriented to;Time;Place Current Attention Level: Focused Memory: Decreased recall of precautions Following Commands: Follows one step commands inconsistently Safety/Judgement: Decreased awareness of safety;Decreased awareness of deficits Awareness: Intellectual Problem Solving: Slow processing;Decreased initiation;Difficulty sequencing;Requires verbal cues;Requires tactile cues General Comments: Per OT, pt less anxious today, although after initial transfer, became more so as she fatigued and pain increased.  Eyes closed the majority of the session.         General Comments General comments (skin integrity, edema, etc.): Aid from home in room assisting with equipment.  RN tech assisting with toileting, so ended up being a +3 assist.        Pertinent Vitals/Pain See vitals flow sheet.            PT Goals (current goals can now be found in the care plan section) Acute Rehab PT Goals Patient Stated Goal: Decrease pain Progress towards PT goals: Progressing toward goals    Frequency  Min 5X/week    PT Plan Frequency needs to be updated    Co-evaluation PT/OT/SLP Co-Evaluation/Treatment: Yes Reason for Co-Treatment: For patient/therapist safety;Complexity of the patient's impairments (multi-system involvement) PT goals addressed during session: Mobility/safety with mobility;Balance;Strengthening/ROM OT goals addressed during session: Strengthening/ROM;ADL's and self-care     End of Session Equipment Utilized During Treatment: Back brace Activity Tolerance: Patient limited by fatigue;Patient limited by pain;Patient limited by lethargy Patient left: in chair;with call bell/phone within reach;with  family/visitor present     Time: 0981-1914 PT Time Calculation (min): 35 min  Charges:  $Therapeutic Activity: 8-22 mins                      Lashon Beringer B. [place], [place], DPT [phone]   08/24/2013, 2:06 PM

## 2013-08-25 ENCOUNTER — Encounter (HOSPITAL_COMMUNITY): Payer: Self-pay | Admitting: Neurological Surgery

## 2013-08-25 NOTE — Progress Notes (Signed)
Subjective: Patient reports Overall she gets some back pain but she feels like her leg is getting better  Objective: Vital signs in last 24 hours: Temp:  [97.7 F (36.5 C)-99.3 F (37.4 C)] 98.8 F (37.1 C) (05/05 0600) Pulse Rate:  [80-98] 80 (05/05 0600) Resp:  [16-20] 16 (05/05 0600) BP: (119-126)/(43-92) 126/51 mmHg (05/05 0600) SpO2:  [94 %-99 %] 98 % (05/05 0600)  Intake/Output from previous day:   Intake/Output this shift:    Much more appropriate this morning awake alert confusion is significantly improved. Movement in left lower extremity is also significant improvement she now has 3/5 dorsiflexion plantar flexion and 4 minus out of 5 quadriceps strength and left leg  Lab Results:  Recent Labs  08/23/13 2040 08/24/13 1950  WBC 10.1 7.7  HGB 10.1* 8.9*  HCT 29.8* 26.4*  PLT 218 193   BMET  Recent Labs  08/23/13 2040 08/24/13 1950  NA 137 138  K 3.7 4.1  CL 101 105  CO2 22 22  GLUCOSE 123* 102*  BUN 13 22  CREATININE 0.63 0.83  CALCIUM 8.5 8.2*    Studies/Results: Dg Abd 1 View  08/23/2013   CLINICAL DATA:  Abdominal pain.  EXAM: ABDOMEN - 1 VIEW  COMPARISON:  None.  FINDINGS: Normal bowel gas pattern. Lumbar spine degenerative changes. The examination is limited by pronounced grid lines.  IMPRESSION: No acute abnormality.   Electronically Signed   By: Enrique Sack M.D.   On: 08/23/2013 20:01   Ct Lumbar Spine Wo Contrast  08/23/2013   CLINICAL DATA:  Previous multilevel laminectomy earlier this week. Patient will significant left leg weakness with severe pain in her legs. MRI demonstrated a large epidural compressive hematoma. This was evacuated yesterday. Postop evaluation for residual hematoma.  EXAM: CT LUMBAR SPINE WITHOUT CONTRAST  TECHNIQUE: Multidetector CT imaging of the lumbar spine was performed without intravenous contrast administration. Multiplanar CT image reconstructions were also generated.  COMPARISON:  MRI, 08/22/2013  FINDINGS: There is now  postoperative air along the laminectomy defects from L2 through the L4-L5 level, extending to the posterior dural margin. The compression of the thecal sac noted on the previous day's MRI has been reduced. The AP dimension of the thecal sac at L2 measures 8.3 mm. It is narrowest at the L2-L3 level where it measures 5 mm.  There is no evidence of a residual epidural or paraspinal hematoma. No discrete fluid collection is seen.  Bilateral laminectomies have been performed at L2, L3 and L4. There is a right hemilaminectomy at L5.  Degenerative disc bulging and endplate spurring is noted throughout the visualized time, unchanged from the previous day's MRI. Depression of the upper endplates of L3 and L4 is also stable from the prior MRI.  Degenerative disc air is noted at L1-L2 and L4-5. There is no endplate resorption or other areas of bone resorption to suggest osteomyelitis or discitis.  IMPRESSION: 1. Status post evacuation of a large posterior epidural hematoma of the lumbar spine. No residual hematoma is present. Air now lies along the laminectomy defects from L2 through the L4-L5 level. 2. Compression of the thecal sac has decreased subsequent to the hematoma evacuation.   Electronically Signed   By: Lajean Manes M.D.   On: 08/23/2013 12:48    Assessment/Plan: Progressive mobilization today with physical therapy since it had significant proven on last 24 hours and both mental status and strength in left lower extremity.  LOS: 8 days     Katherine Rubio  Katherine Rubio 08/25/2013, 7:34 AM

## 2013-08-25 NOTE — Progress Notes (Signed)
Patient diet downgraded to Dysphagia III per family request as they hope this will encourage patient to eat more (and have less energy spent chewing). Will cont to monitor

## 2013-08-25 NOTE — Progress Notes (Signed)
Physical Therapy Treatment Patient Details Name: Katherine Rubio MRN: [id number] DOB: 10-Mar-1923 Today's Date: 08/25/2013    History of Present Illness 80 year old white female who fell and suffered thoracic fractures in the past (no dates available). Pt presented with sever lumbar stenosis (segements L2-3, L3-4 and L4-5). Pt s/p L1-L5 laminectomies and decompression (08/17/13). Pt found to have a Postoperative lumbar epidural hematoma  as well as pseudomengingocele and underwent lumbar exploration and evacuation of hematoms 08/22/13    PT Comments    Pt a little more alert today, able to power up with two person assist to fully upright standing at EOB.  Progressing slowly, but now consistently with therapy.  I continue to endorse SNF for rehab before returning home as the pt continues to be a heavy two person assist for transfers and most mobility.  PT to continue to follow acutely.      Follow Up Recommendations  SNF     Equipment Recommendations  Wheelchair (measurements PT);Wheelchair cushion (measurements PT);Hospital bed;Other (comment) (hoyer lift)    Recommendations for Other Services   NA     Precautions / Restrictions Precautions Precautions: Fall;Back Required Braces or Orthoses: Spinal Brace Spinal Brace: Lumbar corset;Applied in sitting position Restrictions Weight Bearing Restrictions: No    Mobility  Bed Mobility Overal bed mobility: Needs Assistance Bed Mobility: Rolling;Sidelying to Sit Rolling: Mod assist Sidelying to sit: +2 for physical assistance;Mod assist       General bed mobility comments: Mod assist to help hip get all the way to sidelying when rolling.  Manual assist needed to help pt find bedrail, but once hands were there she was able to pull her upper body over,  Two person mod assist needed to help with transition of trunk up to sitting.  Pt was helping once motion was initiated by therapist by using her upper extremities still on bed rail    Transfers Overall transfer level: Needs assistance Equipment used: 2 person hand held assist Transfers: Sit to/from [employer] Transfers Sit to Stand: +2 physical assistance;Max assist   Squat pivot transfers: +2 physical assistance;Max assist     General transfer comment: Pt better able to power up to fully upright standing today from elevated bed.  Assisted with bil knees blocked, pt holding the bil therapists and bed pad used to help with hip extension.  She is unable to maintain upright posture for long once standing and sinks back into hips flexed posture.  Pivoted to her stronger right side from elevated bed with support of her trunk and hips using bed pad again.  Two person assist requred to complete the transfer from elevated bed safely.  Pt needed to urinate once in the chair.  Multiple mini squats preformed from lower recliner for pericare and trade out of wet pads.  As pt fatigued and from lower surface, pt was +2 total assit.   Ambulation/Gait             General Gait Details: unable at this time          Balance Overall balance assessment: Needs assistance Sitting-balance support: Feet supported;Bilateral upper extremity supported Sitting balance-Leahy Scale: Fair Sitting balance - Comments: at times pt leaning forward to rest her head on the therapist and still very fearful of falling when sitting EOB.     Standing balance support: Bilateral upper extremity supported Standing balance-Leahy Scale: Zero Standing balance comment: continues to require two person max to total support with knees blocked bil in standing.  Cognition Arousal/Alertness: Lethargic (but better than last session) Behavior During Therapy: Flat affect;Anxious Overall Cognitive Status: Impaired/Different from baseline Area of Impairment: Orientation;Attention;Memory;Following commands;Safety/judgement;Awareness;Problem solving Orientation Level: Disoriented  to;Time;Place Current Attention Level: Focused Memory: Decreased recall of precautions;Decreased short-term memory Following Commands: Follows one step commands inconsistently;Follows one step commands with increased time Safety/Judgement: Decreased awareness of safety;Decreased awareness of deficits Awareness: Intellectual Problem Solving: Slow processing;Decreased initiation;Difficulty sequencing;Requires verbal cues;Requires tactile cues General Comments: Pt more alert today, but not oriented.  Better participation, but need verbal and tactile cues for initiating any movement.  Pt intenally distracted by pain.         General Comments General comments (skin integrity, edema, etc.): I still did not observe any active motion in her left leg and left leg continues to be the weaker of the two legs.        Pertinent Vitals/Pain See vitals flow sheet.            PT Goals (current goals can now be found in the care plan section) Acute Rehab PT Goals Patient Stated Goal: decrease pain Progress towards PT goals: Progressing toward goals    Frequency  Min 5X/week    PT Plan Current plan remains appropriate       End of Session Equipment Utilized During Treatment: Back brace;Gait belt Activity Tolerance: Patient limited by fatigue;Patient limited by pain Patient left: in chair;with call bell/phone within reach;Other (comment) (with her RN aide from home in room)     Time: 1040-1107 PT Time Calculation (min): 27 min  Charges:  $Therapeutic Activity: 23-37 mins                      Levette Paulick B. [place], [place], DPT [phone]   08/25/2013, 12:58 PM

## 2013-08-25 NOTE — Progress Notes (Signed)
Back put back to bed with 1 assist and Maxi Lift. Patient positioned in bed, resting comfortably by the time i left room . Safety aid at bedside, call bell in reach

## 2013-08-26 NOTE — Progress Notes (Signed)
Patient requesting to get out of bed, PT paged

## 2013-08-26 NOTE — Progress Notes (Signed)
Physical Therapy Treatment Patient Details Name: Katherine Rubio MRN: [id number] DOB: Mar 02, 1923 Today's Date: 08/26/2013    History of Present Illness 80 year old white female who fell and suffered thoracic fractures in the past (no dates available). Pt presented with sever lumbar stenosis (segements L2-3, L3-4 and L4-5). Pt s/p L1-L5 laminectomies and decompression (08/17/13). Pt found to have a Postoperative lumbar epidural hematoma  as well as pseudomengingocele and underwent lumbar exploration and evacuation of hematoms 08/22/13    PT Comments    Pt progressing slowly with PT.  She was actually requesting to get OOB today which is a first.  She continues to need two person assist to stand and get OOB.  PT continues to recommend SNF level rehab at d/c.    Follow Up Recommendations  SNF     Equipment Recommendations  Wheelchair (measurements PT);Wheelchair cushion (measurements PT);Other (comment) (hoyer lift)    Recommendations for Other Services    NA     Precautions / Restrictions Precautions Precautions: Fall;Back Required Braces or Orthoses: Spinal Brace Spinal Brace: Lumbar corset;Applied in sitting position    Mobility  Bed Mobility Overal bed mobility: Needs Assistance Bed Mobility: Rolling;Sidelying to Sit Rolling: Mod assist Sidelying to sit: Mod assist       General bed mobility comments: Mod assist to support trunk and help he reach for railing during transitions.  Pt continues to be mildly resistant to movement as we come up to sitting and initially, before feet touch the floor, pushes posteriorly.  She does better if you give her time to try to scoot to the EOB on her own, otherwise she will push posteriorly.    Transfers Overall transfer level: Needs assistance Equipment used: 2 person hand held assist Transfers: Sit to/from [employer] Sit to Stand: +2 physical assistance;Max assist;From elevated surface   Squat pivot transfers: +2  physical assistance;Max assist;From elevated surface     General transfer comment: Pt needed support at trunk and ischiums to get to more upright standing.  Needs cues for hand placement and often stops holding her original hand placement.  We transferred to her right stronger side.    Ambulation/Gait             General Gait Details: unable        Balance Overall balance assessment: Needs assistance Sitting-balance support: Feet supported;Bilateral upper extremity supported Sitting balance-Leahy Scale: Poor Sitting balance - Comments: posterior push today min assist to prevent LOB.  Postural control: Posterior lean Standing balance support: Bilateral upper extremity supported Standing balance-Leahy Scale: Zero                      Cognition Arousal/Alertness: Lethargic Behavior During Therapy: Anxious;Flat affect Overall Cognitive Status: Impaired/Different from baseline Area of Impairment: Orientation;Attention;Memory;Following commands;Safety/judgement;Awareness;Problem solving Orientation Level: Disoriented to;Time;Place Current Attention Level: Focused Memory: Decreased recall of precautions;Decreased short-term memory Following Commands: Follows one step commands with increased time Safety/Judgement: Decreased awareness of safety;Decreased awareness of deficits Awareness: Intellectual Problem Solving: Slow processing;Decreased initiation;Difficulty sequencing;Requires verbal cues;Requires tactile cues General Comments: Pt continues to be anxious, more so when pain kicks in, but encouraging that she was asking to be OOB today.         General Comments General comments (skin integrity, edema, etc.): Personal aide helping with treatment today.       Pertinent Vitals/Pain See vitals flow sheet.            PT Goals (current goals can now be found  in the care plan section) Acute Rehab PT Goals Patient Stated Goal: decrease pain Progress towards PT  goals: Progressing toward goals    Frequency  Min 5X/week    PT Plan Current plan remains appropriate       End of Session Equipment Utilized During Treatment: Back brace;Gait belt Activity Tolerance: Patient limited by fatigue;Patient limited by lethargy;Patient limited by pain Patient left: in chair;with call bell/phone within reach;with nursing/sitter in room     Time: 1226-1244 PT Time Calculation (min): 18 min  Charges:  $Therapeutic Activity: 8-22 mins                      Keniya Schlotterbeck B. [place], [place], DPT [phone]   08/26/2013, 2:27 PM

## 2013-08-26 NOTE — Progress Notes (Signed)
Patient ID: Katherine Rubio, female   DOB: 1922/05/05, 80 y.o.   MRN: [id number] Subjective:  The patient is mildly somnolent but easily arousable. She is pleasant when awoken.  Objective: Vital signs in last 24 hours: Temp:  [97.6 F (36.4 C)-99.2 F (37.3 C)] 97.6 F (36.4 C) (05/06 0500) Pulse Rate:  [58-83] 58 (05/06 0500) Resp:  [18] 18 (05/06 0500) BP: (115-158)/(60-89) 154/89 mmHg (05/06 0500) SpO2:  [97 %-100 %] 100 % (05/06 0500)  Intake/Output from previous day: 05/05 0701 - 05/06 0700 In: 170 [P.O.:170] Out: -  Intake/Output this shift:    Physical exam the patient is, but easily arousable. Her strength is at least 4/5 in her left quadriceps and gastrocnemius, she continues to give away occasionally. Her incision is healing well.  Lab Results:  Recent Labs  08/23/13 2040 08/24/13 1950  WBC 10.1 7.7  HGB 10.1* 8.9*  HCT 29.8* 26.4*  PLT 218 193   BMET  Recent Labs  08/23/13 2040 08/24/13 1950  NA 137 138  K 3.7 4.1  CL 101 105  CO2 22 22  GLUCOSE 123* 102*  BUN 13 22  CREATININE 0.63 0.83  CALCIUM 8.5 8.2*    Studies/Results: No results found.  Assessment/Plan: Postop day #9/4: I have again encouraged the patient to ambulate. I have spoken to [name] her health care power of attorney.  LOS: 9 days     [name] 08/26/2013, 9:20 AM

## 2013-08-27 LAB — GLUCOSE, CAPILLARY: Glucose-Capillary: 104 mg/dL — ABNORMAL HIGH (ref 70–99)

## 2013-08-27 NOTE — Progress Notes (Signed)
Physical Therapy Treatment Patient Details Name: Katherine Rubio MRN: [id number] DOB: 1922/10/02 Today's Date: 08/27/2013    History of Present Illness 80 year old white female who fell and suffered thoracic fractures in the past (no dates available). Pt presented with sever lumbar stenosis (segements L2-3, L3-4 and L4-5). Pt s/p L1-L5 laminectomies and decompression (08/17/13). Pt found to have a Postoperative lumbar epidural hematoma  as well as pseudomengingocele and underwent lumbar exploration and evacuation of hematoms 08/22/13    PT Comments    Pt is not making progress.  She is unable to even stand at the EOB with two person heavy assist and the RW and we can therefore not progress her to walk any.  We have tried various lift machines, but she continues to be two person heavy assist and her eyes are closed the entire time.    Follow Up Recommendations  SNF     Equipment Recommendations  Wheelchair (measurements PT);Wheelchair cushion (measurements PT);Hospital bed;Other (comment) (hoyer lift, ambulance transport home.)    Recommendations for Other Services       Precautions / Restrictions Precautions Precautions: Fall;Back Required Braces or Orthoses: Spinal Brace Spinal Brace: Lumbar corset;Applied in sitting position    Mobility  Bed Mobility Overal bed mobility: Needs Assistance Bed Mobility: Rolling;Sidelying to Sit Rolling: Mod assist Sidelying to sit: Max assist       General bed mobility comments: Mod assist to progress hips all the way over to side lying.  Max assist to progress bil legs and support trunk to get to sitting.    Transfers Overall transfer level: Needs assistance Equipment used: Rolling walker (2 wheeled);2 person hand held assist Transfers: Sit to/from [employer] Sit to Stand: +2 physical assistance;Max assist;From elevated surface   Squat pivot transfers: +2 physical assistance;Max assist     General transfer comment:  Attempted to stand with both RW and with two person assist as MD would like pt to start walking.  Pt is unable to stand even from an elevated bed with RW and two person assist.  She is not powering up with her legs at all.  We had to move the walker and use two person assist to pivot her to the recliner chair.  He eyes are closed the entire time.    Ambulation/Gait             General Gait Details: Still unable despite two person assist and attempted RW use.          Balance Overall balance assessment: Needs assistance Sitting-balance support: Feet supported;Bilateral upper extremity supported Sitting balance-Leahy Scale: Poor Sitting balance - Comments: Pt needs assist to prevent scooting too far forward off of the EOB.  Her eyes are closed and she keeps scooting.     Standing balance support: Bilateral upper extremity supported Standing balance-Leahy Scale: Zero Standing balance comment: unable to get to fully upright standing today.                      Cognition Arousal/Alertness: Lethargic Behavior During Therapy: Restless Overall Cognitive Status: Impaired/Different from baseline Area of Impairment: Orientation;Attention;Following commands;Memory;Safety/judgement;Awareness;Problem solving Orientation Level: Disoriented to;Person;Place;Time;Situation Current Attention Level: Focused Memory: Decreased recall of precautions;Decreased short-term memory Following Commands: Follows one step commands inconsistently Safety/Judgement: Decreased awareness of safety;Decreased awareness of deficits Awareness: Intellectual Problem Solving: Slow processing;Decreased initiation;Difficulty sequencing;Requires verbal cues;Requires tactile cues General Comments: Pt's eyes closed 90% of session again today.  She is moaning and rolling around in the bed.  Aide reports she had to hold her in the bed this morning to keep her from rolling out.         General Comments General comments  (skin integrity, edema, etc.): Personal aide helping again with treatment today.  Even with two experienced caregivers the pt is unable to stand and take steps forward.        Pertinent Vitals/Pain See vitals flow sheet.            PT Goals (current goals can now be found in the care plan section) Acute Rehab PT Goals Patient Stated Goal: decrease pain and to go home.  Progress towards PT goals: Progressing toward goals    Frequency  Min 5X/week    PT Plan Current plan remains appropriate       End of Session Equipment Utilized During Treatment: Gait belt;Back brace Activity Tolerance: Patient limited by fatigue;Patient limited by lethargy;Patient limited by pain Patient left: in chair;with call bell/phone within reach;with nursing/sitter in room (personal aide from home. )     Time: 7425-9563 PT Time Calculation (min): 21 min  Charges:  $Therapeutic Activity: 8-22 mins                      Saretta Dahlem B. Shepherd, [place], DPT [phone]   08/27/2013, 11:09 AM

## 2013-08-27 NOTE — Progress Notes (Signed)
Patient ID: Katherine Rubio, female   DOB: 08/03/1922, 80 y.o.   MRN: [id number] Subjective:  The patient is, but easily arousable. She is in no apparent distress.  Objective: Vital signs in last 24 hours: Temp:  [97.7 F (36.5 C)-98.6 F (37 C)] 97.7 F (36.5 C) (05/07 0523) Pulse Rate:  [63-70] 70 (05/07 0523) Resp:  [18-20] 18 (05/07 0523) BP: (118-164)/(52-95) 118/80 mmHg (05/07 0523) SpO2:  [96 %-100 %] 98 % (05/07 0523)  Intake/Output from previous day: 05/06 0701 - 05/07 0700 In: 20 [P.O.:20] Out: -  Intake/Output this shift:    Physical exam the patient's wound is healing well. Her strength the best I can tell is grossly normal in her bilateral quadriceps and gastrocnemius.  Lab Results:  Recent Labs  08/24/13 1950  WBC 7.7  HGB 8.9*  HCT 26.4*  PLT 193   BMET  Recent Labs  08/24/13 1950  NA 138  K 4.1  CL 105  CO2 22  GLUCOSE 102*  BUN 22  CREATININE 0.83  CALCIUM 8.2*    Studies/Results: No results found.  Assessment/Plan: Postop day #10/5: I have once again encouraged the patient to get out of bed and ambulate. I've stressed the importance of ambulation to decrease the chance of DVT, pneumonia, bed sores, etc. All she has managed to do is get to the chair.  LOS: 10 days     [name] 08/27/2013, 7:38 AM

## 2013-08-27 NOTE — Progress Notes (Signed)
Thank you to Dr Reynaldo Minium for this referral via the long length of stay meeting.  Patient evaluated for community based chronic disease management services with [facility] as a benefit of patient's [employer]. Spoke with HCPOA/Nephew Ulice Bold by phone [phone]) to explain [facility] services as the patient is confused.  He will review the [facility] literature at bedside and decide if he would like [place] to engage.  Left contact information and [facility] literature at bedside. Made Inpatient Case Manager aware that [facility] following. Of note, [facility] Care Management services does not replace or interfere with any services that are arranged by inpatient case management or social work.  For additional questions or referrals please contact Corliss Blacker BSN RN [facility] Liaison at [phone].

## 2013-08-28 ENCOUNTER — Inpatient Hospital Stay (HOSPITAL_COMMUNITY): Payer: Medicare Other

## 2013-08-28 NOTE — Progress Notes (Signed)
Patient ID: KAILAH PENNEL, female   DOB: 06-13-1922, 80 y.o.   MRN: [id number] Subjective:  the patient is somnolent but easily arousable. She is pleasant. She has no complaints presently. By report at times she complains of back pain and is restless.  Objective: Vital signs in last 24 hours: Temp:  [97.7 F (36.5 C)-98.7 F (37.1 C)] 98 F (36.7 C) (05/08 0557) Pulse Rate:  [52-117] 52 (05/08 0557) Resp:  [18-20] 18 (05/08 0557) BP: (104-139)/(52-89) 113/80 mmHg (05/08 0557) SpO2:  [92 %-97 %] 94 % (05/08 0557)  Intake/Output from previous day:   Intake/Output this shift:    Physical exam the patient is somnolent but easily arousable. It is difficult to actually assess the strength in her lower extremities as she gives away easily. She appears to have at least 4/5 strength in her bilateral quadriceps and gastrocnemius. Her wound is healing well without signs of infection.  Lab Results: No results found for this basename: WBC, HGB, HCT, PLT,  in the last 72 hours BMET No results found for this basename: NA, K, CL, CO2, GLUCOSE, BUN, CREATININE, CALCIUM,  in the last 72 hours  Studies/Results: No results found.  Assessment/Plan: Postop day #11/6: I will speak with [name], the patient's healthcare power of attorney, to see if they want to consider nursing home placement. I'll get a head CT to evaluate her mental status changes, although my suspicion is not high since she has a nonfocal exam.   LOS: 11 days     [name] 08/28/2013, 7:53 AM

## 2013-08-28 NOTE — Progress Notes (Signed)
Physical Therapy Treatment Patient Details Name: Katherine Rubio MRN: [id number] DOB: 11-08-1922 Today's Date: 08/28/2013    History of Present Illness 80 year old white female who fell and suffered thoracic fractures in the past (no dates available). Pt presented with sever lumbar stenosis (segements L2-3, L3-4 and L4-5). Pt s/p L1-L5 laminectomies and decompression (08/17/13). Pt found to have a Postoperative lumbar epidural hematoma  as well as pseudomengingocele and underwent lumbar exploration and evacuation of hematoms 08/22/13    PT Comments    Pt making some very minor improvements with continued therapy, but it is extremely slow.  She is limited by lethargy and weakness.  PT continues to recommend SNF placement as she is unable to walk and I do not foresee her being able to walk unless the lethargy clears and even then, she has been immobile for so long it may take further therapy to get to the point where she can walk.  She continues to be two person max assist for standing and transfers to the chair.  PT will continue to follow acutely.  PT informed aide from home that they will need to request RN staff to lift her OOB to chair this weekend using the maxi move total lift and PT will be back on Monday to work with her.    Follow Up Recommendations  SNF     Equipment Recommendations  Wheelchair (measurements PT);Wheelchair cushion (measurements PT);Hospital bed;Other (comment) (hoyer lift)    Recommendations for Other Services   NA     Precautions / Restrictions Precautions Precautions: Fall;Back Required Braces or Orthoses: Spinal Brace Spinal Brace: Lumbar corset;Applied in sitting position    Mobility  Bed Mobility Overal bed mobility: Needs Assistance Bed Mobility: Rolling;Sidelying to Sit Rolling: Mod assist Sidelying to sit: Max assist       General bed mobility comments: Pt needed asssit to reach for the railing, but helped a little once her hands were there.   Assist needed to slide legs all the way off of the bed and support trunk during transition to sitting.  Again, pt helping a little with arms and movement of legs, but not enough to do the transfer without max assist.    Transfers Overall transfer level: Needs assistance Equipment used: 2 person hand held assist Transfers: Sit to/from Stand;Stand Pivot Transfers Sit to Stand: +2 physical assistance;Max assist Stand pivot transfers: +2 physical assistance;Max assist       General transfer comment: With assistance at trunk and manual assist at bil ischial tuberosities pt was able to get to fully upright standing with two person assist.  Once standing pt was unable to move feet to pivot to the chair and therapists had to manually turn feet and body to go to sitting in recliner chair.    Ambulation/Gait             General Gait Details: unable         Balance Overall balance assessment: Needs assistance Sitting-balance support: Feet supported;Bilateral upper extremity supported Sitting balance-Leahy Scale: Poor Sitting balance - Comments: pt reaching for support from therapist, clutching at cloting, has to be manually redirected to chair armrest and bed rail to help support herself in sitting.   Postural control: Right lateral lean (anterior and right) Standing balance support: Bilateral upper extremity supported Standing balance-Leahy Scale: Zero Standing balance comment: unable to stand without two person max assist.  Cognition Arousal/Alertness: Lethargic Behavior During Therapy: Restless Overall Cognitive Status: Impaired/Different from baseline Area of Impairment: Orientation;Attention;Following commands;Memory;Safety/judgement;Awareness;Problem solving Orientation Level: Place;Time;Situation Current Attention Level: Focused Memory: Decreased short-term memory Following Commands: Follows one step commands inconsistently Safety/Judgement:  Decreased awareness of safety;Decreased awareness of deficits Awareness: Intellectual Problem Solving: Slow processing;Decreased initiation;Difficulty sequencing;Requires verbal cues;Requires tactile cues General Comments: Pt with eyes closed most of session, only opens to loud commands (per aide she is very HOH).  Unable to maintain eyes open during session.  Not really answering questions appropraitely at all.        General Comments General comments (skin integrity, edema, etc.): Once sitting in recliner chair and facing the window, PT/OT tried to stumulate her to reorient her or get her to answer any questions.  Cold wash cloth to face did not perk her up at all.  Pt very lethargic.        Pertinent Vitals/Pain See vitals flow sheet.      PT Goals (current goals can now be found in the care plan section) Acute Rehab PT Goals Patient Stated Goal: Pt unable to state Time For Goal Achievement: 09/10/13 (updated yesterday) Potential to Achieve Goals: Fair Progress towards PT goals: Progressing toward goals (very slowly)    Frequency  Min 5X/week    T Plan Current plan remains appropriate    Co-evaluation   Reason for Co-Treatment: For patient/therapist safety PT goals addressed during session: Mobility/safety with mobility;Balance;Strengthening/ROM OT goals addressed during session: Strengthening/ROM;ADL's and self-care     End of Session Equipment Utilized During Treatment: Gait belt;Back brace Activity Tolerance: Patient limited by lethargy Patient left: in chair;with call bell/phone within reach;with nursing/sitter in room     Time: 1233-1256 PT Time Calculation (min): 23 min  Charges:  $Therapeutic Activity: 8-22 mins                     Nedra Mcinnis B. [place], [place], DPT [phone]   08/28/2013, 4:05 PM

## 2013-08-28 NOTE — Progress Notes (Signed)
Occupational Therapy Treatment Patient Details Name: Katherine Rubio MRN: [id number] DOB: 1923-01-31 Today's Date: 08/28/2013    History of present illness 80 year old white female who fell and suffered thoracic fractures in the past (no dates available). Pt presented with sever lumbar stenosis (segements L2-3, L3-4 and L4-5). Pt s/p L1-L5 laminectomies and decompression (08/17/13). Pt found to have a Postoperative lumbar epidural hematoma  as well as pseudomengingocele and underwent lumbar exploration and evacuation of hematoms 08/22/13   OT comments  Pt minutely improved today compared to last OT session - she was able to assist minimally with sit to stand, but continues to require max A +2 people to move into standing and is unable to pivot LEs during transfer.  She is still unable to engage in even simple self care activities such as combing her hair or washing her face.  She requires total/hand over hand assist and makes no attempt to assist.   She will need +2 caregiver assistance at all times if she returns home.  Continue to recommend SNF.   Follow Up Recommendations  SNF;Supervision/Assistance - 24 hour    Equipment Recommendations  Hospital bed;Wheelchair (measurements OT)    Recommendations for Other Services      Precautions / Restrictions Precautions Precautions: Fall;Back Required Braces or Orthoses: Spinal Brace Spinal Brace: Lumbar corset;Applied in sitting position       Mobility Bed Mobility Overal bed mobility: Needs Assistance Bed Mobility: Rolling;Sidelying to Sit Rolling: Mod assist Sidelying to sit: Max assist       General bed mobility comments: assists with rolling once her hand was guided toward the rail.   She assisted minimally with moving LEs off bed and did assist with lifting shoulders off bed   Transfers Overall transfer level: Needs assistance Equipment used: 2 person hand held assist Transfers: Sit to/from Stand;Stand Pivot Transfers Sit  to Stand: +2 physical assistance;Max assist;From elevated surface Stand pivot transfers: Max assist;+2 physical assistance       General transfer comment: Able to move pt into standing with assist/facilitation bil. Ischiums and bil. knees blocked.  Pt unable to advance/pivot feet for transfer thus requiring total A to do so    Balance Overall balance assessment: Needs assistance Sitting-balance support: Feet supported;Bilateral upper extremity supported Sitting balance-Leahy Scale: Poor Sitting balance - Comments: leans anteriorly.  Does not initiate balance reaction to correct balance loss   Standing balance support: Bilateral upper extremity supported Standing balance-Leahy Scale: Zero Standing balance comment: max A +2 to stand                    ADL Overall ADL's : Needs assistance/impaired Eating/Feeding: Total assistance   Grooming: Wash/dry hands;Wash/dry face;Brushing hair;Total assistance;Sitting Grooming Details (indicate cue type and reason): Hand over hand assist provided - pt did not attempt to assist despite max cues Upper Body Bathing: Total assistance   Lower Body Bathing: Total assistance   Upper Body Dressing : Total assistance   Lower Body Dressing: Total assistance   Toilet Transfer: +2 for physical assistance;Squat-pivot;BSC;Cueing for safety;Maximal assistance   Toileting- Clothing Manipulation and Hygiene: +2 for physical assistance;Sitting/lateral lean;Total assistance       Functional mobility during ADLs: +2 for physical assistance;Total assistance        Vision                     Perception     Praxis      Cognition   Behavior During Therapy: Restless Overall  Cognitive Status: Impaired/Different from baseline Area of Impairment: Orientation;Attention;Following commands;Memory;Safety/judgement;Awareness;Problem solving Orientation Level: Place;Time;Situation Current Attention Level: Focused Memory: Decreased short-term  memory  Following Commands: Follows one step commands inconsistently Safety/Judgement: Decreased awareness of safety;Decreased awareness of deficits   Problem Solving: Slow processing;Decreased initiation;Difficulty sequencing;Requires verbal cues;Requires tactile cues General Comments: Pt keeps eyes closed 95% of time.  Will open eyes on command and follows ~50% of one step commands to assist with mobility.  She does not answer questions appropritately and does not inititate tasks.     Extremity/Trunk Assessment               Exercises     Shoulder Instructions       General Comments      Pertinent Vitals/ Pain       Pt with no indication of pain   Home Living                                          Prior Functioning/Environment              Frequency Min 2X/week     Progress Toward Goals  OT Goals(current goals can now be found in the care plan section)  Progress towards OT goals: Goals drowngraded-see care plan (minutely )  Acute Rehab OT Goals Patient Stated Goal: Pt unable to state OT Goal Formulation: Patient unable to participate in goal setting Time For Goal Achievement: 09/04/13 Potential to Achieve Goals: Fair ADL Goals Pt Will Perform Grooming: with max assist;sitting Pt Will Transfer to Toilet: with max assist;stand pivot transfer Pt Will Perform Toileting - Clothing Manipulation and hygiene: with max assist;sit to/from stand Additional ADL Goal #1: Pt will perform all aspects of bed mobility with max A  Additional ADL Goal #2: Pt will sit EOB with minA x 10 mins in prep for BADLs   Plan Discharge plan remains appropriate    Co-evaluation    PT/OT/SLP Co-Evaluation/Treatment: Yes Reason for Co-Treatment: For patient/therapist safety;Complexity of the patient's impairments (multi-system involvement)   OT goals addressed during session: Strengthening/ROM;ADL's and self-care      End of Session Equipment Utilized During  Treatment: Gait belt   Activity Tolerance Patient limited by lethargy   Patient Left in chair;with call bell/phone within reach;with nursing/sitter in room (private sitter present)   Nurse Communication Mobility status;Need for lift equipment        Time: 1062-6948 OT Time Calculation (min): 23 min  Charges: OT General Charges $OT Visit: 1 Procedure OT Treatments $Therapeutic Activity: 8-22 mins  Weylin Plagge M Wylie Russon 08/28/2013, 2:10 PM

## 2013-08-29 LAB — URINALYSIS, ROUTINE W REFLEX MICROSCOPIC
Glucose, UA: NEGATIVE mg/dL
HGB URINE DIPSTICK: NEGATIVE
Ketones, ur: 15 mg/dL — AB
LEUKOCYTES UA: NEGATIVE
NITRITE: NEGATIVE
Protein, ur: NEGATIVE mg/dL
SPECIFIC GRAVITY, URINE: 1.028 (ref 1.005–1.030)
UROBILINOGEN UA: 2 mg/dL — AB (ref 0.0–1.0)
pH: 6 (ref 5.0–8.0)

## 2013-08-29 LAB — GLUCOSE, CAPILLARY: Glucose-Capillary: 83 mg/dL (ref 70–99)

## 2013-08-29 NOTE — Progress Notes (Signed)
Pt unable to void due to poor oral intake. Bladder scan performed at 1230; 97 ml. New peripheral IV  Site obtained and fluid restarted at 1232 infusing at 75 cc/hr. Remains with no urinary output at 1850. Straight cath performed with 150 ml, dark/amber returned. Sample sent to lab for urinalysis; unremarkable.

## 2013-08-29 NOTE — Progress Notes (Signed)
Clinical Social Work Department BRIEF PSYCHOSOCIAL ASSESSMENT 08/29/2013  Patient:  Katherine Rubio, Katherine Rubio     Account Number:  [id number]     Admit date:  08/17/2013  Clinical Social Worker:  Adair Laundry  Date/Time:  08/29/2013 01:45 PM  Referred by:  Physician  Date Referred:  08/29/2013 Referred for  SNF Placement   Other Referral:   Interview type:  Family Other interview type:   Spoke with pt nephew over the phone    PSYCHOSOCIAL DATA Living Status:  ALONE Admitted from facility:   Level of care:   Primary support name:  Ulice Bold Primary support relationship to patient:  FAMILY Degree of support available:   Pt has very good family support    CURRENT CONCERNS Current Concerns  Post-Acute Placement   Other Concerns:    SOCIAL WORK ASSESSMENT / PLAN CSW contacted pt Ned Grace to discuss SNF option at dc. Pt nephew informed CSW pt would not be going to SNF and has been set up for 7 weeks now with 24 hr care at home. Pt nephew is open to home health services at pt home and did want pt to be set up with [facility]. Pt nephew expressed frustation to CSW about experience pt and pt family has had at [facility] this stay. Pt nephew informed CSW he has not been updated as requested and has had to receive updates from paid caregivers that have been at pt bedside. CSW expressed apologies and provided with support and understanding. Pt nephew informed CSW that they plan to have pt return home on Monday because they do not feel as though she is receiving care that needs to be continued in the hospital. Pt son did request assistance with non-emergent ambulance transport, and CSW informed him that weekday CSW could assist with this.   Assessment/plan status:  Psychosocial Support/Ongoing Assessment of Needs Other assessment/ plan:   Information/referral to community resources:   SNF List Denied    PATIENT'S/FAMILY'S RESPONSE TO PLAN OF CARE: Pt family would like for pt  to return home at dc where she has 24 hr care.       [place], [place]

## 2013-08-29 NOTE — Progress Notes (Signed)
Stable. She is been taken to her house and not to the SNF. Spoke with her family

## 2013-08-30 NOTE — Progress Notes (Signed)
Patient ID: Katherine Rubio, female   DOB: 09-Aug-1922, 80 y.o.   MRN: [id number] Seems to be resting comfortably at the current time. Visit or with patient who identified herself as a friend of the family notes that she's been crying with pain substantially when awake. She feels she cries to exhaustion. Does not feel she has adequate pain medications. Patient has tramadol written for pain. Nothing stronger has been given in days.  Continue supportive care. Will observe for pain control issues.

## 2013-08-31 MED ORDER — TRAMADOL HCL 50 MG PO TABS: 50.0000 mg | ORAL_TABLET | Freq: Four times a day (QID) | ORAL | Status: DC | PRN

## 2013-08-31 MED ORDER — DSS 100 MG PO CAPS: 100.0000 mg | ORAL_CAPSULE | Freq: Two times a day (BID) | ORAL | Status: DC

## 2013-08-31 NOTE — Discharge Summary (Signed)
Physician Discharge Summary  Patient ID: Katherine Rubio MRN: [id number] DOB/AGE: 78/19/1924 78 y.o.  Admit date: 08/17/2013 Discharge date: 08/31/2013  Admission Diagnoses: Lumbar spinal stenosis, lumbago, lumbar radiculopathy, neurogenic claudication  Discharge Diagnoses: The same Active Problems:   Neurogenic claudication due to lumbar spinal stenosis   S/P lumbar laminectomy   Discharged Condition: fair  Hospital Course: I performed an L1-2, L2-3, L3-4 and L4-5 laminectomy on the patient on 08/17/2013. The patient's initial postoperative course was remarkable for some difficulty with pain management. She seemed to be progressing but on 08/22/13 was noted to be weak in her left lower extremity. A lumbar MRI scan was obtained which demonstrated a lumbar epidural hematoma. The patient was taken back to the OR for evacuation of this hematoma.  The remainder of the patient's postoperative course was somewhat unremarkable. We had PT and OT see the patient. The patient did not mobilize very well at all, despite the fact that she is moving her lower extremities well. We minimized the patient's sedation and pain medications. She is comfortable. The situation was thoroughly discussed with the patient and her family. I offered to look into rehabilitation versus skilled nursing facility placement. The patient and her family declined and they have  plenty of support at home. They elected to take the patient home with home PT, OT, et cetera. These arrangements were made and the home health are in place. She was discharged to home on 08/31/2013. I gave all the patient's instructions to her health care power of attorney, her nephew, [name]. I answered all his questions. Patient was discharged home on 08/31/2013.  Consults: PT, OT Significant Diagnostic Studies: Lumbar MRI Treatments: L1-2, L2-3, L3-4 and L4-5 laminectomy; evacuation of epidural hematoma Discharge Exam: Blood pressure 109/58, pulse  78, temperature 98.7 F (37.1 C), temperature source Oral, resp. rate 18, height 5\' 4"  (1.626 m), weight 82.4 kg (181 lb 10.5 oz), SpO2 94.00%. The patient is somnolent but easily arousable. She moves her lower extremities well. Her incision is healing well.  Disposition: Home  Discharge Orders   Future Orders Complete By Expires   Call MD for:  difficulty breathing, headache or visual disturbances  As directed    Call MD for:  extreme fatigue  As directed    Call MD for:  hives  As directed    Call MD for:  persistant dizziness or light-headedness  As directed    Call MD for:  persistant nausea and vomiting  As directed    Call MD for:  redness, tenderness, or signs of infection (pain, swelling, redness, odor or green/yellow discharge around incision site)  As directed    Call MD for:  severe uncontrolled pain  As directed    Call MD for:  temperature >100.4  As directed    Diet - low sodium heart healthy  As directed    Discharge instructions  As directed    Driving Restrictions  As directed    Increase activity slowly  As directed    Lifting restrictions  As directed    May shower / Bathe  As directed    No dressing needed  As directed        Medication List    STOP taking these medications       alendronate 70 MG tablet  Commonly known as:  FOSAMAX     diazepam 2 MG tablet  Commonly known as:  VALIUM     diphenhydrAMINE 25 MG tablet  Commonly known as:  [place]     PERCOCET 5-325 MG per tablet  Generic drug:  oxyCODONE-acetaminophen      TAKE these medications       bisoprolol-hydrochlorothiazide 2.5-6.25 MG per tablet  Commonly known as:  ZIAC  TAKE 1 TABLET BY MOUTH DAILY     CALTRATE 600+D 600-400 MG-UNIT per tablet  Generic drug:  Calcium Carbonate-Vitamin D  Take 1 tablet by mouth daily.     DSS 100 MG Caps  Take 100 mg by mouth 2 (two) times daily.     losartan 25 MG tablet  Commonly known as:  COZAAR  Take 12.5 mg by mouth daily. Hold for sbp<100      pravastatin 20 MG tablet  Commonly known as:  PRAVACHOL  Take 1 tablet (20 mg total) by mouth at bedtime.     sertraline 50 MG tablet  Commonly known as:  ZOLOFT  Take 1 tablet (50 mg total) by mouth daily.     traMADol 50 MG tablet  Commonly known as:  ULTRAM  Take 1 tablet (50 mg total) by mouth every 12 (twelve) hours as needed.     traMADol 50 MG tablet  Commonly known as:  ULTRAM  Take 1 tablet (50 mg total) by mouth every 6 (six) hours as needed for moderate pain.     ZANTAC PO  Take 1 tablet by mouth daily as needed. For heartburn.         Signed: [name] 08/31/2013, 11:22 AM

## 2013-08-31 NOTE — Progress Notes (Signed)
CSW informed that pt will need a non emergency ambulance home today once DME is delivered to pt home (between 4p-8p today.) CSW placed medical necessity/PTAR form in pt shadow chart for RN to call transport when pt is ready for dc.CSW confirmed address with pt nephew Annie Main. CSW signing off.   PTAR # [id number] and after hours number is [place], MSW, [place]

## 2013-09-01 NOTE — Progress Notes (Signed)
09/01/2013 0920 dc instruction faxed to [facility] on 08/31/2013. Jonnie Finner RN CCM Case Mgmt phone [phone]

## 2013-09-11 ENCOUNTER — Telehealth: Payer: Self-pay | Admitting: *Deleted

## 2013-09-11 MED ORDER — ALENDRONATE SODIUM 70 MG PO TABS: 70.0000 mg | ORAL_TABLET | ORAL | Status: DC

## 2013-09-11 MED ORDER — BISOPROLOL-HYDROCHLOROTHIAZIDE 2.5-6.25 MG PO TABS: ORAL_TABLET | ORAL | Status: DC

## 2013-09-11 MED ORDER — SERTRALINE HCL 50 MG PO TABS: 50.0000 mg | ORAL_TABLET | Freq: Every day | ORAL | Status: AC

## 2013-09-11 NOTE — Telephone Encounter (Signed)
Received faxes from [place] that pt is changing pharmacy and they need new Rxs for:  Sertraline 50mg .  Take 1 tablet daily, #90 x no refills. Alendronate 70mg   Take 1 tablet every 7 days, #12 x no refills Bisoprolol -HCTZ 2.5-6.25mg   Take 1 tablet daily,  #90 x no refills.  Rxs faxed and pharmacy has been updated.

## 2013-09-19 ENCOUNTER — Other Ambulatory Visit: Payer: Self-pay | Admitting: Family

## 2013-10-16 NOTE — OR Nursing (Signed)
Addendum to scope page 

## 2013-10-20 ENCOUNTER — Other Ambulatory Visit: Payer: Self-pay | Admitting: Neurosurgery

## 2013-10-20 DIAGNOSIS — M5416 Radiculopathy, lumbar region: Secondary | ICD-10-CM

## 2013-10-20 DIAGNOSIS — M545 Low back pain, unspecified: Secondary | ICD-10-CM

## 2013-11-04 ENCOUNTER — Inpatient Hospital Stay (HOSPITAL_COMMUNITY)
Admission: AD | Admit: 2013-11-04 | Discharge: 2013-11-07 | DRG: 543 | Disposition: A | Discharge status: Hospice | Payer: Medicare Other | Source: Ambulatory Visit | Attending: Internal Medicine | Admitting: Internal Medicine

## 2013-11-04 ENCOUNTER — Encounter (HOSPITAL_COMMUNITY): Payer: Self-pay | Admitting: *Deleted

## 2013-11-04 ENCOUNTER — Inpatient Hospital Stay (HOSPITAL_COMMUNITY): Payer: Medicare Other

## 2013-11-04 DIAGNOSIS — F3289 Other specified depressive episodes: Secondary | ICD-10-CM | POA: Diagnosis present

## 2013-11-04 DIAGNOSIS — Z823 Family history of stroke: Secondary | ICD-10-CM

## 2013-11-04 DIAGNOSIS — K219 Gastro-esophageal reflux disease without esophagitis: Secondary | ICD-10-CM | POA: Diagnosis present

## 2013-11-04 DIAGNOSIS — M810 Age-related osteoporosis without current pathological fracture: Secondary | ICD-10-CM | POA: Diagnosis present

## 2013-11-04 DIAGNOSIS — C7952 Secondary malignant neoplasm of bone marrow: Principal | ICD-10-CM

## 2013-11-04 DIAGNOSIS — R32 Unspecified urinary incontinence: Secondary | ICD-10-CM | POA: Diagnosis present

## 2013-11-04 DIAGNOSIS — C801 Malignant (primary) neoplasm, unspecified: Secondary | ICD-10-CM | POA: Diagnosis present

## 2013-11-04 DIAGNOSIS — G992 Myelopathy in diseases classified elsewhere: Secondary | ICD-10-CM | POA: Diagnosis present

## 2013-11-04 DIAGNOSIS — C7951 Secondary malignant neoplasm of bone: Principal | ICD-10-CM | POA: Diagnosis present

## 2013-11-04 DIAGNOSIS — Z801 Family history of malignant neoplasm of trachea, bronchus and lung: Secondary | ICD-10-CM

## 2013-11-04 DIAGNOSIS — M48062 Spinal stenosis, lumbar region with neurogenic claudication: Secondary | ICD-10-CM | POA: Diagnosis present

## 2013-11-04 DIAGNOSIS — E785 Hyperlipidemia, unspecified: Secondary | ICD-10-CM | POA: Diagnosis present

## 2013-11-04 DIAGNOSIS — Z96659 Presence of unspecified artificial knee joint: Secondary | ICD-10-CM | POA: Diagnosis not present

## 2013-11-04 DIAGNOSIS — IMO0002 Reserved for concepts with insufficient information to code with codable children: Secondary | ICD-10-CM

## 2013-11-04 DIAGNOSIS — Z66 Do not resuscitate: Secondary | ICD-10-CM | POA: Diagnosis present

## 2013-11-04 DIAGNOSIS — I10 Essential (primary) hypertension: Secondary | ICD-10-CM | POA: Diagnosis present

## 2013-11-04 DIAGNOSIS — G8220 Paraplegia, unspecified: Secondary | ICD-10-CM | POA: Diagnosis present

## 2013-11-04 DIAGNOSIS — Z6831 Body mass index (BMI) 31.0-31.9, adult: Secondary | ICD-10-CM

## 2013-11-04 DIAGNOSIS — M1990 Unspecified osteoarthritis, unspecified site: Secondary | ICD-10-CM | POA: Diagnosis present

## 2013-11-04 DIAGNOSIS — C799 Secondary malignant neoplasm of unspecified site: Secondary | ICD-10-CM | POA: Diagnosis present

## 2013-11-04 DIAGNOSIS — Z9889 Other specified postprocedural states: Secondary | ICD-10-CM

## 2013-11-04 DIAGNOSIS — M545 Low back pain, unspecified: Secondary | ICD-10-CM | POA: Diagnosis present

## 2013-11-04 DIAGNOSIS — Z515 Encounter for palliative care: Secondary | ICD-10-CM | POA: Diagnosis not present

## 2013-11-04 DIAGNOSIS — Z79899 Other long term (current) drug therapy: Secondary | ICD-10-CM | POA: Diagnosis not present

## 2013-11-04 DIAGNOSIS — F329 Major depressive disorder, single episode, unspecified: Secondary | ICD-10-CM | POA: Diagnosis present

## 2013-11-04 LAB — CBC
HCT: 36.1 % (ref 36.0–46.0)
HEMOGLOBIN: 11.9 g/dL — AB (ref 12.0–15.0)
MCH: 31.6 pg (ref 26.0–34.0)
MCHC: 33 g/dL (ref 30.0–36.0)
MCV: 95.8 fL (ref 78.0–100.0)
Platelets: 240 10*3/uL (ref 150–400)
RBC: 3.77 MIL/uL — AB (ref 3.87–5.11)
RDW: 15.4 % (ref 11.5–15.5)
WBC: 7.1 10*3/uL (ref 4.0–10.5)

## 2013-11-04 LAB — CREATININE, SERUM
Creatinine, Ser: 0.65 mg/dL (ref 0.50–1.10)
GFR calc Af Amer: 89 mL/min — ABNORMAL LOW (ref >90)
GFR calc non Af Amer: 76 mL/min — ABNORMAL LOW (ref >90)

## 2013-11-04 MED ORDER — ONDANSETRON HCL 4 MG/2ML IJ SOLN: 4.0000 mg | Freq: Four times a day (QID) | INTRAMUSCULAR | Status: DC | PRN

## 2013-11-04 MED ORDER — ACETAMINOPHEN 325 MG PO TABS: 650.0000 mg | ORAL_TABLET | Freq: Four times a day (QID) | ORAL | Status: DC | PRN

## 2013-11-04 MED ORDER — ACETAMINOPHEN 650 MG RE SUPP: 650.0000 mg | Freq: Four times a day (QID) | RECTAL | Status: DC | PRN

## 2013-11-04 MED ORDER — LACTATED RINGERS IV SOLN
INTRAVENOUS | Status: DC
  Administered 2013-11-04 – 2013-11-06 (×4): via INTRAVENOUS

## 2013-11-04 MED ORDER — GADOBENATE DIMEGLUMINE 529 MG/ML IV SOLN
15.0000 mL | Freq: Once | INTRAVENOUS | Status: AC | PRN
  Administered 2013-11-04: 15 mL via INTRAVENOUS

## 2013-11-04 MED ORDER — ONDANSETRON HCL 4 MG PO TABS
4.0000 mg | ORAL_TABLET | Freq: Four times a day (QID) | ORAL | Status: DC | PRN
Start: 2013-11-04 — End: 2013-11-07

## 2013-11-04 MED ORDER — ENOXAPARIN SODIUM 30 MG/0.3ML ~~LOC~~ SOLN
30.0000 mg | SUBCUTANEOUS | Status: DC
  Administered 2013-11-04: 30 mg via SUBCUTANEOUS
  Filled 2013-11-04 (×2): qty 0.3

## 2013-11-04 MED ORDER — HYDROCODONE-ACETAMINOPHEN 5-325 MG PO TABS
1.0000 | ORAL_TABLET | ORAL | Status: DC | PRN
Start: 2013-11-04 — End: 2013-11-07
  Administered 2013-11-04 – 2013-11-06 (×3): 1 via ORAL
  Administered 2013-11-07: 2 via ORAL
  Filled 2013-11-04 (×2): qty 1
  Filled 2013-11-04: qty 2
  Filled 2013-11-04: qty 1

## 2013-11-04 MED ORDER — MORPHINE SULFATE 2 MG/ML IJ SOLN
2.0000 mg | INTRAMUSCULAR | Status: DC | PRN
  Administered 2013-11-05 – 2013-11-06 (×3): 2 mg via INTRAVENOUS
  Filled 2013-11-04 (×3): qty 1

## 2013-11-04 MED ORDER — DOCUSATE SODIUM 100 MG PO CAPS
100.0000 mg | ORAL_CAPSULE | Freq: Two times a day (BID) | ORAL | Status: DC
  Administered 2013-11-04 – 2013-11-06 (×3): 100 mg via ORAL
  Filled 2013-11-04 (×7): qty 1

## 2013-11-04 NOTE — H&P (Signed)
Subjective: Katherine Rubio is an 80 year old white female on whom I previously performed a T6 kyphoplasty in January of 2014. The patient developed back and leg pain and was found to have lumbar spinal stenosis. I performed a lumbar laminectomy in April 2015. The patient's postoperative course was remarkable for an epidural hematoma. She was taken back to the OR for evacuation of the hematoma. The patient eventually improved neurologically and was discharged to home. She was making progress with ambulation but then regressed. She was worked up with a lumbar MRI which looked "good". The patient developed progressive lower extremity weakness. I discussed the situation with the patient's power of attorney, [name]. The patient is now admitted for a thoracic MRI and further workup for presumed metastatic disease. She has been paraplegic for days.  Presently the patient is alert and pleasant. She denies pain. I spoke with her family. We discussed the results of her thoracic MRI.   Past Medical History  Diagnosis Date  . Depression   . Hyperlipidemia   . Hypertension   . Chronic low back pain   . PONV (postoperative nausea and vomiting)   . GERD (gastroesophageal reflux disease)   . Osteoarthritis     Past Surgical History  Procedure Laterality Date  . Back surgery    . Knee surgery      left knee replacement  . Eye surgery      BIL CATARACT  . Kyphoplasty  05/15/2012    Procedure: KYPHOPLASTY;  Surgeon: [name], MD;  Location: [facility];  Service: Neurosurgery;  Laterality: N/A;  Thoracic Six Kyphoplasty  . Lumbar laminectomy  08/17/2013    L 1 L2 L3 L4 L5         DR Arnoldo Morale  . Lumbar laminectomy/decompression microdiscectomy N/A 08/17/2013    Procedure: LUMBAR LAMINECTOMY/DECOMPRESSION MICRODISCECTOMY [place] TWO-THREE,LUMBAR THREE-FOUR,LUMBAR FOUR-FIVE;  Surgeon: [name], MD;  Location: [facility];  Service: Neurosurgery;  Laterality: N/A;  .  Lumbar wound debridement N/A 08/22/2013    Procedure: EXPLORATION OF LUMBAR WOUND WITH REMOVAL OF EPIDURAL HEMATOMA, Repair of pseudomeningocel;  Surgeon: Eustace Moore, MD;  Location: [place] [facility];  Service: Neurosurgery;  Laterality: N/A;    No Known Allergies  History  Substance Use Topics  . Smoking status: Never Smoker   . Smokeless tobacco: Never Used  . Alcohol Use: No    Family History  Problem Relation Age of Onset  . Lung cancer Brother     deceased  . Stroke Father     deceased age 47  . Pulmonary fibrosis Son     deceased  . Osteoarthritis Brother    Prior to Admission medications   Medication Sig Start Date End Date Taking? Authorizing Provider  acetaminophen (TYLENOL) 500 MG tablet Take 1,000 mg by mouth every 6 (six) hours.   Yes Historical Provider, MD  ibandronate (BONIVA) 150 MG tablet Take 150 mg by mouth every 30 (thirty) days.  10/08/13  Yes Historical Provider, MD  miconazole (MICOTIN) 2 % powder Apply 1 application topically as needed for itching (under breasts after bath until affected area is clear).   Yes Historical Provider, MD  mirtazapine (REMERON) 15 MG tablet Take 15 mg by mouth at bedtime.  11/03/13  Yes Historical Provider, MD  omeprazole (PRILOSEC) 20 MG capsule Take 20 mg by mouth daily.  11/03/13  Yes Historical Provider, MD  ondansetron (ZOFRAN-ODT) 4 MG disintegrating tablet Take 4-8 mg by mouth every 8 (eight) hours as  needed for nausea or vomiting.   Yes Historical Provider, MD  polyethylene glycol (MIRALAX / GLYCOLAX) packet Take 17 g by mouth daily.   Yes Historical Provider, MD  sertraline (ZOLOFT) 50 MG tablet Take 1 tablet (50 mg total) by mouth daily. 09/11/13  Yes Debbrah Alar, NP  traMADol (ULTRAM) 50 MG tablet Take 1 tablet (50 mg total) by mouth every 6 (six) hours as needed for moderate pain. 08/31/13  Yes [name], MD     Review of Systems  Positive ROS: As above, she denies neck pain, arm numbness tingling weakness,  etc.  All other systems have been reviewed and were otherwise negative with the exception of those mentioned in the HPI and as above.  Objective: Vital signs in last 24 hours: Temp:  [99 F (37.2 C)] 99 F (37.2 C) (07/15 1417) Resp:  [20] 20 (07/15 1417) BP: (123)/(65) 123/65 mmHg (07/15 1417) SpO2:  [100 %] 100 % (07/15 1417) Weight:  [81.647 kg (180 lb)] 81.647 kg (180 lb) (07/15 1417)  General Appearance: Alert, cooperative, no distress, Head: Normocephalic, without obvious abnormality, atraumatic Eyes: PERRL, conjunctiva/corneas clear, EOM's intact,    Thorax: Symmetric  Abdomen: Soft  Extremities: Mild bilateral pedal edema  Neurologic exam: The patient is alert and oriented x3. Glasgow Coma Scale 15. Her strength is 5 over 5 in her bilateral upper extremities. She is paraplegic.  I have reviewed the patient's thoracic MRI. She has an old T6 compression fracture/kyphoplasty. She has a T10 lesion with significant spinal cord compression. Data Review Lab Results  Component Value Date   WBC 7.1 11/04/2013   HGB 11.9* 11/04/2013   HCT 36.1 11/04/2013   MCV 95.8 11/04/2013   PLT 240 11/04/2013   Lab Results  Component Value Date   NA 138 08/24/2013   K 4.1 08/24/2013   CL 105 08/24/2013   CO2 22 08/24/2013   BUN 22 08/24/2013   CREATININE 0.65 11/04/2013   GLUCOSE 102* 08/24/2013   Lab Results  Component Value Date   INR 1.00 01/02/2011    Assessment/Plan: T10 tumor, paraplegia: I have discussed the situation with the patient and her power of attorney/nephew, [name], and other present family members. I have told the patient that this is likely a metastatic lesion. We discussed further workup with a CT of the chest, abdomen and pelvis as well as a oncology consult. Depending on the outcome of the studies we may need to proceed with a biopsy. She, and her family, understand that at her age the prognosis is poor. Depending on the CT and the oncologist opinion she may elect for palliative  care/hospice. I have answered all their questions.   [name] 11/04/2013 6:34 PM

## 2013-11-05 ENCOUNTER — Ambulatory Visit: Payer: Medicare Other | Admitting: Radiation Oncology

## 2013-11-05 ENCOUNTER — Inpatient Hospital Stay (HOSPITAL_COMMUNITY): Payer: Medicare Other

## 2013-11-05 ENCOUNTER — Encounter (HOSPITAL_COMMUNITY): Payer: Self-pay | Admitting: Radiology

## 2013-11-05 DIAGNOSIS — D649 Anemia, unspecified: Secondary | ICD-10-CM

## 2013-11-05 DIAGNOSIS — C412 Malignant neoplasm of vertebral column: Secondary | ICD-10-CM

## 2013-11-05 MED ORDER — ENOXAPARIN SODIUM 40 MG/0.4ML ~~LOC~~ SOLN
40.0000 mg | SUBCUTANEOUS | Status: DC
  Administered 2013-11-05 – 2013-11-06 (×2): 40 mg via SUBCUTANEOUS
  Filled 2013-11-05 (×3): qty 0.4

## 2013-11-05 MED ORDER — IOHEXOL 300 MG/ML  SOLN
25.0000 mL | INTRAMUSCULAR | Status: AC
  Administered 2013-11-05: 25 mL via ORAL

## 2013-11-05 MED ORDER — IOHEXOL 300 MG/ML  SOLN
100.0000 mL | Freq: Once | INTRAMUSCULAR | Status: AC | PRN
  Administered 2013-11-05: 100 mL via INTRAVENOUS

## 2013-11-05 NOTE — Care Management Note (Unsigned)
    Page 1 of 1   11/05/2013     1:32:19 PM CARE MANAGEMENT NOTE 11/05/2013  Patient:  Katherine Rubio, Katherine Rubio   Account Number:  [id number]  Date Initiated:  11/05/2013  Documentation initiated by:  [facility]  Subjective/Objective Assessment:   5 year year old female admitted with paraplegia.     Action/Plan:   From home.   Anticipated DC Date:  11/08/2013   Anticipated DC Plan:        DC Planning Services  CM consult      Choice offered to / List presented to:             Status of service:  In process, will continue to follow Medicare Important Message given?   (If response is "NO", the following Medicare IM given date fields will be blank) Date Medicare IM given:   Medicare IM given by:   Date Additional Medicare IM given:   Additional Medicare IM given by:    Discharge Disposition:    Per UR Regulation:  Reviewed for med. necessity/level of care/duration of stay  If discussed at [place] of Stay Meetings, dates discussed:    Comments:

## 2013-11-05 NOTE — Progress Notes (Signed)
Nutrition Brief Note  Patient identified on the Malnutrition Screening Tool (MST) Report  Wt Readings from Last 5 Encounters:  11/04/13 180 lb (81.647 kg)  08/18/13 181 lb 10.5 oz (82.4 kg)  08/18/13 181 lb 10.5 oz (82.4 kg)  08/18/13 181 lb 10.5 oz (82.4 kg)  07/28/13 184 lb (83.462 kg)    Body mass index is 31.89 kg/(m^2). Patient meets criteria for class I obesity based on current BMI.   Current diet order is regular, pt reports she has been eating well. Labs and medications reviewed. Admitted with T10 tumor and paraplegia. Met with pt who reports she was eating "all the time" at home with excellent appetite, no set meals, with stable weight. Not on any nutritional supplements at home.   No nutrition interventions warranted at this time. If nutrition issues arise, please consult RD.   Carlis Stable MS, [place], LDN [phone] Pager [phone] Weekend/After Hours Pager

## 2013-11-05 NOTE — Progress Notes (Addendum)
Radiation Oncology         [phone]) [phone] ________________________________  Initial inpatient Consultation - Date: 11/04/2013   Name: Katherine Rubio MRN: [id number]   DOB: 01/07/1923  REFERRING PHYSICIAN: Earle Gell, MD  DIAGNOSIS: Cord Compression (likely from malignancy)  HISTORY OF PRESENT ILLNESS::Katherine Rubio is a 80 y.o. female  who underwent a recent laminectomy in April of 2015. This was for back pain. This was complicated by an epidural hematoma. She was recovering well from this surgery until about a month ago when she began to experience progressive lower extremity weakness. This began in her feet and extended to her lower pelvis about one week ago. She has since that time also developed numbness up to the level of her umbilical cord. She has had urinary incontinence as well. She had her first episode of bowel incontinence today. She has also been experiencing uncontrollable spasms of her bilateral lower extremities starting today. She denies any weight loss nausea vomiting or abdominal pain. She has minimal back pain which is really controlled with minimal pain medications. She rates this about a 3. She was admitted and a MRI of the spine was performed on 7/15 which was remarkable for a 3.6 x 3.7 x 3.3 cm enhancing mass in the posterior aspect of the T10 vertebral body. Cord compression was noted. Additional enhancing lesions were noted at T5, T11 and T12. A CT of the chest abdomen and pelvis was performed earlier this morning which did not show any evidence of a primary lesion. Labs show basically a normal CBC with a mild anemia of 11.9 and normal platelets and white count. She is accompanied by her nephew and granddaughters. I was asked to consult regarding the role of radiation in the management of this cord compression.  PREVIOUS RADIATION THERAPY: No  PAST MEDICAL HISTORY:  has a past medical history of Depression; Hyperlipidemia; Hypertension; Chronic low back  pain; PONV (postoperative nausea and vomiting); GERD (gastroesophageal reflux disease); and Osteoarthritis.    PAST SURGICAL HISTORY: Past Surgical History  Procedure Laterality Date  . Back surgery    . Knee surgery      left knee replacement  . Eye surgery      BIL CATARACT  . Kyphoplasty  05/15/2012    Procedure: KYPHOPLASTY;  Surgeon: [name], MD;  Location: [place] [facility];  Service: Neurosurgery;  Laterality: N/A;  Thoracic Six Kyphoplasty  . Lumbar laminectomy  08/17/2013    L 1 L2 L3 L4 L5         DR Arnoldo Morale  . Lumbar laminectomy/decompression microdiscectomy N/A 08/17/2013    Procedure: LUMBAR LAMINECTOMY/DECOMPRESSION MICRODISCECTOMY [place] TWO-THREE,LUMBAR THREE-FOUR,LUMBAR FOUR-FIVE;  Surgeon: [name], MD;  Location: [place] [facility];  Service: Neurosurgery;  Laterality: N/A;  . Lumbar wound debridement N/A 08/22/2013    Procedure: EXPLORATION OF LUMBAR WOUND WITH REMOVAL OF EPIDURAL HEMATOMA, Repair of pseudomeningocel;  Surgeon: Eustace Moore, MD;  Location: [place] [facility];  Service: Neurosurgery;  Laterality: N/A;    FAMILY HISTORY:  Family History  Problem Relation Age of Onset  . Lung cancer Brother     deceased  . Stroke Father     deceased age 21  . Pulmonary fibrosis Son     deceased  . Osteoarthritis Brother     SOCIAL HISTORY:  History  Substance Use Topics  . Smoking status: Never Smoker   . Smokeless tobacco: Never Used  . Alcohol Use: No    ALLERGIES: Review of  patient's allergies indicates no known allergies.  MEDICATIONS:  Current Facility-Administered Medications  Medication Dose Route Frequency Provider Last Rate Last Dose  . acetaminophen (TYLENOL) tablet 650 mg  650 mg Oral Q6H PRN [name], MD       Or  . acetaminophen (TYLENOL) suppository 650 mg  650 mg Rectal Q6H PRN [name], MD      . docusate sodium (COLACE) capsule 100 mg  100 mg Oral BID [name], MD   100 mg at 11/05/13  1120  . enoxaparin (LOVENOX) injection 40 mg  40 mg Subcutaneous Q24H Randall K Absher, RPH   40 mg at 11/05/13 1704  . HYDROcodone-acetaminophen (NORCO/VICODIN) 5-325 MG per tablet 1-2 tablet  1-2 tablet Oral Q4H PRN [name], MD   1 tablet at 11/04/13 2108  . lactated ringers infusion   Intravenous Continuous [name], MD 50 mL/hr at 11/05/13 1122    . morphine 2 MG/ML injection 2 mg  2 mg Intravenous Q2H PRN [name], MD   2 mg at 11/05/13 1016  . ondansetron (ZOFRAN) tablet 4 mg  4 mg Oral Q6H PRN [name], MD       Or  . ondansetron [facility]) injection 4 mg  4 mg Intravenous Q6H PRN [name], MD        REVIEW OF SYSTEMS:  A 15 point review of systems is documented in the electronic medical record. This was obtained by the nursing staff. However, I reviewed this with the patient to discuss relevant findings and make appropriate changes.  Pertinent items are noted in HPI.  PHYSICAL EXAM:  Filed Vitals:   11/05/13 1423  BP: 112/81  Pulse: 105  Temp: 98.3 F (36.8 C)  Resp: 19  .180 lb (81.647 kg). Pleasant elderly female who is alert and oriented sitting comfortably in the bed. He has 5 out of 5 strength in her bilateral upper extremity she is completely plegic in both lower extremities with no sensation to the lower pelvis.   LABORATORY DATA:  Lab Results  Component Value Date   WBC 7.1 11/04/2013   HGB 11.9* 11/04/2013   HCT 36.1 11/04/2013   MCV 95.8 11/04/2013   PLT 240 11/04/2013   Lab Results  Component Value Date   NA 138 08/24/2013   K 4.1 08/24/2013   CL 105 08/24/2013   CO2 22 08/24/2013   Lab Results  Component Value Date   ALT 14 01/12/2013   AST 21 01/12/2013   ALKPHOS 61 01/12/2013   BILITOT 0.5 01/12/2013     RADIOGRAPHY: Ct Chest W Contrast  11/05/2013   CLINICAL DATA:  Thoracic metastatic disease, thoracic spine mass  EXAM: CT CHEST, ABDOMEN, AND PELVIS WITH CONTRAST  TECHNIQUE: Multidetector CT imaging of the chest, abdomen  and pelvis was performed following the standard protocol during bolus administration of intravenous contrast.  CONTRAST:  130m OMNIPAQUE IOHEXOL 300 MG/ML  SOLN  COMPARISON:  None.  FINDINGS: CT CHEST FINDINGS  The central airways are patent. The lungs are clear. There is no pleural effusion or pneumothorax.  There are no pathologically enlarged axillary, hilar or mediastinal lymph nodes.  The heart size is normal. There is no pericardial effusion. The thoracic aorta is normal in caliber. There is coronary artery atherosclerosis involving the LAD and circumflex. There is mitral annular calcification. There is aortic valvular calcification.  Review of bone windows demonstrates a homogeneously enhancing mass in the right posterior aspect of  the T10 vertebral body with tumor extension into the foramen and spinal canal. There is a possible small lesion along the inferior posterior endplate of the T9 vertebral body. The smaller lesions demonstrated on recent thoracolumbar spine MRI are not well delineated. There are compression fractures of the T6 and T9 vertebral bodies.  CT ABDOMEN AND PELVIS FINDINGS  The liver demonstrates no focal abnormality. There is no intrahepatic or extrahepatic biliary ductal dilatation. There is cholelithiasis. The common bile duct is dilated measuring 14.4 mm in diameter. The spleen demonstrates no focal abnormality.There is a 14 x 10 mm hypodense interpolar left renal mass most consistent with a cyst. The right kidney, adrenal glands and pancreas are normal. The bladder is unremarkable.  The stomach, duodenum, small intestine, and large intestine demonstrate no contrast extravasation or dilatation. There is no pneumoperitoneum, pneumatosis, or portal venous gas. There is no abdominal or pelvic free fluid. There is no lymphadenopathy.  The abdominal aorta is normal in caliber with atherosclerosis.  There is a L3 compression fracture. There is posterior lumbar decompression from L2 through  L4 with postsurgical changes in the posterior paraspinal musculature. The small lesions demonstrated on recent thoracolumbar spine MRI are not well delineated.  IMPRESSION: 1. No evidence of intrathoracic metastatic disease. No evidence of abdominal or pelvic metastatic disease. 2. Osseous malignancy most severely involving the T10 vertebral body which may reflect metastatic disease versus multiple myeloma. The smaller lesions demonstrated on recent thoracolumbar spine MRI are not well delineated. 3. Cholelithiasis with mild common bile duct dilatation. No definite choledocholithiasis.   Electronically Signed   By: Kathreen Devoid   On: 11/05/2013 11:55   Mr Thoracic Spine W Wo Contrast  11/04/2013   CLINICAL DATA:  Spine lesions.  Paraplegia.  EXAM: MRI THORACIC SPINE WITHOUT AND WITH CONTRAST  TECHNIQUE: Multiplanar and multiecho pulse sequences of the thoracic spine were obtained without and with intravenous contrast.  CONTRAST:  67m MULTIHANCE GADOBENATE DIMEGLUMINE 529 MG/ML IV SOLN  COMPARISON:  MRI of the thoracic spine 05/13/2012.  FINDINGS: A homogeneously enhancing mass lesion within the posterior right T10 vertebral body extends into the spinal canal and narrows the cord to 4.5 mm in AP diameter. The lesion measures 3.6 x 3.7 x 3.3 cm. Abnormal cord signal is evident 1.7 cm above can below the compression.  Enhancing tumor extends superiorly into the T9 vertebral body on the right there is also a focus of enhancement within the lateral left lamina and medial left transverse process of T9.  A focal area of enhancement along the inferior aspect of the T5 measures 6.5 mm. There is a 7 mm focus of enhancement along the posterior superior endplate of TE39 A 7 mm lesion is evident within the anterior central aspect of the T12 vertebral body. These lesions are all new since the prior study apart from a focal area of signal abnormality within the right aspect of the T10 vertebral body.  A remote fracture is  noted at T9. Patient is status post spinal augmentation at T6. A hemangioma along the superior endplate of T1 is stable.  No significant focal paraspinous lesions are present.  T1-2:  Mild disc bulging is present without significant stenosis  T2-3: A mild disc protrusion is present without significant stenosis.  T3-4: A left paramedian disc protrusion is stable. There is no significant stenosis.  T4-5: Negative.  T5-6:  Negative.  T6-7: Retropulsed bone partially effaces the ventral CSF. Mild right foraminal stenosis is evident.  T7-8:  Negative.  T8-9:  Negative.  T9-10: As stated above, there is severe right foraminal stenosis secondary to tumor invasion. Mild osseous narrowing is present on the left.  T10-11: Moderate right and mild left foraminal stenosis is secondary to extraosseous extension of tumor.  T11-12: Asymmetric left-sided facet hypertrophy were results in mild left foraminal stenosis. The central canal and right foramen are patent.  T12-L1: A central disc protrusion is present without significant stenosis.  L1-2: Moderate facet spurring is again noted. Left greater right foraminal narrowing is evident.  IMPRESSION: 1. 3.7 cm homogeneously enhancing mass lesion is centered within the right posterior aspect of the T10 vertebral body. 2. Extensive extraosseous tumor extension with severe foraminal narrowing on the right at T9-10 and mild foraminal narrowing bilaterally at T10-11. 3. Severe central canal stenosis at the T10 level with abnormal cord signal extending 1.7 cm above and below the area of stenosis. The cord is narrowed to 4.5 mm. 4. Additional enhancing lesions are present at T5, T11, and T12. The multiple lesions suggest metastatic disease. Multiple myeloma is also considered. Critical Value/emergent results were called by telephone at the time of interpretation on 11/04/2013 at 6:39 pm to Dr. Kary Kos, who verbally acknowledged these results.   Electronically Signed   By: Lawrence Santiago M.D.    On: 11/04/2013 18:39   Ct Abdomen Pelvis W Contrast  11/05/2013   CLINICAL DATA:  Thoracic metastatic disease, thoracic spine mass  EXAM: CT CHEST, ABDOMEN, AND PELVIS WITH CONTRAST  TECHNIQUE: Multidetector CT imaging of the chest, abdomen and pelvis was performed following the standard protocol during bolus administration of intravenous contrast.  CONTRAST:  110m OMNIPAQUE IOHEXOL 300 MG/ML  SOLN  COMPARISON:  None.  FINDINGS: CT CHEST FINDINGS  The central airways are patent. The lungs are clear. There is no pleural effusion or pneumothorax.  There are no pathologically enlarged axillary, hilar or mediastinal lymph nodes.  The heart size is normal. There is no pericardial effusion. The thoracic aorta is normal in caliber. There is coronary artery atherosclerosis involving the LAD and circumflex. There is mitral annular calcification. There is aortic valvular calcification.  Review of bone windows demonstrates a homogeneously enhancing mass in the right posterior aspect of the T10 vertebral body with tumor extension into the foramen and spinal canal. There is a possible small lesion along the inferior posterior endplate of the T9 vertebral body. The smaller lesions demonstrated on recent thoracolumbar spine MRI are not well delineated. There are compression fractures of the T6 and T9 vertebral bodies.  CT ABDOMEN AND PELVIS FINDINGS  The liver demonstrates no focal abnormality. There is no intrahepatic or extrahepatic biliary ductal dilatation. There is cholelithiasis. The common bile duct is dilated measuring 14.4 mm in diameter. The spleen demonstrates no focal abnormality.There is a 14 x 10 mm hypodense interpolar left renal mass most consistent with a cyst. The right kidney, adrenal glands and pancreas are normal. The bladder is unremarkable.  The stomach, duodenum, small intestine, and large intestine demonstrate no contrast extravasation or dilatation. There is no pneumoperitoneum, pneumatosis, or  portal venous gas. There is no abdominal or pelvic free fluid. There is no lymphadenopathy.  The abdominal aorta is normal in caliber with atherosclerosis.  There is a L3 compression fracture. There is posterior lumbar decompression from L2 through L4 with postsurgical changes in the posterior paraspinal musculature. The small lesions demonstrated on recent thoracolumbar spine MRI are not well delineated.  IMPRESSION: 1. No evidence of intrathoracic metastatic disease. No evidence of  abdominal or pelvic metastatic disease. 2. Osseous malignancy most severely involving the T10 vertebral body which may reflect metastatic disease versus multiple myeloma. The smaller lesions demonstrated on recent thoracolumbar spine MRI are not well delineated. 3. Cholelithiasis with mild common bile duct dilatation. No definite choledocholithiasis.   Electronically Signed   By: Kathreen Devoid   On: 11/05/2013 11:55     IMPRESSION: 80 yo with cord compression likely from malignancy but no primary evident  PLAN: I spoke to the patient and family today. We discussed radiation in the management of cord compression. We discussed the role of radiation in pain control and often times in taking pressure off the spinal cord to allow movement. At this point she is plegic for over a week so I think the likelihood of her recovering function in her lower extremeties is low even in the setting of a very radiosensitive malignancy such as multiple myeloma.It is also possible this could be an infection or a nonmalignant process for which radiation would be ineffective. We discussed that radiation would not improve her survival nor allow her to walk again. Radiation would only decrease her pain and prevent this one area from growing. I would not recommend treating all areas as this is a large amount of bone marrow and also a large amount of esophagus in the radiation fields which could cause significant heartburn, odynophagia, and nausea. This could  all leads to significant dehydration and malnutrition in her we would precipitate other events such as pneumonia and urinary tract infections. I  encouraged her to go home with support and possibly hospice. She certainly has done well up until this point with 24-hour care and has the financial resources to continue to provide that. She would like to spend more time of her great-granddaughters as well as her granddaughters and nephew who are in the room with her today. If she would like to proceed on with radiation I would recommend we followup on studies that Dr. Lorelle Formosa has ordered and if those are inconclusive proceed on with biopsy of this lesion perhaps by interventional radiology.   I would consider starting decadron if her pain increases.  Right now she seems comfortable so I'm not sure it would benefit her and may lead to thrush or vaginitis.   I am at our [facility] clinic tomorrow but will be back around 2:30-3PM if they have further questions.   I spent 60 minutes  face to face with the patient and more than 50% of that time was spent in counseling and/or coordination of care.   ------------------------------------------------  Thea Silversmith, MD

## 2013-11-05 NOTE — Progress Notes (Signed)
Patient ID: Katherine Rubio, female   DOB: [birth year], 80 y.o.   MRN: [id number] Subjective:  Alert , pleasant, no pain  Objective: Vital signs in last 24 hours: Temp:  [98.2 F (36.8 C)-98.3 F (36.8 C)] 98.3 F (36.8 C) (07/16 1423) Pulse Rate:  [92-105] 105 (07/16 1423) Resp:  [19-20] 19 (07/16 1423) BP: (111-148)/(69-81) 112/81 mmHg (07/16 1423) SpO2:  [94 %-97 %] 94 % (07/16 1423)  Intake/Output from previous day: 07/15 0701 - 07/16 0700 In: 930 [P.O.:240; I.V.:690] Out: -  Intake/Output this shift:    Physical exam Alert, oriented, paraplegic  Lab Results:  Recent Labs  11/04/13 1458  WBC 7.1  HGB 11.9*  HCT 36.1  PLT 240   BMET  Recent Labs  11/04/13 1458  CREATININE 0.65    Studies/Results: Ct Chest W Contrast  11/05/2013   CLINICAL DATA:  Thoracic metastatic disease, thoracic spine mass  EXAM: CT CHEST, ABDOMEN, AND PELVIS WITH CONTRAST  TECHNIQUE: Multidetector CT imaging of the chest, abdomen and pelvis was performed following the standard protocol during bolus administration of intravenous contrast.  CONTRAST:  194m OMNIPAQUE IOHEXOL 300 MG/ML  SOLN  COMPARISON:  None.  FINDINGS: CT CHEST FINDINGS  The central airways are patent. The lungs are clear. There is no pleural effusion or pneumothorax.  There are no pathologically enlarged axillary, hilar or mediastinal lymph nodes.  The heart size is normal. There is no pericardial effusion. The thoracic aorta is normal in caliber. There is coronary artery atherosclerosis involving the LAD and circumflex. There is mitral annular calcification. There is aortic valvular calcification.  Review of bone windows demonstrates a homogeneously enhancing mass in the right posterior aspect of the T10 vertebral body with tumor extension into the foramen and spinal canal. There is a possible small lesion along the inferior posterior endplate of the T9 vertebral body. The smaller lesions demonstrated on recent thoracolumbar  spine MRI are not well delineated. There are compression fractures of the T6 and T9 vertebral bodies.  CT ABDOMEN AND PELVIS FINDINGS  The liver demonstrates no focal abnormality. There is no intrahepatic or extrahepatic biliary ductal dilatation. There is cholelithiasis. The common bile duct is dilated measuring 14.4 mm in diameter. The spleen demonstrates no focal abnormality.There is a 14 x 10 mm hypodense interpolar left renal mass most consistent with a cyst. The right kidney, adrenal glands and pancreas are normal. The bladder is unremarkable.  The stomach, duodenum, small intestine, and large intestine demonstrate no contrast extravasation or dilatation. There is no pneumoperitoneum, pneumatosis, or portal venous gas. There is no abdominal or pelvic free fluid. There is no lymphadenopathy.  The abdominal aorta is normal in caliber with atherosclerosis.  There is a L3 compression fracture. There is posterior lumbar decompression from L2 through L4 with postsurgical changes in the posterior paraspinal musculature. The small lesions demonstrated on recent thoracolumbar spine MRI are not well delineated.  IMPRESSION: 1. No evidence of intrathoracic metastatic disease. No evidence of abdominal or pelvic metastatic disease. 2. Osseous malignancy most severely involving the T10 vertebral body which may reflect metastatic disease versus multiple myeloma. The smaller lesions demonstrated on recent thoracolumbar spine MRI are not well delineated. 3. Cholelithiasis with mild common bile duct dilatation. No definite choledocholithiasis.   Electronically Signed   By: HKathreen Devoid  On: 11/05/2013 11:55   Mr Thoracic Spine W Wo Contrast  11/04/2013   CLINICAL DATA:  Spine lesions.  Paraplegia.  EXAM: MRI THORACIC SPINE WITHOUT AND WITH CONTRAST  TECHNIQUE: Multiplanar and multiecho pulse sequences of the thoracic spine were obtained without and with intravenous contrast.  CONTRAST:  20m MULTIHANCE GADOBENATE  DIMEGLUMINE 529 MG/ML IV SOLN  COMPARISON:  MRI of the thoracic spine 05/13/2012.  FINDINGS: A homogeneously enhancing mass lesion within the posterior right T10 vertebral body extends into the spinal canal and narrows the cord to 4.5 mm in AP diameter. The lesion measures 3.6 x 3.7 x 3.3 cm. Abnormal cord signal is evident 1.7 cm above can below the compression.  Enhancing tumor extends superiorly into the T9 vertebral body on the right there is also a focus of enhancement within the lateral left lamina and medial left transverse process of T9.  A focal area of enhancement along the inferior aspect of the T5 measures 6.5 mm. There is a 7 mm focus of enhancement along the posterior superior endplate of TW29 A 7 mm lesion is evident within the anterior central aspect of the T12 vertebral body. These lesions are all new since the prior study apart from a focal area of signal abnormality within the right aspect of the T10 vertebral body.  A remote fracture is noted at T9. Patient is status post spinal augmentation at T6. A hemangioma along the superior endplate of T1 is stable.  No significant focal paraspinous lesions are present.  T1-2:  Mild disc bulging is present without significant stenosis  T2-3: A mild disc protrusion is present without significant stenosis.  T3-4: A left paramedian disc protrusion is stable. There is no significant stenosis.  T4-5: Negative.  T5-6:  Negative.  T6-7: Retropulsed bone partially effaces the ventral CSF. Mild right foraminal stenosis is evident.  T7-8:  Negative.  T8-9:  Negative.  T9-10: As stated above, there is severe right foraminal stenosis secondary to tumor invasion. Mild osseous narrowing is present on the left.  T10-11: Moderate right and mild left foraminal stenosis is secondary to extraosseous extension of tumor.  T11-12: Asymmetric left-sided facet hypertrophy were results in mild left foraminal stenosis. The central canal and right foramen are patent.  T12-L1: A  central disc protrusion is present without significant stenosis.  L1-2: Moderate facet spurring is again noted. Left greater right foraminal narrowing is evident.  IMPRESSION: 1. 3.7 cm homogeneously enhancing mass lesion is centered within the right posterior aspect of the T10 vertebral body. 2. Extensive extraosseous tumor extension with severe foraminal narrowing on the right at T9-10 and mild foraminal narrowing bilaterally at T10-11. 3. Severe central canal stenosis at the T10 level with abnormal cord signal extending 1.7 cm above and below the area of stenosis. The cord is narrowed to 4.5 mm. 4. Additional enhancing lesions are present at T5, T11, and T12. The multiple lesions suggest metastatic disease. Multiple myeloma is also considered. Critical Value/emergent results were called by telephone at the time of interpretation on 11/04/2013 at 6:39 pm to Dr. GKary Kos who verbally acknowledged these results.   Electronically Signed   By: CLawrence SantiagoM.D.   On: 11/04/2013 18:39   Ct Abdomen Pelvis W Contrast  11/05/2013   CLINICAL DATA:  Thoracic metastatic disease, thoracic spine mass  EXAM: CT CHEST, ABDOMEN, AND PELVIS WITH CONTRAST  TECHNIQUE: Multidetector CT imaging of the chest, abdomen and pelvis was performed following the standard protocol during bolus administration of intravenous contrast.  CONTRAST:  1053mOMNIPAQUE IOHEXOL 300 MG/ML  SOLN  COMPARISON:  None.  FINDINGS: CT CHEST FINDINGS  The central airways are patent. The lungs are clear. There is no  pleural effusion or pneumothorax.  There are no pathologically enlarged axillary, hilar or mediastinal lymph nodes.  The heart size is normal. There is no pericardial effusion. The thoracic aorta is normal in caliber. There is coronary artery atherosclerosis involving the LAD and circumflex. There is mitral annular calcification. There is aortic valvular calcification.  Review of bone windows demonstrates a homogeneously enhancing mass in the  right posterior aspect of the T10 vertebral body with tumor extension into the foramen and spinal canal. There is a possible small lesion along the inferior posterior endplate of the T9 vertebral body. The smaller lesions demonstrated on recent thoracolumbar spine MRI are not well delineated. There are compression fractures of the T6 and T9 vertebral bodies.  CT ABDOMEN AND PELVIS FINDINGS  The liver demonstrates no focal abnormality. There is no intrahepatic or extrahepatic biliary ductal dilatation. There is cholelithiasis. The common bile duct is dilated measuring 14.4 mm in diameter. The spleen demonstrates no focal abnormality.There is a 14 x 10 mm hypodense interpolar left renal mass most consistent with a cyst. The right kidney, adrenal glands and pancreas are normal. The bladder is unremarkable.  The stomach, duodenum, small intestine, and large intestine demonstrate no contrast extravasation or dilatation. There is no pneumoperitoneum, pneumatosis, or portal venous gas. There is no abdominal or pelvic free fluid. There is no lymphadenopathy.  The abdominal aorta is normal in caliber with atherosclerosis.  There is a L3 compression fracture. There is posterior lumbar decompression from L2 through L4 with postsurgical changes in the posterior paraspinal musculature. The small lesions demonstrated on recent thoracolumbar spine MRI are not well delineated.  IMPRESSION: 1. No evidence of intrathoracic metastatic disease. No evidence of abdominal or pelvic metastatic disease. 2. Osseous malignancy most severely involving the T10 vertebral body which may reflect metastatic disease versus multiple myeloma. The smaller lesions demonstrated on recent thoracolumbar spine MRI are not well delineated. 3. Cholelithiasis with mild common bile duct dilatation. No definite choledocholithiasis.   Electronically Signed   By: Kathreen Devoid   On: 11/05/2013 11:55    Assessment/Plan: T10 Met: ? Empiric RT, Await labs,  possible bx.    LOS: 1 day     Rayvn Rickerson D 11/05/2013, 7:10 PM

## 2013-11-05 NOTE — Consult Note (Addendum)
Katherine Rubio  Telephone:([phone]) [place] NOTE  Katherine Rubio                                MR#: [id number]  DOB: 1922/11/09                                          CSN#: [id number]  Referring MD:  Newman Pies, Neurosurgery  Primary MD: Dr. Elizebeth Koller  Reason for Consult: Metastatic Bone Lesions                                     Cord Compression   [name] is a delightful  80 y.o. female requested to see in consultation for evaluation of metastatic bone lesions.   She has a long history of spinal stenosis, having undergone T6 kyphoplasty in January 2014 and a lumbar laminectomy in April of 1027 complicated with epidural hematoma which required evacuation. A followup MRI at the time was essentially unremarkable.   About one month ago the patient began to experience progressive lower extremity weakness, initially in both feet, then extending gradually towards the lower pelvis about 1 week ago. She also had 1 week history of urinary incontinence, possibly bowel incontinence as well per caregiver's report. She denies any respiratory or cardiac complaints. She denies any pain. She denies neck pain, upper extremity numbness, tingling or weaknesses. No known injury. Denies fever, chills or night sweats. Denies weight loss, nausea, vomiting or abdominal pain. She dies have a history of osteoporosis for which he is being treated with Fosamax as per primary care physician's instructions.no headaches or vision changes. The patient is hard of hearing, but no confusion is noted.   MRI Thoracic Spine W/Wo Contrast on 11/04/2013, was remarkable for 3.6 x 3.7 x 3.3 cm homogeneously enhancing mass lesion is centered within the right posterior aspect of the T10 vertebral body, extensive extraosseous tumor extension with severe foraminal narrowing on the right at T9-10 and mild foraminal narrowing bilaterally at T10-11. 3. Severe central  canal stenosis at the T10 level with abnormal cord signal extending 1.7 cm above and below the area of stenosis was seen and the cord is narrowed to 4.5 mm.  Additional enhancing lesions are present at T5, T11, and T12. The multiple lesions suggest metastatic disease. Labs show mild anemia but normal platelets and WBC. Creatinine normal. Workup is in progress, she is to have a CT of the chest, abdomen and pelvis today for further evaluation.     PMH:  Past Medical History  Diagnosis Date  . Depression   . Hyperlipidemia   . Hypertension   . Chronic low back pain   . PONV (postoperative nausea and vomiting)   . GERD (gastroesophageal reflux disease)   . Osteoarthritis     Surgeries:  Past Surgical History  Procedure Laterality Date  . Back surgery    . Knee surgery      left knee replacement  . Eye surgery      BIL CATARACT  . Kyphoplasty  05/15/2012    Procedure: KYPHOPLASTY;  Surgeon: [name], MD;  Location: [facility];  Service: Neurosurgery;  Laterality: N/A;  Thoracic Six Kyphoplasty  .  Lumbar laminectomy  08/17/2013    L 1 L2 L3 L4 L5         DR Arnoldo Morale  . Lumbar laminectomy/decompression microdiscectomy N/A 08/17/2013    Procedure: LUMBAR LAMINECTOMY/DECOMPRESSION MICRODISCECTOMY [place] TWO-THREE,LUMBAR THREE-FOUR,LUMBAR FOUR-FIVE;  Surgeon: [name], MD;  Location: [facility];  Service: Neurosurgery;  Laterality: N/A;  . Lumbar wound debridement N/A 08/22/2013    Procedure: EXPLORATION OF LUMBAR WOUND WITH REMOVAL OF EPIDURAL HEMATOMA, Repair of pseudomeningocel;  Surgeon: Eustace Moore, MD;  Location: [facility];  Service: Neurosurgery;  Laterality: N/A;    Allergies: No Known Allergies  Medications:   Prior to Admission:  Prescriptions prior to admission  Medication Sig Dispense Refill  . acetaminophen (TYLENOL) 500 MG tablet Take 1,000 mg by mouth every 6 (six) hours.      . ibandronate (BONIVA) 150 MG tablet Take 150 mg  by mouth every 30 (thirty) days.       . miconazole (MICOTIN) 2 % powder Apply 1 application topically as needed for itching (under breasts after bath until affected area is clear).      . mirtazapine (REMERON) 15 MG tablet Take 15 mg by mouth at bedtime.       Marland Kitchen omeprazole (PRILOSEC) 20 MG capsule Take 20 mg by mouth daily.       . ondansetron (ZOFRAN-ODT) 4 MG disintegrating tablet Take 4-8 mg by mouth every 8 (eight) hours as needed for nausea or vomiting.      . polyethylene glycol (MIRALAX / GLYCOLAX) packet Take 17 g by mouth daily.      . sertraline (ZOLOFT) 50 MG tablet Take 1 tablet (50 mg total) by mouth daily.  90 tablet  0  . traMADol (ULTRAM) 50 MG tablet Take 1 tablet (50 mg total) by mouth every 6 (six) hours as needed for moderate pain.  100 tablet  1    DGU:[id number], acetaminophen, HYDROcodone-acetaminophen, morphine injection, ondansetron (ZOFRAN) IV, ondansetron  ROS: Constitutional: Denies fevers, chills or abnormal night sweats Eyes: Denies blurriness of vision, double vision or watery eyes Ears, nose, mouth, throat, and face: Denies mucositis or sore throat Respiratory: Denies cough, dyspnea or wheezes Cardiovascular: Denies palpitation, chest discomfort or lower extremity swelling Gastrointestinal:  Denies nausea, heartburn; change in bowel habits as above Skin: Denies abnormal skin rashes Lymphatics: Denies Katherine lymphadenopathy or easy bruising Neurological: as per HPI Behavioral/Psych: Mood is stable, no Katherine changes  All other systems were reviewed with the patient and are negative.   Family History:    Family History  Problem Relation Age of Onset  . Lung cancer Brother     deceased  . Stroke Father     deceased age 48  . Pulmonary fibrosis Son     deceased  . Osteoarthritis Brother     No family history of hematological  disorders.  Social History:  reports that she has never smoked. She has never used smokeless tobacco. She reports that she  does not drink alcohol or use illicit drugs. Widowed. One deceased son. Originally from [place], [place], now lives in [place] with 24 hr sitter. Retired Agricultural engineer. Was active and independent until one month ago.    Physical Exam    ECOG PERFORMANCE STATUS: 4   Filed Vitals:   11/05/13 0608  BP: 111/69  Pulse: 92  Temp: 98.2 F (36.8 C)  Resp: 20   Filed Weights   11/04/13 1417 11/04/13 1900  Weight: 180 lb (81.647 kg) 180 lb (  81.647 kg)    GENERAL:alert, no distress and comfortable SKIN: skin color, texture, turgor are normal, no rashes or significant lesions. Some areas of keratosis noted on shoulder. EYES: normal, conjunctiva are pink and non-injected, sclera clear OROPHARYNX:no exudate, no erythema and lips, buccal mucosa, and tongue normal  NECK: supple, thyroid normal size, non-tender, without nodularity LYMPH:  no palpable lymphadenopathy in the cervical, axillary or inguinal area LUNGS: clear to auscultation and percussion with normal breathing effort HEART: regular rate & rhythm and no murmurs and trace of  lower extremity edema ABDOMEN:abdomen soft, moderately obese, non-tender and normal bowel sounds Musculoskeletal:no cyanosis of digits and no clubbing  PSYCH: alert & oriented x 3 with fluent speech NEURO: paraplegic in both lower extremities with decreased sensation up to the lower pelvis. Left ear deafness.     Labs:  CBC   Recent Labs Lab 11/04/13 1458  WBC 7.1  HGB 11.9*  HCT 36.1  PLT 240  MCV 95.8  MCH 31.6  MCHC 33.0  RDW 15.4     CMP    Recent Labs Lab 11/04/13 1458  CREATININE 0.65     Anemia panel:  No results found for this basename: VITAMINB12, FOLATE, FERRITIN, TIBC, IRON, RETICCTPCT,  in the last 72 hours   Imaging Studies:  Mr Thoracic Spine W Wo Contrast  11/04/2013   CLINICAL DATA:  Spine lesions.  Paraplegia.  EXAM: MRI THORACIC SPINE WITHOUT AND WITH CONTRAST  TECHNIQUE: Multiplanar and multiecho pulse  sequences of the thoracic spine were obtained without and with intravenous contrast.  CONTRAST:  13m MULTIHANCE GADOBENATE DIMEGLUMINE 529 MG/ML IV SOLN  COMPARISON:  MRI of the thoracic spine 05/13/2012.  FINDINGS: A homogeneously enhancing mass lesion within the posterior right T10 vertebral body extends into the spinal canal and narrows the cord to 4.5 mm in AP diameter. The lesion measures 3.6 x 3.7 x 3.3 cm. Abnormal cord signal is evident 1.7 cm above can below the compression.  Enhancing tumor extends superiorly into the T9 vertebral body on the right there is also a focus of enhancement within the lateral left lamina and medial left transverse process of T9.  A focal area of enhancement along the inferior aspect of the T5 measures 6.5 mm. There is a 7 mm focus of enhancement along the posterior superior endplate of TY78 A 7 mm lesion is evident within the anterior central aspect of the T12 vertebral body. These lesions are all Katherine since the prior study apart from a focal area of signal abnormality within the right aspect of the T10 vertebral body.  A remote fracture is noted at T9. Patient is status post spinal augmentation at T6. A hemangioma along the superior endplate of T1 is stable.  No significant focal paraspinous lesions are present.  T1-2:  Mild disc bulging is present without significant stenosis  T2-3: A mild disc protrusion is present without significant stenosis.  T3-4: A left paramedian disc protrusion is stable. There is no significant stenosis.  T4-5: Negative.  T5-6:  Negative.  T6-7: Retropulsed bone partially effaces the ventral CSF. Mild right foraminal stenosis is evident.  T7-8:  Negative.  T8-9:  Negative.  T9-10: As stated above, there is severe right foraminal stenosis secondary to tumor invasion. Mild osseous narrowing is present on the left.  T10-11: Moderate right and mild left foraminal stenosis is secondary to extraosseous extension of tumor.  T11-12: Asymmetric left-sided  facet hypertrophy were results in mild left foraminal stenosis. The central canal and right  foramen are patent.  T12-L1: A central disc protrusion is present without significant stenosis.  L1-2: Moderate facet spurring is again noted. Left greater right foraminal narrowing is evident.  IMPRESSION: 1. 3.7 cm homogeneously enhancing mass lesion is centered within the right posterior aspect of the T10 vertebral body. 2. Extensive extraosseous tumor extension with severe foraminal narrowing on the right at T9-10 and mild foraminal narrowing bilaterally at T10-11. 3. Severe central canal stenosis at the T10 level with abnormal cord signal extending 1.7 cm above and below the area of stenosis. The cord is narrowed to 4.5 mm. 4. Additional enhancing lesions are present at T5, T11, and T12. The multiple lesions suggest metastatic disease. Multiple myeloma is also considered. Critical Value/emergent results were called by telephone at the time of interpretation on 11/04/2013 at 6:39 pm to Dr. Kary Kos, who verbally acknowledged these results.   Electronically Signed   By: Lawrence Santiago M.D.   On: 11/04/2013 18:39      A/P: 80 y.o. female with   Metastatic Bone Lesions with cord compression Unknown etiology at this time.  CT of the chest, abdomen and pelvis pending.  May need Radiation Oncology consultation in the setting of cord compression  Based on the findings further recommendations to proceed, including tissue biopsy labs including LDH.   Full Code  Other medical issues as per admitting team.   **Disclaimer: This note was dictated with voice recognition software. Similar sounding words can inadvertently be transcribed and this note may contain transcription errors which may not have been corrected upon publication of note.** WERTMAN,SARA E, PA-C 11/05/2013 10:16 AM  ADDENDUM:  89 with a history of T6 kyphoplasty (04/2012) s/p lumbar laminectomy (60/6770) complicated by epidural hematoma s/p  evacuation admitted from rehab following progressive lower extremity weakness, paraplegia over several weeks with thoracic MRI demonstrating presumed metastatic disease for further evaluation.  We are being consulted for probable further workup recommendation.  CT of C/A/P (7/16) and CT head (05/08) reviewed by me without evidence of intra thoracic or intra abdominal disease or intracranial abnormalaties.   We will obtain SPEP plus IFE, UPEP, Kappa lambda chains to rule out multiple myeloma.  We also recommend radiation oncology consult for palliative XRT to T10 tumor given patient expresses pain to the back.  Patient is likely to require a tissue biopsy if above work up not revealing and if she elects against hospice care.  We will follow Ms. Bomkamp with you. We will discuss the above recommendations with her [place] [phone].  We will call Dr. Arnoldo Morale, who is in surgery, with an update as well.   I personally saw this patient and performed a substantive portion of this encounter with the listed APP documented above.   Buddie Marston, MD

## 2013-11-06 DIAGNOSIS — C799 Secondary malignant neoplasm of unspecified site: Secondary | ICD-10-CM | POA: Diagnosis present

## 2013-11-06 DIAGNOSIS — Z9889 Other specified postprocedural states: Secondary | ICD-10-CM

## 2013-11-06 DIAGNOSIS — C801 Malignant (primary) neoplasm, unspecified: Secondary | ICD-10-CM

## 2013-11-06 DIAGNOSIS — Z515 Encounter for palliative care: Secondary | ICD-10-CM

## 2013-11-06 DIAGNOSIS — T148XXA Other injury of unspecified body region, initial encounter: Secondary | ICD-10-CM

## 2013-11-06 DIAGNOSIS — G8220 Paraplegia, unspecified: Secondary | ICD-10-CM

## 2013-11-06 LAB — KAPPA/LAMBDA LIGHT CHAINS
Kappa free light chain: 1.71 mg/dL (ref 0.33–1.94)
Kappa, lambda light chain ratio: 0.74 (ref 0.26–1.65)
Lambda free light chains: 2.31 mg/dL (ref 0.57–2.63)

## 2013-11-06 MED ORDER — SERTRALINE HCL 50 MG PO TABS
50.0000 mg | ORAL_TABLET | ORAL | Status: AC
  Administered 2013-11-06: 50 mg via ORAL
  Filled 2013-11-06: qty 1

## 2013-11-06 MED ORDER — DIAZEPAM 2 MG PO TABS
2.0000 mg | ORAL_TABLET | Freq: Four times a day (QID) | ORAL | Status: DC | PRN
  Administered 2013-11-06: 2 mg via ORAL
  Filled 2013-11-06: qty 1

## 2013-11-06 MED ORDER — SERTRALINE HCL 50 MG PO TABS
50.0000 mg | ORAL_TABLET | Freq: Every day | ORAL | Status: DC
  Filled 2013-11-06: qty 1

## 2013-11-06 MED ORDER — MORPHINE SULFATE (CONCENTRATE) 10 MG /0.5 ML PO SOLN: 5.0000 mg | ORAL | Status: DC | PRN

## 2013-11-06 NOTE — Consult Note (Signed)
[facility] Medical Consultation  Katherine Rubio IDP:[id number] DOB: 09-01-1922 DOA: 11/04/2013 PCP: Katherine Rubio., NP   Requesting physician: Katherine Rubio Date of consultation: 11/06/2013 Reason for consultation: Metastatic Cancer  Impression/Recommendations Principal Problem:   Metastatic cancer Active Problems:   Paraplegia   BACK PAIN, LUMBAR, CHRONIC   Neurogenic claudication due to lumbar spinal stenosis   S/P lumbar laminectomy    1. Metastatic Cancer. Patient having a evidence metastatic disease on a MRI of thoracic spine, suspicious for multiple myeloma. Pending labs. Rubio and Rad/Onc consulted. Treatment options were discussed with patient including role of radiation therapy. Katherine Rubio felt that the likelihood of recovering the neurologic function of her lower extremities was low and that radiation therapy possibly may not change the endpoint. It appears that the risks of radiation therapy could outweigh the benefits in Katherine Rubio's case. Katherine Rubio is quite adamant  about not undergoing chemotherapy as she states that she is fully aware of the potential adverse affects. She tells me that she would like to focus her care on comfort rather than pursuing further treatments. I discussed this her [place] ([phone]) who fully supports her wishes for focusing care on comfort. They agree to a Palliative care Consultation.   2. Family Communication: I spoke to Katherine Rubio [phone] her HCPOA, who would like to be present for any discussions pertaining to Katherine Rubio's care.  3. Code Status: Will change patient's code status to DNR  I will followup again tomorrow. Please contact me if I can be of assistance in the meanwhile. Thank you for this consultation.  Chief Complaint: Paraplegia  HPI:  Patient is a pleasant 80 year old female with a past medical history of chronic lower back pain, hypertension, osteoarthritis, history of  vertebral compression fracture, admitted to the neurosurgical service on 11/04/2013. Katherine Rubio requesting that the Medicine service take over patient's primary care. She underwent hypoplasty for T6 compression fracture performed on 05/15/2013, procedure performed by Katherine Rubio. There were no immediate complications. She complained of ongoing back pain and sciatica for which she underwent lumbar laminectomy in April of 2015. This was complicated by the development of epidural hematoma for which she was taken back to OR for evacuation. MRI of lumbar spine performed on 08/22/2013 showed no evidence for CSF leak, subcutaneous fluid collection, postoperative instability or subluxation. Lumbar intervertebral discs demonstrate moderate to severe degeneration from L1-2 through  L5-S1, unchanged from 07/25/2013. There was a 14 x 45 x 84 mm mixed signal intensity fluid collection compressing  the thecal sac consistent with a postoperative epidural hematoma. No evidence of malignancy. Over the past month patient has developed progressive bilateral lower extremity weakness with associated loss of sensation as well as urinary incontinence. Because of these symptoms she had an MRI of thoracic spine on 11/04/2013 that showed multiple lesions consistent with metastatic disease. Katherine Katherine Rubio and Katherine Rubio of [place] consulted.     Review of Systems:  Constitutional:  No weight loss, night sweats, Fevers, chills.  HEENT:  No headaches, Difficulty swallowing,Tooth/dental problems,Sore throat,  No sneezing, itching, ear ache, nasal congestion, post nasal drip,  Cardio-vascular:  No chest pain, Orthopnea, PND, swelling in lower extremities, anasarca, dizziness, palpitations  GI:  No heartburn, indigestion, abdominal pain, nausea, vomiting, diarrhea, change in bowel habits, loss of appetite  Resp:  No shortness of breath with exertion or at rest. No excess mucus, no productive cough, No non-productive cough, No  coughing up of blood.No change  in color of mucus.No wheezing.No chest wall deformity  Skin:  no rash or lesions.  GU:  Positive for urinary incontinence. No dysuria, change in color of urine, no urgency or frequency. No flank pain.  Musculoskeletal:  No joint pain or swelling. No decreased range of motion. Positive for back pain and paraplegia Psych:  No change in mood or affect. No depression or anxiety. No memory loss.   Past Medical History  Diagnosis Date  . Depression   . Hyperlipidemia   . Hypertension   . Chronic low back pain   . PONV (postoperative nausea and vomiting)   . GERD (gastroesophageal reflux disease)   . Osteoarthritis    Past Surgical History  Procedure Laterality Date  . Back surgery    . Knee surgery      left knee replacement  . Eye surgery      BIL CATARACT  . Kyphoplasty  05/15/2012    Procedure: KYPHOPLASTY;  Surgeon: Katherine Charter, MD;  Location: [place] [facility];  Service: Neurosurgery;  Laterality: N/A;  Thoracic Six Kyphoplasty  . Lumbar laminectomy  08/17/2013    L 1 L2 L3 L4 L5         Katherine Rubio  . Lumbar laminectomy/decompression microdiscectomy N/A 08/17/2013    Procedure: LUMBAR LAMINECTOMY/DECOMPRESSION MICRODISCECTOMY [place] TWO-THREE,LUMBAR THREE-FOUR,LUMBAR FOUR-FIVE;  Surgeon: Katherine Charter, MD;  Location: [place] [facility];  Service: Neurosurgery;  Laterality: N/A;  . Lumbar wound debridement N/A 08/22/2013    Procedure: EXPLORATION OF LUMBAR WOUND WITH REMOVAL OF EPIDURAL HEMATOMA, Repair of pseudomeningocel;  Surgeon: Katherine Moore, MD;  Location: [place] [facility];  Service: Neurosurgery;  Laterality: N/A;   Social History:  reports that she has never smoked. She has never used smokeless tobacco. She reports that she does not drink alcohol or use illicit drugs.  No Known Allergies Family History  Problem Relation Age of Onset  . Lung cancer Brother     deceased  . Stroke Father     deceased age 48  . Pulmonary  fibrosis Son     deceased  . Osteoarthritis Brother     Prior to Admission medications   Medication Sig Start Date End Date Taking? Authorizing Provider  acetaminophen (TYLENOL) 500 MG tablet Take 1,000 mg by mouth every 6 (six) hours.   Yes Historical Provider, MD  ibandronate (BONIVA) 150 MG tablet Take 150 mg by mouth every 30 (thirty) days.  10/08/13  Yes Historical Provider, MD  miconazole (MICOTIN) 2 % powder Apply 1 application topically as needed for itching (under breasts after bath until affected area is clear).   Yes Historical Provider, MD  mirtazapine (REMERON) 15 MG tablet Take 15 mg by mouth at bedtime.  11/03/13  Yes Historical Provider, MD  omeprazole (PRILOSEC) 20 MG capsule Take 20 mg by mouth daily.  11/03/13  Yes Historical Provider, MD  ondansetron (ZOFRAN-ODT) 4 MG disintegrating tablet Take 4-8 mg by mouth every 8 (eight) hours as needed for nausea or vomiting.   Yes Historical Provider, MD  polyethylene glycol (MIRALAX / GLYCOLAX) packet Take 17 g by mouth daily.   Yes Historical Provider, MD  sertraline (ZOLOFT) 50 MG tablet Take 1 tablet (50 mg total) by mouth daily. 09/11/13  Yes Debbrah Alar, NP  traMADol (ULTRAM) 50 MG tablet Take 1 tablet (50 mg total) by mouth every 6 (six) hours as needed for moderate pain. 08/31/13  Yes Katherine Charter, MD   Physical Exam: Blood pressure 118/74, pulse  95, temperature 98.3 F (36.8 C), temperature source Oral, resp. rate 18, height 5' 3" (1.6 m), weight 81.647 kg (180 lb), SpO2 94.00%. Filed Vitals:   11/06/13 0545  BP: 118/74  Pulse: 95  Temp: 98.3 F (36.8 C)  Resp: 18     General:  She does not appear to be in distress, awake and alert, orientated to person and place.   Eyes: Pupils equal round and reactive to light, extra-occular movement in tact.   ENT: Hydrated oral mucosa, no oral lesions  Neck: Supple, symmetrical, no stiffness  Cardiovascular: Regular rate and rhythm normal S1S2, no extremity  edema  Respiratory: Clear to auscultation bilaterally, normal inspiratory effort, breathing comfortably on room air  Abdomen: Soft nontender nondistended  Skin: No rashes or lesions  Musculoskeletal: No cyanosis clubbing or edema  Psychiatric: She is awake and alert, appropriate, following commands  Neurologic: Her cranial nerves 2-12 is grossly in tact, no tongue deviation, no slurred speech. 5/5 muscle strength to upper extremities, has complete paralysis to lower extremities with absence sensation.   Labs on Admission:  Basic Metabolic Panel:  Recent Labs Lab 11/04/13 1458  CREATININE 0.65   Liver Function Tests: No results found for this basename: AST, ALT, ALKPHOS, BILITOT, PROT, ALBUMIN,  in the last 168 hours No results found for this basename: LIPASE, AMYLASE,  in the last 168 hours No results found for this basename: AMMONIA,  in the last 168 hours CBC:  Recent Labs Lab 11/04/13 1458  WBC 7.1  HGB 11.9*  HCT 36.1  MCV 95.8  PLT 240   Cardiac Enzymes: No results found for this basename: CKTOTAL, CKMB, CKMBINDEX, TROPONINI,  in the last 168 hours BNP: No components found with this basename: POCBNP,  CBG: No results found for this basename: GLUCAP,  in the last 168 hours  Radiological Exams on Admission: Ct Chest W Contrast  11/05/2013   CLINICAL DATA:  Thoracic metastatic disease, thoracic spine mass  EXAM: CT CHEST, ABDOMEN, AND PELVIS WITH CONTRAST  TECHNIQUE: Multidetector CT imaging of the chest, abdomen and pelvis was performed following the standard protocol during bolus administration of intravenous contrast.  CONTRAST:  156m OMNIPAQUE IOHEXOL 300 MG/ML  SOLN  COMPARISON:  None.  FINDINGS: CT CHEST FINDINGS  The central airways are patent. The lungs are clear. There is no pleural effusion or pneumothorax.  There are no pathologically enlarged axillary, hilar or mediastinal lymph nodes.  The heart size is normal. There is no pericardial effusion. The  thoracic aorta is normal in caliber. There is coronary artery atherosclerosis involving the LAD and circumflex. There is mitral annular calcification. There is aortic valvular calcification.  Review of bone windows demonstrates a homogeneously enhancing mass in the right posterior aspect of the T10 vertebral body with tumor extension into the foramen and spinal canal. There is a possible small lesion along the inferior posterior endplate of the T9 vertebral body. The smaller lesions demonstrated on recent thoracolumbar spine MRI are not well delineated. There are compression fractures of the T6 and T9 vertebral bodies.  CT ABDOMEN AND PELVIS FINDINGS  The liver demonstrates no focal abnormality. There is no intrahepatic or extrahepatic biliary ductal dilatation. There is cholelithiasis. The common bile duct is dilated measuring 14.4 mm in diameter. The spleen demonstrates no focal abnormality.There is a 14 x 10 mm hypodense interpolar left renal mass most consistent with a cyst. The right kidney, adrenal glands and pancreas are normal. The bladder is unremarkable.  The stomach, duodenum, small  intestine, and large intestine demonstrate no contrast extravasation or dilatation. There is no pneumoperitoneum, pneumatosis, or portal venous gas. There is no abdominal or pelvic free fluid. There is no lymphadenopathy.  The abdominal aorta is normal in caliber with atherosclerosis.  There is a L3 compression fracture. There is posterior lumbar decompression from L2 through L4 with postsurgical changes in the posterior paraspinal musculature. The small lesions demonstrated on recent thoracolumbar spine MRI are not well delineated.  IMPRESSION: 1. No evidence of intrathoracic metastatic disease. No evidence of abdominal or pelvic metastatic disease. 2. Osseous malignancy most severely involving the T10 vertebral body which may reflect metastatic disease versus multiple myeloma. The smaller lesions demonstrated on recent  thoracolumbar spine MRI are not well delineated. 3. Cholelithiasis with mild common bile duct dilatation. No definite choledocholithiasis.   Electronically Signed   By: Kathreen Devoid   On: 11/05/2013 11:55   Mr Thoracic Spine W Wo Contrast  11/04/2013   CLINICAL DATA:  Spine lesions.  Paraplegia.  EXAM: MRI THORACIC SPINE WITHOUT AND WITH CONTRAST  TECHNIQUE: Multiplanar and multiecho pulse sequences of the thoracic spine were obtained without and with intravenous contrast.  CONTRAST:  58m MULTIHANCE GADOBENATE DIMEGLUMINE 529 MG/ML IV SOLN  COMPARISON:  MRI of the thoracic spine 05/13/2012.  FINDINGS: A homogeneously enhancing mass lesion within the posterior right T10 vertebral body extends into the spinal canal and narrows the cord to 4.5 mm in AP diameter. The lesion measures 3.6 x 3.7 x 3.3 cm. Abnormal cord signal is evident 1.7 cm above can below the compression.  Enhancing tumor extends superiorly into the T9 vertebral body on the right there is also a focus of enhancement within the lateral left lamina and medial left transverse process of T9.  A focal area of enhancement along the inferior aspect of the T5 measures 6.5 mm. There is a 7 mm focus of enhancement along the posterior superior endplate of TD32 A 7 mm lesion is evident within the anterior central aspect of the T12 vertebral body. These lesions are all new since the prior study apart from a focal area of signal abnormality within the right aspect of the T10 vertebral body.  A remote fracture is noted at T9. Patient is status post spinal augmentation at T6. A hemangioma along the superior endplate of T1 is stable.  No significant focal paraspinous lesions are present.  T1-2:  Mild disc bulging is present without significant stenosis  T2-3: A mild disc protrusion is present without significant stenosis.  T3-4: A left paramedian disc protrusion is stable. There is no significant stenosis.  T4-5: Negative.  T5-6:  Negative.  T6-7: Retropulsed bone  partially effaces the ventral CSF. Mild right foraminal stenosis is evident.  T7-8:  Negative.  T8-9:  Negative.  T9-10: As stated above, there is severe right foraminal stenosis secondary to tumor invasion. Mild osseous narrowing is present on the left.  T10-11: Moderate right and mild left foraminal stenosis is secondary to extraosseous extension of tumor.  T11-12: Asymmetric left-sided facet hypertrophy were results in mild left foraminal stenosis. The central canal and right foramen are patent.  T12-L1: A central disc protrusion is present without significant stenosis.  L1-2: Moderate facet spurring is again noted. Left greater right foraminal narrowing is evident.  IMPRESSION: 1. 3.7 cm homogeneously enhancing mass lesion is centered within the right posterior aspect of the T10 vertebral body. 2. Extensive extraosseous tumor extension with severe foraminal narrowing on the right at T9-10 and mild foraminal narrowing  bilaterally at T10-11. 3. Severe central canal stenosis at the T10 level with abnormal cord signal extending 1.7 cm above and below the area of stenosis. The cord is narrowed to 4.5 mm. 4. Additional enhancing lesions are present at T5, T11, and T12. The multiple lesions suggest metastatic disease. Multiple myeloma is also considered. Critical Value/emergent results were called by telephone at the time of interpretation on 11/04/2013 at 6:39 pm to Katherine. Kary Kos, who verbally acknowledged these results.   Electronically Signed   By: Lawrence Santiago M.D.   On: 11/04/2013 18:39   Ct Abdomen Pelvis W Contrast  11/05/2013   CLINICAL DATA:  Thoracic metastatic disease, thoracic spine mass  EXAM: CT CHEST, ABDOMEN, AND PELVIS WITH CONTRAST  TECHNIQUE: Multidetector CT imaging of the chest, abdomen and pelvis was performed following the standard protocol during bolus administration of intravenous contrast.  CONTRAST:  1109m OMNIPAQUE IOHEXOL 300 MG/ML  SOLN  COMPARISON:  None.  FINDINGS: CT CHEST FINDINGS   The central airways are patent. The lungs are clear. There is no pleural effusion or pneumothorax.  There are no pathologically enlarged axillary, hilar or mediastinal lymph nodes.  The heart size is normal. There is no pericardial effusion. The thoracic aorta is normal in caliber. There is coronary artery atherosclerosis involving the LAD and circumflex. There is mitral annular calcification. There is aortic valvular calcification.  Review of bone windows demonstrates a homogeneously enhancing mass in the right posterior aspect of the T10 vertebral body with tumor extension into the foramen and spinal canal. There is a possible small lesion along the inferior posterior endplate of the T9 vertebral body. The smaller lesions demonstrated on recent thoracolumbar spine MRI are not well delineated. There are compression fractures of the T6 and T9 vertebral bodies.  CT ABDOMEN AND PELVIS FINDINGS  The liver demonstrates no focal abnormality. There is no intrahepatic or extrahepatic biliary ductal dilatation. There is cholelithiasis. The common bile duct is dilated measuring 14.4 mm in diameter. The spleen demonstrates no focal abnormality.There is a 14 x 10 mm hypodense interpolar left renal mass most consistent with a cyst. The right kidney, adrenal glands and pancreas are normal. The bladder is unremarkable.  The stomach, duodenum, small intestine, and large intestine demonstrate no contrast extravasation or dilatation. There is no pneumoperitoneum, pneumatosis, or portal venous gas. There is no abdominal or pelvic free fluid. There is no lymphadenopathy.  The abdominal aorta is normal in caliber with atherosclerosis.  There is a L3 compression fracture. There is posterior lumbar decompression from L2 through L4 with postsurgical changes in the posterior paraspinal musculature. The small lesions demonstrated on recent thoracolumbar spine MRI are not well delineated.  IMPRESSION: 1. No evidence of intrathoracic  metastatic disease. No evidence of abdominal or pelvic metastatic disease. 2. Osseous malignancy most severely involving the T10 vertebral body which may reflect metastatic disease versus multiple myeloma. The smaller lesions demonstrated on recent thoracolumbar spine MRI are not well delineated. 3. Cholelithiasis with mild common bile duct dilatation. No definite choledocholithiasis.   Electronically Signed   By: HKathreen Devoid  On: 11/05/2013 11:55    EKG: Independently reviewed.   Time spent: 60  min  ZKelvin Cellar[facility] Hospitalists Pager [phone] If 7PM-7AM, please contact night-coverage [email or web address] Password [email or web address]/17/2015, 12:24 PM

## 2013-11-06 NOTE — Progress Notes (Signed)
Patient ID: Katherine Rubio, female   DOB: [birth year], 80 y.o.   MRN: [id number] Subjective:  The patient is alert and pleasant. She is in no apparent distress.  Objective: Vital signs in last 24 hours: Temp:  [98.3 F (36.8 C)-98.9 F (37.2 C)] 98.5 F (36.9 C) (07/17 1415) Pulse Rate:  [95-107] 106 (07/17 1415) Resp:  [17-18] 17 (07/17 1415) BP: (104-118)/(67-74) 116/71 mmHg (07/17 1415) SpO2:  [94 %-96 %] 95 % (07/17 1415)  Intake/Output from previous day: 07/16 0701 - 07/17 0700 In: 1120 [P.O.:120; I.V.:1000] Out: -  Intake/Output this shift: Total I/O In: 1070 [P.O.:470; I.V.:600] Out: -   Physical exam the patient is alert and oriented. She is paraplegic.  Lab Results:  Recent Labs  11/04/13 1458  WBC 7.1  HGB 11.9*  HCT 36.1  PLT 240   BMET  Recent Labs  11/04/13 1458  CREATININE 0.65    Studies/Results: Ct Chest W Contrast  11/05/2013   CLINICAL DATA:  Thoracic metastatic disease, thoracic spine mass  EXAM: CT CHEST, ABDOMEN, AND PELVIS WITH CONTRAST  TECHNIQUE: Multidetector CT imaging of the chest, abdomen and pelvis was performed following the standard protocol during bolus administration of intravenous contrast.  CONTRAST:  183m OMNIPAQUE IOHEXOL 300 MG/ML  SOLN  COMPARISON:  None.  FINDINGS: CT CHEST FINDINGS  The central airways are patent. The lungs are clear. There is no pleural effusion or pneumothorax.  There are no pathologically enlarged axillary, hilar or mediastinal lymph nodes.  The heart size is normal. There is no pericardial effusion. The thoracic aorta is normal in caliber. There is coronary artery atherosclerosis involving the LAD and circumflex. There is mitral annular calcification. There is aortic valvular calcification.  Review of bone windows demonstrates a homogeneously enhancing mass in the right posterior aspect of the T10 vertebral body with tumor extension into the foramen and spinal canal. There is a possible small lesion  along the inferior posterior endplate of the T9 vertebral body. The smaller lesions demonstrated on recent thoracolumbar spine MRI are not well delineated. There are compression fractures of the T6 and T9 vertebral bodies.  CT ABDOMEN AND PELVIS FINDINGS  The liver demonstrates no focal abnormality. There is no intrahepatic or extrahepatic biliary ductal dilatation. There is cholelithiasis. The common bile duct is dilated measuring 14.4 mm in diameter. The spleen demonstrates no focal abnormality.There is a 14 x 10 mm hypodense interpolar left renal mass most consistent with a cyst. The right kidney, adrenal glands and pancreas are normal. The bladder is unremarkable.  The stomach, duodenum, small intestine, and large intestine demonstrate no contrast extravasation or dilatation. There is no pneumoperitoneum, pneumatosis, or portal venous gas. There is no abdominal or pelvic free fluid. There is no lymphadenopathy.  The abdominal aorta is normal in caliber with atherosclerosis.  There is a L3 compression fracture. There is posterior lumbar decompression from L2 through L4 with postsurgical changes in the posterior paraspinal musculature. The small lesions demonstrated on recent thoracolumbar spine MRI are not well delineated.  IMPRESSION: 1. No evidence of intrathoracic metastatic disease. No evidence of abdominal or pelvic metastatic disease. 2. Osseous malignancy most severely involving the T10 vertebral body which may reflect metastatic disease versus multiple myeloma. The smaller lesions demonstrated on recent thoracolumbar spine MRI are not well delineated. 3. Cholelithiasis with mild common bile duct dilatation. No definite choledocholithiasis.   Electronically Signed   By: HKathreen Devoid  On: 11/05/2013 11:55   Ct Abdomen Pelvis W Contrast  11/05/2013   CLINICAL DATA:  Thoracic metastatic disease, thoracic spine mass  EXAM: CT CHEST, ABDOMEN, AND PELVIS WITH CONTRAST  TECHNIQUE: Multidetector CT imaging of  the chest, abdomen and pelvis was performed following the standard protocol during bolus administration of intravenous contrast.  CONTRAST:  132m OMNIPAQUE IOHEXOL 300 MG/ML  SOLN  COMPARISON:  None.  FINDINGS: CT CHEST FINDINGS  The central airways are patent. The lungs are clear. There is no pleural effusion or pneumothorax.  There are no pathologically enlarged axillary, hilar or mediastinal lymph nodes.  The heart size is normal. There is no pericardial effusion. The thoracic aorta is normal in caliber. There is coronary artery atherosclerosis involving the LAD and circumflex. There is mitral annular calcification. There is aortic valvular calcification.  Review of bone windows demonstrates a homogeneously enhancing mass in the right posterior aspect of the T10 vertebral body with tumor extension into the foramen and spinal canal. There is a possible small lesion along the inferior posterior endplate of the T9 vertebral body. The smaller lesions demonstrated on recent thoracolumbar spine MRI are not well delineated. There are compression fractures of the T6 and T9 vertebral bodies.  CT ABDOMEN AND PELVIS FINDINGS  The liver demonstrates no focal abnormality. There is no intrahepatic or extrahepatic biliary ductal dilatation. There is cholelithiasis. The common bile duct is dilated measuring 14.4 mm in diameter. The spleen demonstrates no focal abnormality.There is a 14 x 10 mm hypodense interpolar left renal mass most consistent with a cyst. The right kidney, adrenal glands and pancreas are normal. The bladder is unremarkable.  The stomach, duodenum, small intestine, and large intestine demonstrate no contrast extravasation or dilatation. There is no pneumoperitoneum, pneumatosis, or portal venous gas. There is no abdominal or pelvic free fluid. There is no lymphadenopathy.  The abdominal aorta is normal in caliber with atherosclerosis.  There is a L3 compression fracture. There is posterior lumbar  decompression from L2 through L4 with postsurgical changes in the posterior paraspinal musculature. The small lesions demonstrated on recent thoracolumbar spine MRI are not well delineated.  IMPRESSION: 1. No evidence of intrathoracic metastatic disease. No evidence of abdominal or pelvic metastatic disease. 2. Osseous malignancy most severely involving the T10 vertebral body which may reflect metastatic disease versus multiple myeloma. The smaller lesions demonstrated on recent thoracolumbar spine MRI are not well delineated. 3. Cholelithiasis with mild common bile duct dilatation. No definite choledocholithiasis.   Electronically Signed   By: HKathreen Devoid  On: 11/05/2013 11:55    Assessment/Plan: T10 metastasis, paraplegic: I discussed the situation with the patient and her power of attorney/nephew, SRichardson Landry I have answered all their questions. They have elected no further workup or treatment. This is entirely reasonable. Arrangements are being made for hospice care at home. The discharge date is likely tomorrow. I will sign off. I appreciate everyone's help. Please let me know if I can be of further assistance.  LOS: 2 days     Lexa Coronado D 11/06/2013, 6:51 PM

## 2013-11-06 NOTE — Progress Notes (Signed)
Medical oncology Patient and family and HCPOA (Mr. Wendall Mola) requested palliative care and hospice care. They appreciated our assistance in the this case and declines any additional work up or treatment.   I made him aware to please call us back with questions.  I will sign off.

## 2013-11-06 NOTE — Consult Note (Signed)
Patient WL:[id number] B XXXArrington      DOB: 14-Feb-1923      HER:[id number]     Consult Note from the Palliative Medicine Team at [facility] Requested by: Dr. Coralyn Pear     PCP: Nance Pear., NP Reason for Consultation: [place] and options     Phone Number:[phone]  Assessment of patients Current state: I met today with Katherine Rubio, nephew and HCPOA- Wendall Mola, and niece Otila Kluver. Family is very supportive. We discuss her goals which she eventually proclaimed that she would like to go home. She does NOT want chemotherapy and has decided with great difficulty on no radiation therapy. She is concerned about "being a burden." Her family is extremely supportive and are able to provide 24/7 caregivers in home to help with comfort measures. She is very tearful throughout the conversation. We completed MOST form: DNR, comfort measures (with no readmission), determine use/limitation of antibiotics, no IV fluids, no feeding tube. They are hopeful for home tomorrow. Comfort medications ordered and case management to help arrange hospice for in the home.    Goals of Care: 1.  Code Status: DNR   2. Scope of Treatment: She would like to get home as soon as she is able with focus on comfort measures with hospice and private caregivers.    4. Disposition: Home with hospice hopeful for tomorrow.    3. Symptom Management:   1. Anxiety: Continue home dose of Zoloft po 50 mg qhs (give today's dose now). Initiate diazepam 2 mg po every 6 hours prn anxiety/muscle spasms. 2. Pain: Roxanol 5 mg po every 2 hours prn pain/shortness of breath.  3. Bowel Regimen: Colace BID.  4. Nausea/Vomiting: Ondansetron every 6 hours prn nausea.   4. Psychosocial: Emotional support provided to patient and family during very difficult conversation.   5. Spiritual: She relies on her faith and has support from personal church and pastor.    Patient Documents Completed or Given: Document Given Completed   Advanced Directives Pkt    MOST  yes  DNR    Gone from [facility]    Hard Choices      Brief HPI: Katherine Rubio is a 80 yo female admitted with progressive bilateral extremity weakness. She has had kyphoplasty T6 compression fracture 04/2013. Ongoing sciatica/back pain with lumbar laminectomy 07/2013 resulting in epidural hematoma evacuated in OR and MRI lumbar spine 08/22/2013 showed no further complications or signs of malignancy but mod-severe degeneration L1-2 through L5-S1. MRI thoracic spine 11/04/13 shows multiple lesions consistent with metastatic disease. PMH significant for chronic lower back pain, HTN, osteoarthritis.    ROS: + lower back pain, + anxiety    PMH:  Past Medical History  Diagnosis Date  . Depression   . Hyperlipidemia   . Hypertension   . Chronic low back pain   . PONV (postoperative nausea and vomiting)   . GERD (gastroesophageal reflux disease)   . Osteoarthritis      PSH: Past Surgical History  Procedure Laterality Date  . Back surgery    . Knee surgery      left knee replacement  . Eye surgery      BIL CATARACT  . Kyphoplasty  05/15/2012    Procedure: KYPHOPLASTY;  Surgeon: [name], MD;  Location: [facility];  Service: Neurosurgery;  Laterality: N/A;  Thoracic Six Kyphoplasty  . Lumbar laminectomy  08/17/2013    L 1 L2 L3 L4 L5  DR Arnoldo Morale  . Lumbar laminectomy/decompression microdiscectomy N/A 08/17/2013    Procedure: LUMBAR LAMINECTOMY/DECOMPRESSION MICRODISCECTOMY [place] TWO-THREE,LUMBAR THREE-FOUR,LUMBAR FOUR-FIVE;  Surgeon: [name], MD;  Location: [facility];  Service: Neurosurgery;  Laterality: N/A;  . Lumbar wound debridement N/A 08/22/2013    Procedure: EXPLORATION OF LUMBAR WOUND WITH REMOVAL OF EPIDURAL HEMATOMA, Repair of pseudomeningocel;  Surgeon: Eustace Moore, MD;  Location: [facility];  Service: Neurosurgery;  Laterality: N/A;   I have reviewed the [place] and SH and  If appropriate  update it with new information. No Known Allergies Scheduled Meds: . docusate sodium  100 mg Oral BID  . enoxaparin (LOVENOX) injection  40 mg Subcutaneous Q24H  . [START ON 11/07/2013] sertraline  50 mg Oral QHS   Continuous Infusions: . lactated ringers 50 mL/hr at 11/06/13 0729   PRN Meds:.acetaminophen, acetaminophen, diazepam, HYDROcodone-acetaminophen, morphine CONCENTRATE, ondansetron (ZOFRAN) IV, ondansetron    BP 110/87  Pulse 107  Temp(Src) 98.5 F (36.9 C) (Oral)  Resp 18  Ht 5' 3"  (1.6 m)  Wt 81.647 kg (180 lb)  BMI 31.89 kg/m2  SpO2 96%   PPS: 30% at best   Intake/Output Summary (Last 24 hours) at 11/06/13 2310 Last data filed at 11/06/13 2200  Gross per 24 hour  Intake   1470 ml  Output      0 ml  Net   1470 ml   LBM: 11/06/13                         Physical Exam:  General: NAD, pleasant, tearful HEENT:  [place]/AT, no JVD, oral mucosa moist without exudate Chest: CTA throughout, no labored breathing, symmetric CVS: RRR, S1 S2 Abdomen: Soft, NT, ND, +BS Ext: BLE paralysis, no edema, BUE normal range of motion and strength 5/5 Neuro: Awake, alert, appropriate, oriented x 3, follows commands  Labs: CBC    Component Value Date/Time   WBC 7.1 11/04/2013 1458   RBC 3.77* 11/04/2013 1458   HGB 11.9* 11/04/2013 1458   HCT 36.1 11/04/2013 1458   PLT 240 11/04/2013 1458   MCV 95.8 11/04/2013 1458   MCH 31.6 11/04/2013 1458   MCHC 33.0 11/04/2013 1458   RDW 15.4 11/04/2013 1458   LYMPHSABS 1.7 08/24/2013 1950   MONOABS 1.0 08/24/2013 1950   EOSABS 0.0 08/24/2013 1950   BASOSABS 0.0 08/24/2013 1950    BMET    Component Value Date/Time   NA 138 08/24/2013 1950   K 4.1 08/24/2013 1950   CL 105 08/24/2013 1950   CO2 22 08/24/2013 1950   GLUCOSE 102* 08/24/2013 1950   GLUCOSE 97 02/28/2006 0825   BUN 22 08/24/2013 1950   CREATININE 0.65 11/04/2013 1458   CREATININE 0.95 01/12/2013 1107   CALCIUM 8.2* 08/24/2013 1950   GFRNONAA 76* 11/04/2013 1458   GFRAA 89* 11/04/2013 1458     CMP     Component Value Date/Time   NA 138 08/24/2013 1950   K 4.1 08/24/2013 1950   CL 105 08/24/2013 1950   CO2 22 08/24/2013 1950   GLUCOSE 102* 08/24/2013 1950   GLUCOSE 97 02/28/2006 0825   BUN 22 08/24/2013 1950   CREATININE 0.65 11/04/2013 1458   CREATININE 0.95 01/12/2013 1107   CALCIUM 8.2* 08/24/2013 1950   PROT 8.4* 01/12/2013 1107   ALBUMIN 4.1 01/12/2013 1107   AST 21 01/12/2013 1107   ALT 14 01/12/2013 1107   ALKPHOS 61 01/12/2013 1107   BILITOT 0.5  01/12/2013 1107   GFRNONAA 76* 11/04/2013 1458   GFRAA 89* 11/04/2013 1458     Time In Time Out Total Time Spent with Patient Total Overall Time  1515 1630 21mn 775m    Greater than 50%  of this time was spent counseling and coordinating care related to the above assessment and plan.  AlVinie SillNP Palliative Medicine Team Pager # [phone]-F 8a-5p) Team Phone # [phone]/Weekends)

## 2013-11-06 NOTE — Progress Notes (Signed)
Full note to follow:  I met today with Katherine Rubio, nephew and HCPOA- Steve Brown, and niece Tina. We discuss her goals which she eventually proclaimed that she would like to go home. She does NOT want chemotherapy and has decided with great difficulty on no radiation therapy. She is concerned about "being a burden." Her family is extremely supportive and are able to provide 24/7 caregivers in home to help with comfort measures. She is very tearful throughout the conversation. We completed MOST form: DNR, comfort measures (with no readmission), determine use/limitation of antibiotics, no IV fluids, no feeding tube. They are hopeful for home tomorrow. Comfort medications ordered and case management to help arrange hospice for in the home.    , NP Palliative Medicine Team Pager # [phone] (M-F 8a-5p) Team Phone # [phone] (Nights/Weekends) 

## 2013-11-07 DIAGNOSIS — M48062 Spinal stenosis, lumbar region with neurogenic claudication: Secondary | ICD-10-CM

## 2013-11-07 MED ORDER — DSS 100 MG PO CAPS: 100.0000 mg | ORAL_CAPSULE | Freq: Two times a day (BID) | ORAL | Status: AC

## 2013-11-07 MED ORDER — MORPHINE SULFATE (CONCENTRATE) 10 MG /0.5 ML PO SOLN: 5.0000 mg | ORAL | Status: AC | PRN

## 2013-11-07 NOTE — Progress Notes (Signed)
Discharged with sitter via ptar.

## 2013-11-07 NOTE — Discharge Summary (Signed)
Physician Discharge Summary  Katherine Rubio KXF:[id number] DOB: 05-01-1922 DOA: 11/04/2013  PCP: Nance Pear., NP  Admit date: 11/04/2013 Discharge date: 11/07/2013  Time spent: 35 minutes  Recommendations for Outpatient Follow-up:  1. Patient to be discharged to her home with home hospice services following  Discharge Diagnoses:  Principal Problem:   Metastatic cancer Active Problems:   Paraplegia   BACK PAIN, LUMBAR, CHRONIC   Neurogenic claudication due to lumbar spinal stenosis   S/P lumbar laminectomy   Discharge Condition: Stable  Diet recommendation: Regular Diet  Filed Weights   11/04/13 1417 11/04/13 1900  Weight: 81.647 kg (180 lb) 81.647 kg (180 lb)    History of present illness:  Patient is a pleasant 80 year old female with a past medical history of chronic lower back pain, hypertension, osteoarthritis, history of vertebral compression fracture, admitted to the neurosurgical service on 11/04/2013. Dr Arnoldo Morale requesting that the Medicine service take over patient's primary care. She underwent hypoplasty for T6 compression fracture performed on 05/15/2013, procedure performed by Dr Arnoldo Morale. There were no immediate complications. She complained of ongoing back pain and sciatica for which she underwent lumbar laminectomy in April of 2015. This was complicated by the development of epidural hematoma for which she was taken back to OR for evacuation. MRI of lumbar spine performed on 08/22/2013 showed no evidence for CSF leak, subcutaneous fluid collection, postoperative instability or subluxation. Lumbar intervertebral discs demonstrate moderate to severe degeneration from L1-2 through L5-S1, unchanged from 07/25/2013. There was a 14 x 45 x 84 mm mixed signal intensity fluid collection compressing the thecal sac consistent with a postoperative epidural hematoma. No evidence of malignancy. Over the past month patient has developed progressive bilateral lower extremity  weakness with associated loss of sensation as well as urinary incontinence. Because of these symptoms she had an MRI of thoracic spine on 11/04/2013 that showed multiple lesions consistent with metastatic disease. Dr Juliann Mule of Med/Onc and Dr Pablo Ledger of [facility] consulted.    Hospital Course:  1. Metastatic Cancer. Patient having a evidence metastatic disease on a MRI of thoracic spine, suspicious for multiple myeloma. Med/Onc and Rad/Onc consulted. Treatment options were discussed with patient including role of radiation therapy. Dr Pablo Ledger felt that the likelihood of recovering the neurologic function of her lower extremities was low and that radiation therapy possibly may not change the endpoint. It appears that the risks of radiation therapy could outweigh the benefits in Katherine Rubio case. Katherine Rubio is quite adamant about not undergoing chemotherapy as she states that she is fully aware of the potential adverse affects. She tells me that she would like to focus her care on comfort rather than pursuing further treatments. I discussed this her Hustler ([phone]) who fully supports her wishes for focusing care on comfort. Palliative care Consulted. Goals of care discussed as she expressed her desire to go home, not wishing to to undergo further invasive treatments.   2. Family Communication: I spoke to Katherine Rubio [phone] her HCPOA 3. Code Status: DNR Patient was discharged to her home with home hospice services. Prior to discharge I spoke with her HCPOA Mr Owens Shark who fully supported Katherine Rubio's decision.    Consultations:  Palliative Care  Heme/Onc  Rad/Onc  Neurosurgery  Discharge Exam: Filed Vitals:   11/07/13 0532  BP: 112/57  Pulse: 89  Temp: 97.9 F (36.6 C)  Resp: 16    General: No acute distress, awake and alert, looking forward to going home Cardiovascular: Regular rate  and rhythm normal S1S2 Respiratory: Clear to auscultation bilaterally,  normal inspiratory effort.  Abdomen: Soft, nontender nondistened   Discharge Instructions You were cared for by a hospitalist during your hospital stay. If you have any questions about your discharge medications or the care you received while you were in the hospital after you are discharged, you can call the unit and asked to speak with the hospitalist on call if the hospitalist that took care of you is not available. Once you are discharged, your primary care physician will handle any further medical issues. Please note that NO REFILLS for any discharge medications will be authorized once you are discharged, as it is imperative that you return to your primary care physician (or establish a relationship with a primary care physician if you do not have one) for your aftercare needs so that they can reassess your need for medications and monitor your lab values.  Discharge Instructions   Call MD for:  difficulty breathing, headache or visual disturbances    Complete by:  As directed      Call MD for:  extreme fatigue    Complete by:  As directed      Call MD for:  persistant dizziness or light-headedness    Complete by:  As directed      Call MD for:  severe uncontrolled pain    Complete by:  As directed      Diet - low sodium heart healthy    Complete by:  As directed      Increase activity slowly    Complete by:  As directed             Medication List    STOP taking these medications       ibandronate 150 MG tablet  Commonly known as:  BONIVA     traMADol 50 MG tablet  Commonly known as:  ULTRAM      TAKE these medications       acetaminophen 500 MG tablet  Commonly known as:  TYLENOL  Take 1,000 mg by mouth every 6 (six) hours.     DSS 100 MG Caps  Take 100 mg by mouth 2 (two) times daily.     miconazole 2 % powder  Commonly known as:  MICOTIN  Apply 1 application topically as needed for itching (under breasts after bath until affected area is clear).      mirtazapine 15 MG tablet  Commonly known as:  REMERON  Take 15 mg by mouth at bedtime.     morphine CONCENTRATE 10 mg / 0.5 ml concentrated solution  Take 0.25 mLs (5 mg total) by mouth every 4 (four) hours as needed for severe pain or shortness of breath.     omeprazole 20 MG capsule  Commonly known as:  PRILOSEC  Take 20 mg by mouth daily.     ondansetron 4 MG disintegrating tablet  Commonly known as:  ZOFRAN-ODT  Take 4-8 mg by mouth every 8 (eight) hours as needed for nausea or vomiting.     polyethylene glycol packet  Commonly known as:  MIRALAX / GLYCOLAX  Take 17 g by mouth daily.     sertraline 50 MG tablet  Commonly known as:  ZOLOFT  Take 1 tablet (50 mg total) by mouth daily.       No Known Allergies    The results of significant diagnostics from this hospitalization (including imaging, microbiology, ancillary and laboratory) are listed below for reference.  Significant Diagnostic Studies: Ct Chest W Contrast  11/05/2013   CLINICAL DATA:  Thoracic metastatic disease, thoracic spine mass  EXAM: CT CHEST, ABDOMEN, AND PELVIS WITH CONTRAST  TECHNIQUE: Multidetector CT imaging of the chest, abdomen and pelvis was performed following the standard protocol during bolus administration of intravenous contrast.  CONTRAST:  163m OMNIPAQUE IOHEXOL 300 MG/ML  SOLN  COMPARISON:  None.  FINDINGS: CT CHEST FINDINGS  The central airways are patent. The lungs are clear. There is no pleural effusion or pneumothorax.  There are no pathologically enlarged axillary, hilar or mediastinal lymph nodes.  The heart size is normal. There is no pericardial effusion. The thoracic aorta is normal in caliber. There is coronary artery atherosclerosis involving the LAD and circumflex. There is mitral annular calcification. There is aortic valvular calcification.  Review of bone windows demonstrates a homogeneously enhancing mass in the right posterior aspect of the T10 vertebral body with tumor  extension into the foramen and spinal canal. There is a possible small lesion along the inferior posterior endplate of the T9 vertebral body. The smaller lesions demonstrated on recent thoracolumbar spine MRI are not well delineated. There are compression fractures of the T6 and T9 vertebral bodies.  CT ABDOMEN AND PELVIS FINDINGS  The liver demonstrates no focal abnormality. There is no intrahepatic or extrahepatic biliary ductal dilatation. There is cholelithiasis. The common bile duct is dilated measuring 14.4 mm in diameter. The spleen demonstrates no focal abnormality.There is a 14 x 10 mm hypodense interpolar left renal mass most consistent with a cyst. The right kidney, adrenal glands and pancreas are normal. The bladder is unremarkable.  The stomach, duodenum, small intestine, and large intestine demonstrate no contrast extravasation or dilatation. There is no pneumoperitoneum, pneumatosis, or portal venous gas. There is no abdominal or pelvic free fluid. There is no lymphadenopathy.  The abdominal aorta is normal in caliber with atherosclerosis.  There is a L3 compression fracture. There is posterior lumbar decompression from L2 through L4 with postsurgical changes in the posterior paraspinal musculature. The small lesions demonstrated on recent thoracolumbar spine MRI are not well delineated.  IMPRESSION: 1. No evidence of intrathoracic metastatic disease. No evidence of abdominal or pelvic metastatic disease. 2. Osseous malignancy most severely involving the T10 vertebral body which may reflect metastatic disease versus multiple myeloma. The smaller lesions demonstrated on recent thoracolumbar spine MRI are not well delineated. 3. Cholelithiasis with mild common bile duct dilatation. No definite choledocholithiasis.   Electronically Signed   By: HKathreen Devoid  On: 11/05/2013 11:55   Mr Thoracic Spine W Wo Contrast  11/04/2013   CLINICAL DATA:  Spine lesions.  Paraplegia.  EXAM: MRI THORACIC SPINE  WITHOUT AND WITH CONTRAST  TECHNIQUE: Multiplanar and multiecho pulse sequences of the thoracic spine were obtained without and with intravenous contrast.  CONTRAST:  158mMULTIHANCE GADOBENATE DIMEGLUMINE 529 MG/ML IV SOLN  COMPARISON:  MRI of the thoracic spine 05/13/2012.  FINDINGS: A homogeneously enhancing mass lesion within the posterior right T10 vertebral body extends into the spinal canal and narrows the cord to 4.5 mm in AP diameter. The lesion measures 3.6 x 3.7 x 3.3 cm. Abnormal cord signal is evident 1.7 cm above can below the compression.  Enhancing tumor extends superiorly into the T9 vertebral body on the right there is also a focus of enhancement within the lateral left lamina and medial left transverse process of T9.  A focal area of enhancement along the inferior aspect of the T5 measures 6.5  mm. There is a 7 mm focus of enhancement along the posterior superior endplate of U23. A 7 mm lesion is evident within the anterior central aspect of the T12 vertebral body. These lesions are all new since the prior study apart from a focal area of signal abnormality within the right aspect of the T10 vertebral body.  A remote fracture is noted at T9. Patient is status post spinal augmentation at T6. A hemangioma along the superior endplate of T1 is stable.  No significant focal paraspinous lesions are present.  T1-2:  Mild disc bulging is present without significant stenosis  T2-3: A mild disc protrusion is present without significant stenosis.  T3-4: A left paramedian disc protrusion is stable. There is no significant stenosis.  T4-5: Negative.  T5-6:  Negative.  T6-7: Retropulsed bone partially effaces the ventral CSF. Mild right foraminal stenosis is evident.  T7-8:  Negative.  T8-9:  Negative.  T9-10: As stated above, there is severe right foraminal stenosis secondary to tumor invasion. Mild osseous narrowing is present on the left.  T10-11: Moderate right and mild left foraminal stenosis is secondary  to extraosseous extension of tumor.  T11-12: Asymmetric left-sided facet hypertrophy were results in mild left foraminal stenosis. The central canal and right foramen are patent.  T12-L1: A central disc protrusion is present without significant stenosis.  L1-2: Moderate facet spurring is again noted. Left greater right foraminal narrowing is evident.  IMPRESSION: 1. 3.7 cm homogeneously enhancing mass lesion is centered within the right posterior aspect of the T10 vertebral body. 2. Extensive extraosseous tumor extension with severe foraminal narrowing on the right at T9-10 and mild foraminal narrowing bilaterally at T10-11. 3. Severe central canal stenosis at the T10 level with abnormal cord signal extending 1.7 cm above and below the area of stenosis. The cord is narrowed to 4.5 mm. 4. Additional enhancing lesions are present at T5, T11, and T12. The multiple lesions suggest metastatic disease. Multiple myeloma is also considered. Critical Value/emergent results were called by telephone at the time of interpretation on 11/04/2013 at 6:39 pm to Dr. Kary Kos, who verbally acknowledged these results.   Electronically Signed   By: Lawrence Santiago M.D.   On: 11/04/2013 18:39   Ct Abdomen Pelvis W Contrast  11/05/2013   CLINICAL DATA:  Thoracic metastatic disease, thoracic spine mass  EXAM: CT CHEST, ABDOMEN, AND PELVIS WITH CONTRAST  TECHNIQUE: Multidetector CT imaging of the chest, abdomen and pelvis was performed following the standard protocol during bolus administration of intravenous contrast.  CONTRAST:  182m OMNIPAQUE IOHEXOL 300 MG/ML  SOLN  COMPARISON:  None.  FINDINGS: CT CHEST FINDINGS  The central airways are patent. The lungs are clear. There is no pleural effusion or pneumothorax.  There are no pathologically enlarged axillary, hilar or mediastinal lymph nodes.  The heart size is normal. There is no pericardial effusion. The thoracic aorta is normal in caliber. There is coronary artery atherosclerosis  involving the LAD and circumflex. There is mitral annular calcification. There is aortic valvular calcification.  Review of bone windows demonstrates a homogeneously enhancing mass in the right posterior aspect of the T10 vertebral body with tumor extension into the foramen and spinal canal. There is a possible small lesion along the inferior posterior endplate of the T9 vertebral body. The smaller lesions demonstrated on recent thoracolumbar spine MRI are not well delineated. There are compression fractures of the T6 and T9 vertebral bodies.  CT ABDOMEN AND PELVIS FINDINGS  The liver demonstrates no  focal abnormality. There is no intrahepatic or extrahepatic biliary ductal dilatation. There is cholelithiasis. The common bile duct is dilated measuring 14.4 mm in diameter. The spleen demonstrates no focal abnormality.There is a 14 x 10 mm hypodense interpolar left renal mass most consistent with a cyst. The right kidney, adrenal glands and pancreas are normal. The bladder is unremarkable.  The stomach, duodenum, small intestine, and large intestine demonstrate no contrast extravasation or dilatation. There is no pneumoperitoneum, pneumatosis, or portal venous gas. There is no abdominal or pelvic free fluid. There is no lymphadenopathy.  The abdominal aorta is normal in caliber with atherosclerosis.  There is a L3 compression fracture. There is posterior lumbar decompression from L2 through L4 with postsurgical changes in the posterior paraspinal musculature. The small lesions demonstrated on recent thoracolumbar spine MRI are not well delineated.  IMPRESSION: 1. No evidence of intrathoracic metastatic disease. No evidence of abdominal or pelvic metastatic disease. 2. Osseous malignancy most severely involving the T10 vertebral body which may reflect metastatic disease versus multiple myeloma. The smaller lesions demonstrated on recent thoracolumbar spine MRI are not well delineated. 3. Cholelithiasis with mild common  bile duct dilatation. No definite choledocholithiasis.   Electronically Signed   By: Kathreen Devoid   On: 11/05/2013 11:55    Microbiology: No results found for this or any previous visit (from the past 240 hour(s)).   Labs: Basic Metabolic Panel:  Recent Labs Lab 11/04/13 1458  CREATININE 0.65   Liver Function Tests: No results found for this basename: AST, ALT, ALKPHOS, BILITOT, PROT, ALBUMIN,  in the last 168 hours No results found for this basename: LIPASE, AMYLASE,  in the last 168 hours No results found for this basename: AMMONIA,  in the last 168 hours CBC:  Recent Labs Lab 11/04/13 1458  WBC 7.1  HGB 11.9*  HCT 36.1  MCV 95.8  PLT 240   Cardiac Enzymes: No results found for this basename: CKTOTAL, CKMB, CKMBINDEX, TROPONINI,  in the last 168 hours BNP: BNP (last 3 results) No results found for this basename: PROBNP,  in the last 8760 hours CBG: No results found for this basename: GLUCAP,  in the last 168 hours     Signed:  Kelvin Cellar  [facility] 11/07/2013, 11:38 AM

## 2013-11-07 NOTE — Progress Notes (Signed)
CARE MANAGEMENT NOTE 11/07/2013  Patient:  KARINNE, SCHMADER   Account Number:  [id number]  Date Initiated:  11/05/2013  Documentation initiated by:  [facility]  Subjective/Objective Assessment:   54 year year old female admitted with paraplegia.     Action/Plan:   From home.   Anticipated DC Date:  11/08/2013   Anticipated DC Plan:  [place]  CM consult      PAC Choice  HOSPICE   Choice offered to / List presented to:  C-2 HC POA / Guardian        HH arranged  HH-1 RN      [place]   Status of service:  Completed, signed off Medicare Important Message given?  NA - LOS <3 / Initial given by admissions (If response is "NO", the following Medicare IM given date fields will be blank) Date Medicare IM given:  11/07/2013 Medicare IM given by:  [facility] Date Additional Medicare IM given:   Additional Medicare IM given by:    Discharge Disposition:  [place]  Per UR Regulation:  Reviewed for med. necessity/level of care/duration of stay  If discussed at [place] of Stay Meetings, dates discussed:    Comments:  11/07/2013 1400 NCM spoke to [place], Wendall Mola. Offered choice for Hospice. He requested [facility]. Contacted [facility] and spoke to [employer], [place]. Requested dc summary, orders, facesheet and H&P be faxed to [place]. Faxed paperwork. Hospice RN to follow up with nephew on admit date to services. Pt has 24 hour Caregivers in the home and no DME requested. Jonnie Finner RN CCM Case Mgmt phone [phone]

## 2013-11-07 NOTE — Progress Notes (Signed)
Discharge instructions discussed with patient and sitter. Ample time allowed for questions . Hospice arranged by case worker Elmo Putt and she states she has communicated information to POA. Pt and sitter state they have all needed dme.

## 2013-11-09 ENCOUNTER — Telehealth: Payer: Self-pay | Admitting: Medical Oncology

## 2013-11-09 ENCOUNTER — Telehealth: Payer: Self-pay | Admitting: Family

## 2013-11-09 LAB — UIFE/LIGHT CHAINS/TP QN, 24-HR UR
ALBUMIN, U: DETECTED
ALPHA 2 UR: DETECTED — AB
Alpha 1, Urine: DETECTED — AB
BETA UR: DETECTED — AB
FREE LAMBDA LT CHAINS, UR: 1.33 mg/dL — AB (ref 0.02–0.67)
Free Kappa Lt Chains,Ur: 1.28 mg/dL (ref 0.14–2.42)
Free Kappa/Lambda Ratio: 0.96 ratio — ABNORMAL LOW (ref 2.04–10.37)
GAMMA UR: DETECTED — AB
Total Protein, Urine: 3.1 mg/dL

## 2013-11-09 LAB — IMMUNOFIXATION ELECTROPHORESIS
IGA: 184 mg/dL (ref 69–380)
IGM, SERUM: 41 mg/dL — AB (ref 52–322)
IgG (Immunoglobin G), Serum: 2800 mg/dL — ABNORMAL HIGH (ref 690–1700)
TOTAL PROTEIN ELP: 7.4 g/dL (ref 6.0–8.3)

## 2013-11-09 LAB — PROTEIN ELECTROPHORESIS, SERUM
ALPHA-1-GLOBULIN: 4 % (ref 2.9–4.9)
Albumin ELP: 39.8 % — ABNORMAL LOW (ref 55.8–66.1)
Alpha-2-Globulin: 9.7 % (ref 7.1–11.8)
BETA 2: 3.1 % — AB (ref 3.2–6.5)
Beta Globulin: 8.9 % — ABNORMAL HIGH (ref 4.7–7.2)
GAMMA GLOBULIN: 34.5 % — AB (ref 11.1–18.8)
M-SPIKE, %: 2.19 g/dL
Total Protein ELP: 7.2 g/dL (ref 6.0–8.3)

## 2013-11-09 NOTE — Telephone Encounter (Signed)
Notified Vickie.

## 2013-11-09 NOTE — Telephone Encounter (Signed)
Vickie from Hospice would like to know if Lenna Sciara would be this patients Attending Provider while patient is under Hospice Care. Also, Vickie states that if Lenna Sciara agrees and is not available that the Hospice doctors could always write scripts and go to patients home.

## 2013-11-09 NOTE — Telephone Encounter (Signed)
Received a call from [place] stating the patient will be admitted to hospice services.She asked if Dr. Juliann Mule will be the attending and if he is ok with the hospice physicians helping with symptom management. Per Dr. Juliann Mule he will be attending and he would appreciated help with symptom management.

## 2013-11-09 NOTE — Telephone Encounter (Signed)
Yes

## 2013-11-11 ENCOUNTER — Telehealth: Payer: Self-pay | Admitting: Internal Medicine

## 2013-11-11 NOTE — Telephone Encounter (Signed)
I spoke to Katherine Rubio at [phone] regarding the results of Katherine Rubio's blood test.  It revealed an M-spike with Monoclonal IgG Kappa concerning for multiple myeloma.  We would require a bone marrow biopsy to define extent of plasma involvement but patient is in hospice and requests no additional procedures.  He was concerned about her diagnosis impact on her other family members.  The  Diagnosis remains unclear and I recommended continue cancer screening per guidelines, ie. Colonoscopy at age 15 and regular follow up.  He voiced understanding.

## 2013-11-13 ENCOUNTER — Telehealth: Payer: Self-pay | Admitting: Family

## 2013-11-13 NOTE — Telephone Encounter (Signed)
Spoke with hospice nurse.  She reprots a stage 2 pressure ulcer-no drainage. They are treating with duoderm. Notes pt awake, alert afebrile pain 4/10- she will administer dose of tramadol in addition to standing tylenol. Notes pt reporting left low back pain and care givers noted stong urinary odor. She would  Like order to sent urine culture. Verbal order given.

## 2013-11-23 ENCOUNTER — Telehealth: Payer: Self-pay | Admitting: Family

## 2013-11-23 MED ORDER — CIPROFLOXACIN HCL 250 MG PO TABS: 250.0000 mg | ORAL_TABLET | Freq: Two times a day (BID) | ORAL | Status: DC

## 2013-11-23 NOTE — Telephone Encounter (Signed)
Reviewed urine culture results- + for UTI.  Please notify family and hospice RN that I am sending rx for cipro to her pharmacy.

## 2013-11-23 NOTE — Telephone Encounter (Signed)
Notified pt's nephew and he voices understanding. Notified Vickie at hospice [phone].

## 2013-11-24 ENCOUNTER — Telehealth: Payer: Self-pay | Admitting: *Deleted

## 2013-11-24 NOTE — Telephone Encounter (Signed)
FYI:  Received message from Patty with Hospice that pt and family have decided to go forward with DNR and they will have their physician at Hospice sign the form.

## 2013-11-24 NOTE — Telephone Encounter (Signed)
Noted  

## 2013-11-27 ENCOUNTER — Encounter: Payer: Self-pay | Admitting: Family

## 2013-11-30 NOTE — Consult Note (Signed)
I have reviewed this case with our NP and agree with the Assessment and Plan as stated.  Nehemiah Montee L. Ameya Kutz, MD MBA The Palliative Medicine Team at [place] Team Phone: [phone] Pager: [phone]   

## 2013-12-01 ENCOUNTER — Telehealth: Payer: Self-pay | Admitting: *Deleted

## 2013-12-01 NOTE — Telephone Encounter (Signed)
Received message from [facility] with Hospice stating pt completed three days of Cipro but continues to have strong odor to her urine. She wants to know if they can get extension on Cipro rx?  Also, pt's family report that pt is having intermittent anxiety and she is requesting order for lorazepam 0.5mg  every 4 hours as needed for anxiety. Please send rxs to [facility].  Please advise?

## 2013-12-01 NOTE — Telephone Encounter (Signed)
OK to send meds as pended below please.

## 2013-12-02 MED ORDER — CIPROFLOXACIN HCL 250 MG PO TABS: 250.0000 mg | ORAL_TABLET | Freq: Two times a day (BID) | ORAL | Status: AC

## 2013-12-02 MED ORDER — LORAZEPAM 0.5 MG PO TABS: 0.5000 mg | ORAL_TABLET | ORAL | Status: AC | PRN

## 2013-12-02 NOTE — Telephone Encounter (Signed)
Rxs called to pharmacy voicemail. Notified Carrelle at [facility] as Chong Sicilian was not available.

## 2014-01-06 ENCOUNTER — Telehealth: Payer: Self-pay | Admitting: Family

## 2014-01-06 NOTE — Telephone Encounter (Signed)
Caller name:Patty Beard Relation to QK:MMNO Call back number:[phone] Pharmacy:  Reason for call: Patty from hospice pallative care calling in regards to pt, wanted to let Lenna Sciara know that pt is no longer taking her prilosec, zofram-odt, or mirtazapine. States pt has transitioned from her hallucinations.

## 2014-01-06 NOTE — Telephone Encounter (Signed)
Med list updated

## 2014-01-25 ENCOUNTER — Telehealth: Payer: Self-pay | Admitting: *Deleted

## 2014-01-25 NOTE — Telephone Encounter (Signed)
Pt Date of Death 01-30-2014, at decedent's home, Time of Death approx 3:44pm.  07/29/8889 Received Certificate of Death from [facility] for [place].

## 2014-01-26 NOTE — Telephone Encounter (Signed)
Certificate of Death completed and signed by Debbrah Alar. Called Wendall Mola with [facility] at [phone] to pick up at our front office. JG//CMA

## 2014-01-28 ENCOUNTER — Telehealth: Payer: Self-pay

## 2014-01-28 NOTE — Telephone Encounter (Signed)
Patient died at Home per Iver Nestle

## 2014-02-21 DEATH — deceased

## 2014-10-06 IMAGING — CT CT ANGIO CHEST
2 of 6 series · 19 of 46 positions shown · IV contrast (APPLIED)
Comparison: None

CLINICAL DATA: Chest pain, back pain near shoulder blades, question
pulmonary embolism versus dissection, history hypertension,
hyperlipidemia

CT ANGIOGRAPHY CHEST
TECHNIQUE: Multidetector CT imaging of the chest using the
standard protocol during bolus administration of intravenous
contrast. Multiplanar reconstructed images including MIPs were
obtained and reviewed to evaluate the vascular anatomy.
Contrast: 100mL OMNIPAQUE IOHEXOL 350 MG/ML SOLN

[Series 6: pulm embolism 1.0 b25f thin · axial · 0.59mm/px · z∈[+1534,+1742]mm · 16 of 228 slices shown]
[im 10/228  lung]
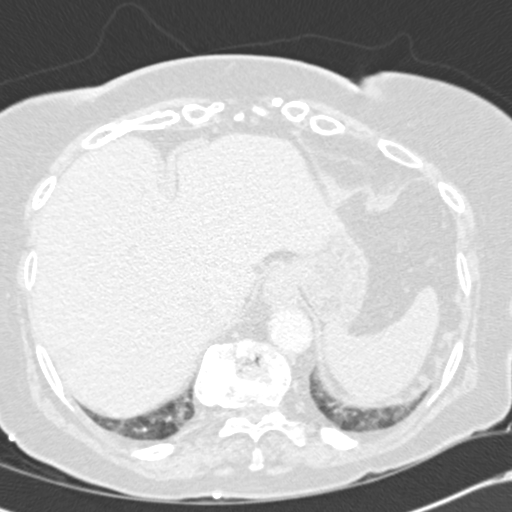
[im 30/228  soft-tissue]
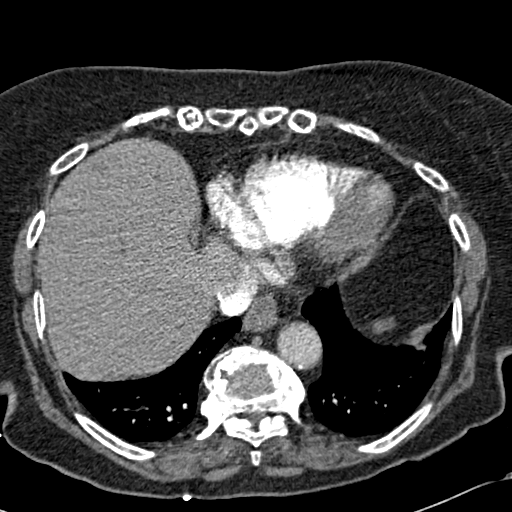
[im 40/228  lung]
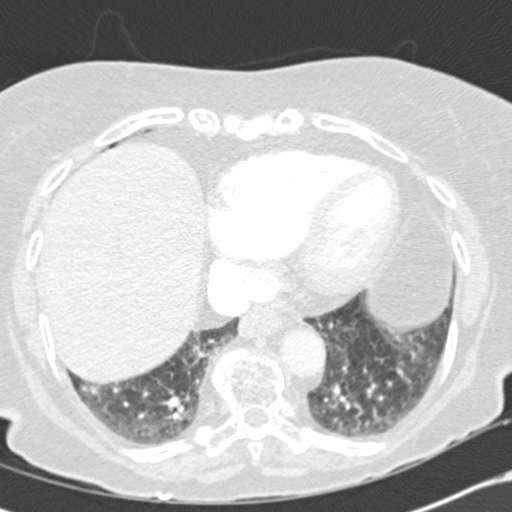
[im 50/228  soft-tissue]
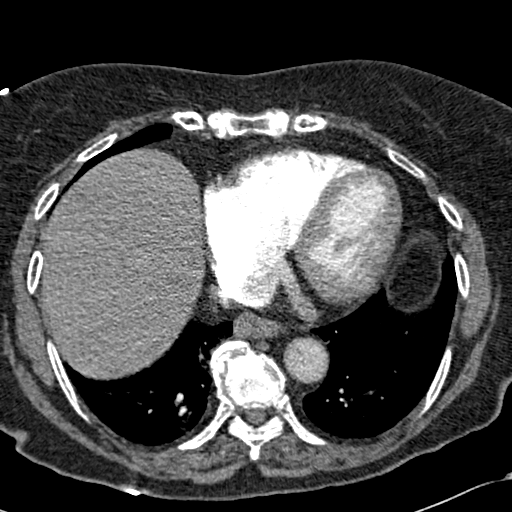
[im 70/228  lung]
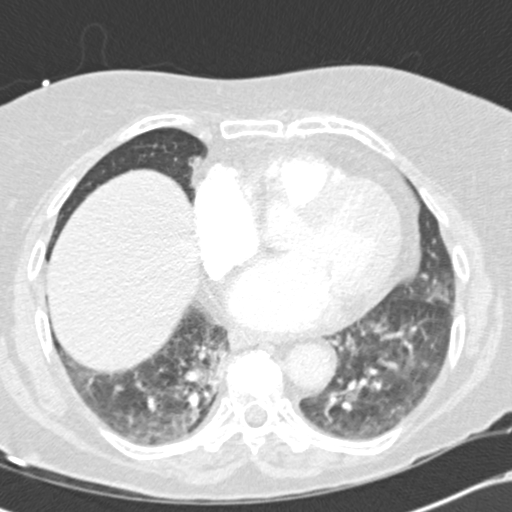
[im 79/228  soft-tissue]
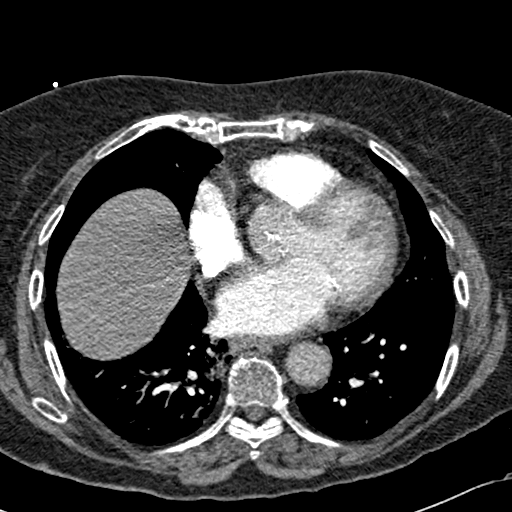
[im 89/228  lung]
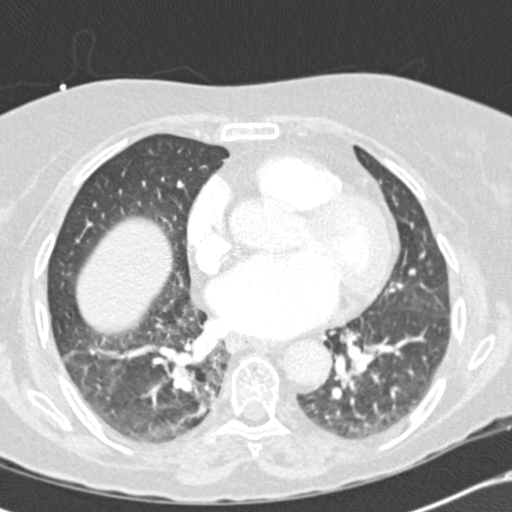
[im 109/228  soft-tissue]
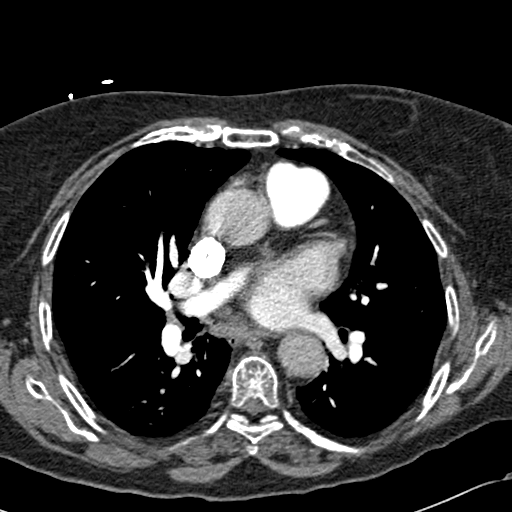
[im 119/228  lung]
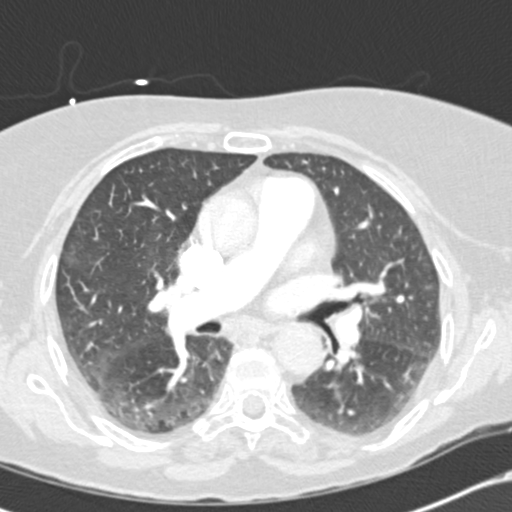
[im 139/228  soft-tissue]
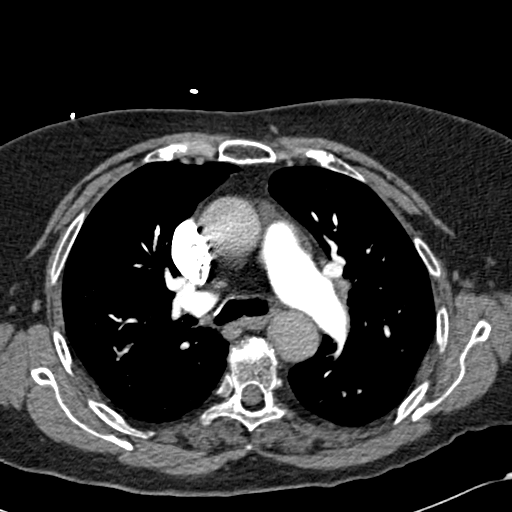
[im 149/228  lung]
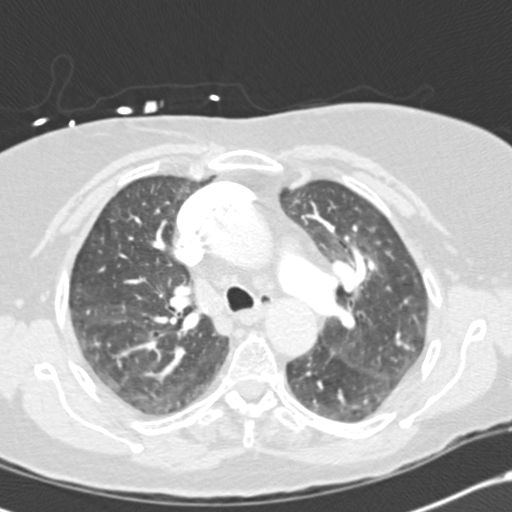
[im 158/228  soft-tissue]
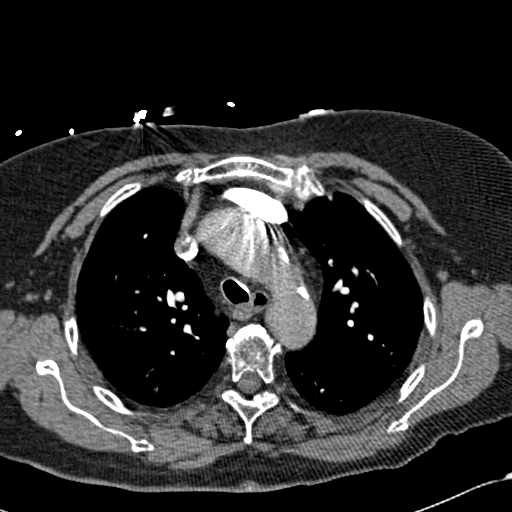
[im 178/228  lung]
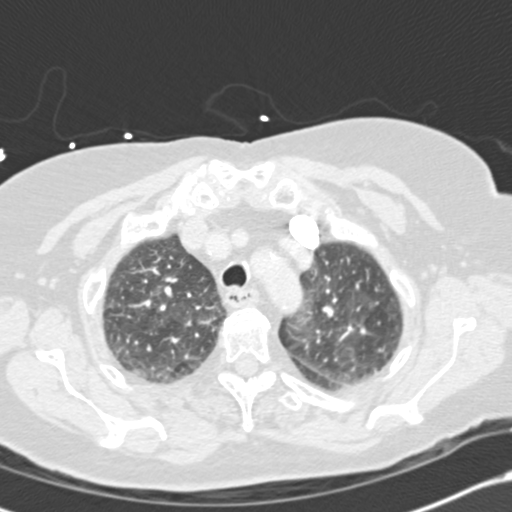
[im 188/228  soft-tissue]
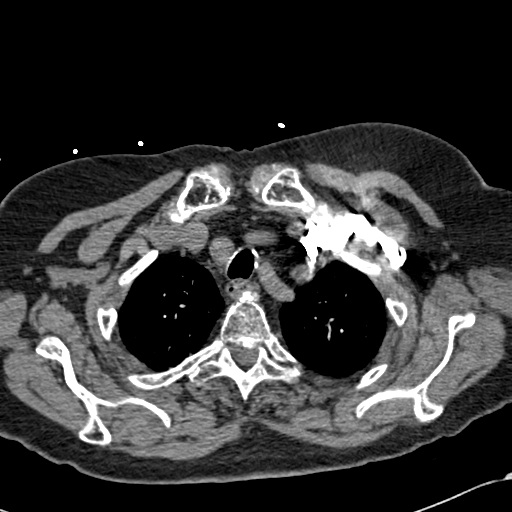
[im 198/228  lung]
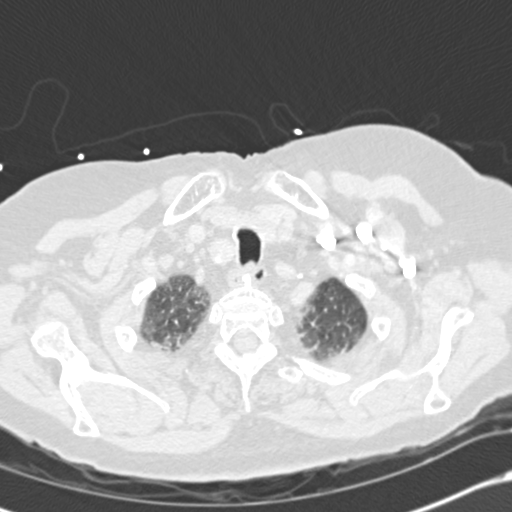
[im 218/228  soft-tissue]
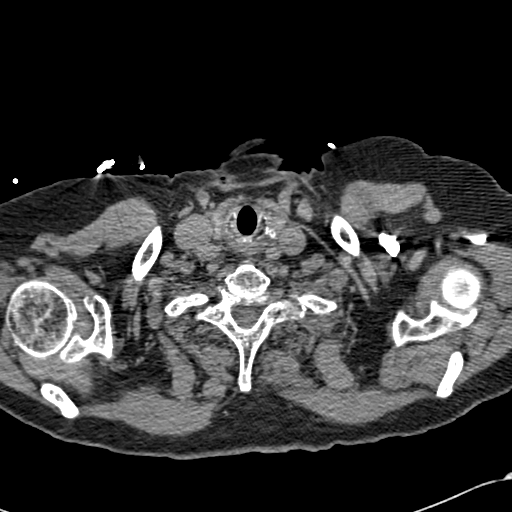

[Series 602: cor · coronal · 0.59mm/px · 3 of 105 slices shown]
[im 27/105  soft-tissue]
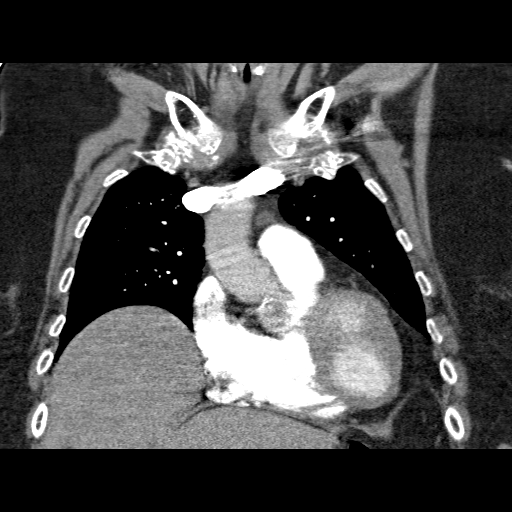
[im 53/105  soft-tissue]
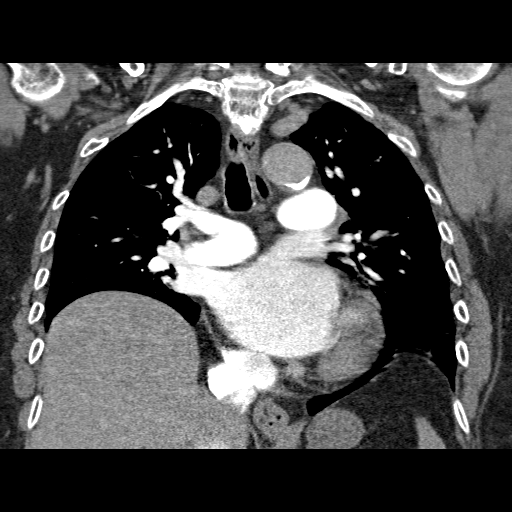
[im 79/105  soft-tissue]
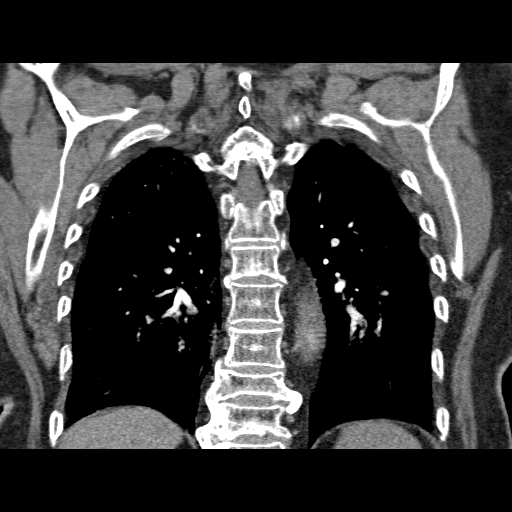

[19 of 46 positions shown; findings below may reference images not displayed]

FINDINGS: Scattered atherosclerotic calcifications aorta and coronary
arteries.
Aorta normal caliber without aneurysm or gross evidence of
dissection, though the aorta is suboptimally opacified.
Pulmonary arteries well opacified and patent.
No evidence of pulmonary embolism.
Few scattered normal-sized mediastinal lymph nodes without
adenopathy.
Visualized portion of upper abdomen demonstrates mild intrahepatic
biliary dilatation.

Atelectasis at lung bases.
Scattered respiratory motion artifacts.
Minimal nonspecific ground-glass infiltrate identified in left
upper lobe.
Remaining lungs clear.
No pleural effusion or pneumothorax.
Osseous demineralization.
IMPRESSION: No evidence of pulmonary embolism.
Scattered atherosclerotic disease without gross evidence of aortic
dissection.
Bibasilar atelectasis with minimal nonspecific infiltrate in the
left upper lobe.
Only a small portion of the liver is imaged and demonstrates
central intrahepatic biliary dilatation; recommend correlation with
patient history and LFTs.
